# Patient Record
Sex: Male | Born: 1945 | Race: White | Hispanic: No | Smoking: Former smoker
Health system: Southern US, Community
[De-identification: ages and names within clinical notes are randomized; demographics above are authoritative.]

## PROBLEM LIST (undated history)

## (undated) DIAGNOSIS — R140 Abdominal distension (gaseous): Secondary | ICD-10-CM

## (undated) DIAGNOSIS — F329 Major depressive disorder, single episode, unspecified: Secondary | ICD-10-CM

## (undated) DIAGNOSIS — D649 Anemia, unspecified: Secondary | ICD-10-CM

## (undated) DIAGNOSIS — N319 Neuromuscular dysfunction of bladder, unspecified: Secondary | ICD-10-CM

## (undated) DIAGNOSIS — J90 Pleural effusion, not elsewhere classified: Secondary | ICD-10-CM

## (undated) DIAGNOSIS — J189 Pneumonia, unspecified organism: Secondary | ICD-10-CM

## (undated) DIAGNOSIS — F419 Anxiety disorder, unspecified: Secondary | ICD-10-CM

## (undated) DIAGNOSIS — E119 Type 2 diabetes mellitus without complications: Secondary | ICD-10-CM

## (undated) DIAGNOSIS — F4310 Post-traumatic stress disorder, unspecified: Secondary | ICD-10-CM

## (undated) DIAGNOSIS — F32A Depression, unspecified: Secondary | ICD-10-CM

## (undated) DIAGNOSIS — K3184 Gastroparesis: Secondary | ICD-10-CM

## (undated) DIAGNOSIS — G4700 Insomnia, unspecified: Secondary | ICD-10-CM

## (undated) DIAGNOSIS — G92 Toxic encephalopathy: Secondary | ICD-10-CM

## (undated) DIAGNOSIS — J069 Acute upper respiratory infection, unspecified: Secondary | ICD-10-CM

## (undated) DIAGNOSIS — K219 Gastro-esophageal reflux disease without esophagitis: Secondary | ICD-10-CM

## (undated) DIAGNOSIS — G8250 Quadriplegia, unspecified: Secondary | ICD-10-CM

## (undated) DIAGNOSIS — B999 Unspecified infectious disease: Secondary | ICD-10-CM

## (undated) DIAGNOSIS — L8994 Pressure ulcer of unspecified site, stage 4: Secondary | ICD-10-CM

## (undated) DIAGNOSIS — B20 Human immunodeficiency virus [HIV] disease: Secondary | ICD-10-CM

## (undated) DIAGNOSIS — R1312 Dysphagia, oropharyngeal phase: Secondary | ICD-10-CM

## (undated) DIAGNOSIS — E876 Hypokalemia: Secondary | ICD-10-CM

## (undated) DIAGNOSIS — G8929 Other chronic pain: Secondary | ICD-10-CM

## (undated) DIAGNOSIS — Z9911 Dependence on respirator [ventilator] status: Secondary | ICD-10-CM

## (undated) DIAGNOSIS — F4321 Adjustment disorder with depressed mood: Secondary | ICD-10-CM

## (undated) DIAGNOSIS — J9600 Acute respiratory failure, unspecified whether with hypoxia or hypercapnia: Secondary | ICD-10-CM

## (undated) DIAGNOSIS — I10 Essential (primary) hypertension: Secondary | ICD-10-CM

## (undated) DIAGNOSIS — IMO0001 Reserved for inherently not codable concepts without codable children: Secondary | ICD-10-CM

## (undated) DIAGNOSIS — G929 Unspecified toxic encephalopathy: Secondary | ICD-10-CM

## (undated) HISTORY — PX: TRACHEOSTOMY: SUR1362

## (undated) HISTORY — PX: CERVICAL SPINE SURGERY: SHX589

## (undated) HISTORY — PX: GASTROSTOMY: SHX151

---

## 2015-05-23 ENCOUNTER — Inpatient Hospital Stay (HOSPITAL_COMMUNITY): Payer: Medicare Other

## 2015-05-23 ENCOUNTER — Encounter (HOSPITAL_COMMUNITY): Payer: Self-pay | Admitting: Emergency Medicine

## 2015-05-23 ENCOUNTER — Emergency Department (HOSPITAL_COMMUNITY): Payer: Medicare Other

## 2015-05-23 ENCOUNTER — Inpatient Hospital Stay (HOSPITAL_COMMUNITY)
Admission: EM | Admit: 2015-05-23 | Discharge: 2015-06-28 | DRG: 974 | Disposition: A | Discharge status: Skilled Nursing Facility | Payer: Medicare Other | Attending: Internal Medicine | Admitting: Internal Medicine

## 2015-05-23 DIAGNOSIS — J151 Pneumonia due to Pseudomonas: Principal | ICD-10-CM | POA: Diagnosis present

## 2015-05-23 DIAGNOSIS — R0902 Hypoxemia: Secondary | ICD-10-CM

## 2015-05-23 DIAGNOSIS — B965 Pseudomonas (aeruginosa) (mallei) (pseudomallei) as the cause of diseases classified elsewhere: Secondary | ICD-10-CM | POA: Diagnosis not present

## 2015-05-23 DIAGNOSIS — D638 Anemia in other chronic diseases classified elsewhere: Secondary | ICD-10-CM | POA: Insufficient documentation

## 2015-05-23 DIAGNOSIS — J9811 Atelectasis: Secondary | ICD-10-CM | POA: Diagnosis not present

## 2015-05-23 DIAGNOSIS — Y848 Other medical procedures as the cause of abnormal reaction of the patient, or of later complication, without mention of misadventure at the time of the procedure: Secondary | ICD-10-CM | POA: Diagnosis not present

## 2015-05-23 DIAGNOSIS — Z794 Long term (current) use of insulin: Secondary | ICD-10-CM | POA: Diagnosis not present

## 2015-05-23 DIAGNOSIS — I10 Essential (primary) hypertension: Secondary | ICD-10-CM | POA: Diagnosis not present

## 2015-05-23 DIAGNOSIS — K9423 Gastrostomy malfunction: Secondary | ICD-10-CM | POA: Diagnosis not present

## 2015-05-23 DIAGNOSIS — M542 Cervicalgia: Secondary | ICD-10-CM | POA: Insufficient documentation

## 2015-05-23 DIAGNOSIS — J95851 Ventilator associated pneumonia: Secondary | ICD-10-CM | POA: Diagnosis not present

## 2015-05-23 DIAGNOSIS — Z9911 Dependence on respirator [ventilator] status: Secondary | ICD-10-CM | POA: Diagnosis not present

## 2015-05-23 DIAGNOSIS — R0681 Apnea, not elsewhere classified: Secondary | ICD-10-CM | POA: Diagnosis not present

## 2015-05-23 DIAGNOSIS — E46 Unspecified protein-calorie malnutrition: Secondary | ICD-10-CM | POA: Diagnosis not present

## 2015-05-23 DIAGNOSIS — F419 Anxiety disorder, unspecified: Secondary | ICD-10-CM | POA: Diagnosis not present

## 2015-05-23 DIAGNOSIS — R680 Hypothermia, not associated with low environmental temperature: Secondary | ICD-10-CM | POA: Diagnosis not present

## 2015-05-23 DIAGNOSIS — J9601 Acute respiratory failure with hypoxia: Secondary | ICD-10-CM | POA: Diagnosis not present

## 2015-05-23 DIAGNOSIS — M7989 Other specified soft tissue disorders: Secondary | ICD-10-CM | POA: Insufficient documentation

## 2015-05-23 DIAGNOSIS — Z515 Encounter for palliative care: Secondary | ICD-10-CM | POA: Insufficient documentation

## 2015-05-23 DIAGNOSIS — L8990 Pressure ulcer of unspecified site, unspecified stage: Secondary | ICD-10-CM | POA: Insufficient documentation

## 2015-05-23 DIAGNOSIS — R001 Bradycardia, unspecified: Secondary | ICD-10-CM | POA: Diagnosis not present

## 2015-05-23 DIAGNOSIS — R4182 Altered mental status, unspecified: Secondary | ICD-10-CM | POA: Diagnosis present

## 2015-05-23 DIAGNOSIS — L89612 Pressure ulcer of right heel, stage 2: Secondary | ICD-10-CM | POA: Diagnosis present

## 2015-05-23 DIAGNOSIS — I959 Hypotension, unspecified: Secondary | ICD-10-CM | POA: Diagnosis not present

## 2015-05-23 DIAGNOSIS — J158 Pneumonia due to other specified bacteria: Secondary | ICD-10-CM | POA: Diagnosis not present

## 2015-05-23 DIAGNOSIS — Z87891 Personal history of nicotine dependence: Secondary | ICD-10-CM

## 2015-05-23 DIAGNOSIS — E871 Hypo-osmolality and hyponatremia: Secondary | ICD-10-CM | POA: Diagnosis not present

## 2015-05-23 DIAGNOSIS — Z1624 Resistance to multiple antibiotics: Secondary | ICD-10-CM

## 2015-05-23 DIAGNOSIS — F329 Major depressive disorder, single episode, unspecified: Secondary | ICD-10-CM | POA: Diagnosis not present

## 2015-05-23 DIAGNOSIS — J9610 Chronic respiratory failure, unspecified whether with hypoxia or hypercapnia: Secondary | ICD-10-CM | POA: Diagnosis not present

## 2015-05-23 DIAGNOSIS — J9819 Other pulmonary collapse: Secondary | ICD-10-CM | POA: Insufficient documentation

## 2015-05-23 DIAGNOSIS — J9600 Acute respiratory failure, unspecified whether with hypoxia or hypercapnia: Secondary | ICD-10-CM | POA: Insufficient documentation

## 2015-05-23 DIAGNOSIS — Z21 Asymptomatic human immunodeficiency virus [HIV] infection status: Secondary | ICD-10-CM | POA: Diagnosis not present

## 2015-05-23 DIAGNOSIS — E11649 Type 2 diabetes mellitus with hypoglycemia without coma: Secondary | ICD-10-CM | POA: Diagnosis not present

## 2015-05-23 DIAGNOSIS — B20 Human immunodeficiency virus [HIV] disease: Secondary | ICD-10-CM | POA: Diagnosis present

## 2015-05-23 DIAGNOSIS — T17990A Other foreign object in respiratory tract, part unspecified in causing asphyxiation, initial encounter: Secondary | ICD-10-CM | POA: Diagnosis not present

## 2015-05-23 DIAGNOSIS — Z930 Tracheostomy status: Secondary | ICD-10-CM | POA: Diagnosis not present

## 2015-05-23 DIAGNOSIS — J9611 Chronic respiratory failure with hypoxia: Secondary | ICD-10-CM | POA: Diagnosis not present

## 2015-05-23 DIAGNOSIS — T40605A Adverse effect of unspecified narcotics, initial encounter: Secondary | ICD-10-CM | POA: Diagnosis not present

## 2015-05-23 DIAGNOSIS — J90 Pleural effusion, not elsewhere classified: Secondary | ICD-10-CM | POA: Diagnosis not present

## 2015-05-23 DIAGNOSIS — A498 Other bacterial infections of unspecified site: Secondary | ICD-10-CM | POA: Diagnosis not present

## 2015-05-23 DIAGNOSIS — J811 Chronic pulmonary edema: Secondary | ICD-10-CM

## 2015-05-23 DIAGNOSIS — E876 Hypokalemia: Secondary | ICD-10-CM | POA: Diagnosis not present

## 2015-05-23 DIAGNOSIS — J9621 Acute and chronic respiratory failure with hypoxia: Secondary | ICD-10-CM | POA: Diagnosis not present

## 2015-05-23 DIAGNOSIS — N390 Urinary tract infection, site not specified: Secondary | ICD-10-CM | POA: Diagnosis present

## 2015-05-23 DIAGNOSIS — J9690 Respiratory failure, unspecified, unspecified whether with hypoxia or hypercapnia: Secondary | ICD-10-CM | POA: Insufficient documentation

## 2015-05-23 DIAGNOSIS — Y95 Nosocomial condition: Secondary | ICD-10-CM | POA: Diagnosis present

## 2015-05-23 DIAGNOSIS — Z931 Gastrostomy status: Secondary | ICD-10-CM | POA: Diagnosis not present

## 2015-05-23 DIAGNOSIS — L89154 Pressure ulcer of sacral region, stage 4: Secondary | ICD-10-CM | POA: Diagnosis not present

## 2015-05-23 DIAGNOSIS — L89622 Pressure ulcer of left heel, stage 2: Secondary | ICD-10-CM | POA: Diagnosis present

## 2015-05-23 DIAGNOSIS — A419 Sepsis, unspecified organism: Secondary | ICD-10-CM | POA: Diagnosis not present

## 2015-05-23 DIAGNOSIS — G8250 Quadriplegia, unspecified: Secondary | ICD-10-CM | POA: Diagnosis present

## 2015-05-23 DIAGNOSIS — J9620 Acute and chronic respiratory failure, unspecified whether with hypoxia or hypercapnia: Secondary | ICD-10-CM | POA: Diagnosis present

## 2015-05-23 DIAGNOSIS — G8929 Other chronic pain: Secondary | ICD-10-CM | POA: Diagnosis not present

## 2015-05-23 DIAGNOSIS — Z8619 Personal history of other infectious and parasitic diseases: Secondary | ICD-10-CM | POA: Diagnosis not present

## 2015-05-23 DIAGNOSIS — J189 Pneumonia, unspecified organism: Secondary | ICD-10-CM | POA: Diagnosis not present

## 2015-05-23 DIAGNOSIS — M62838 Other muscle spasm: Secondary | ICD-10-CM | POA: Diagnosis not present

## 2015-05-23 DIAGNOSIS — I8291 Chronic embolism and thrombosis of unspecified vein: Secondary | ICD-10-CM | POA: Diagnosis not present

## 2015-05-23 DIAGNOSIS — G92 Toxic encephalopathy: Secondary | ICD-10-CM | POA: Diagnosis present

## 2015-05-23 DIAGNOSIS — G4700 Insomnia, unspecified: Secondary | ICD-10-CM | POA: Diagnosis present

## 2015-05-23 DIAGNOSIS — R0603 Acute respiratory distress: Secondary | ICD-10-CM | POA: Insufficient documentation

## 2015-05-23 DIAGNOSIS — B954 Other streptococcus as the cause of diseases classified elsewhere: Secondary | ICD-10-CM | POA: Diagnosis not present

## 2015-05-23 DIAGNOSIS — R0600 Dyspnea, unspecified: Secondary | ICD-10-CM | POA: Diagnosis not present

## 2015-05-23 HISTORY — DX: Pressure ulcer of unspecified site, stage 4: L89.94

## 2015-05-23 HISTORY — DX: Human immunodeficiency virus (HIV) disease: B20

## 2015-05-23 HISTORY — DX: Essential (primary) hypertension: I10

## 2015-05-23 HISTORY — DX: Type 2 diabetes mellitus without complications: E11.9

## 2015-05-23 HISTORY — DX: Depression, unspecified: F32.A

## 2015-05-23 HISTORY — DX: Major depressive disorder, single episode, unspecified: F32.9

## 2015-05-23 HISTORY — DX: Reserved for inherently not codable concepts without codable children: IMO0001

## 2015-05-23 HISTORY — DX: Gastro-esophageal reflux disease without esophagitis: K21.9

## 2015-05-23 HISTORY — DX: Quadriplegia, unspecified: G82.50

## 2015-05-23 LAB — URINALYSIS, ROUTINE W REFLEX MICROSCOPIC
Bilirubin Urine: NEGATIVE
Glucose, UA: NEGATIVE mg/dL
Ketones, ur: NEGATIVE mg/dL
NITRITE: POSITIVE — AB
PH: 8 (ref 5.0–8.0)
Protein, ur: 100 mg/dL — AB
SPECIFIC GRAVITY, URINE: 1.015 (ref 1.005–1.030)
Urobilinogen, UA: 1 mg/dL (ref 0.0–1.0)

## 2015-05-23 LAB — CBC WITH DIFFERENTIAL/PLATELET
Basophils Absolute: 0 10*3/uL (ref 0.0–0.1)
Basophils Relative: 0 %
EOS PCT: 2 %
Eosinophils Absolute: 0.2 10*3/uL (ref 0.0–0.7)
HEMATOCRIT: 32.7 % — AB (ref 39.0–52.0)
HEMOGLOBIN: 9.3 g/dL — AB (ref 13.0–17.0)
LYMPHS ABS: 0.9 10*3/uL (ref 0.7–4.0)
LYMPHS PCT: 8 %
MCH: 25.2 pg — ABNORMAL LOW (ref 26.0–34.0)
MCHC: 28.4 g/dL — ABNORMAL LOW (ref 30.0–36.0)
MCV: 88.6 fL (ref 78.0–100.0)
MONOS PCT: 6 %
Monocytes Absolute: 0.7 10*3/uL (ref 0.1–1.0)
NEUTROS PCT: 84 %
Neutro Abs: 9.6 10*3/uL — ABNORMAL HIGH (ref 1.7–7.7)
Platelets: 275 10*3/uL (ref 150–400)
RBC: 3.69 MIL/uL — AB (ref 4.22–5.81)
RDW: 15.1 % (ref 11.5–15.5)
WBC: 11.4 10*3/uL — AB (ref 4.0–10.5)

## 2015-05-23 LAB — COMPREHENSIVE METABOLIC PANEL
ALT: 15 U/L — ABNORMAL LOW (ref 17–63)
AST: 15 U/L (ref 15–41)
Albumin: 2.7 g/dL — ABNORMAL LOW (ref 3.5–5.0)
Alkaline Phosphatase: 57 U/L (ref 38–126)
Anion gap: 11 (ref 5–15)
BUN: 23 mg/dL — AB (ref 6–20)
CHLORIDE: 101 mmol/L (ref 101–111)
CO2: 31 mmol/L (ref 22–32)
Calcium: 9.8 mg/dL (ref 8.9–10.3)
Creatinine, Ser: 0.84 mg/dL (ref 0.61–1.24)
GFR calc Af Amer: >60 mL/min (ref >60)
Glucose, Bld: 121 mg/dL — ABNORMAL HIGH (ref 65–99)
POTASSIUM: 3.5 mmol/L (ref 3.5–5.1)
Sodium: 143 mmol/L (ref 135–145)
Total Bilirubin: 0.4 mg/dL (ref 0.3–1.2)
Total Protein: 6.5 g/dL (ref 6.5–8.1)

## 2015-05-23 LAB — GLUCOSE, CAPILLARY
GLUCOSE-CAPILLARY: 150 mg/dL — AB (ref 65–99)
GLUCOSE-CAPILLARY: 140 mg/dL — AB (ref 65–99)

## 2015-05-23 LAB — MRSA PCR SCREENING: MRSA by PCR: NEGATIVE

## 2015-05-23 LAB — URINE MICROSCOPIC-ADD ON

## 2015-05-23 LAB — PROCALCITONIN: Procalcitonin: 0.3 ng/mL

## 2015-05-23 LAB — TROPONIN I

## 2015-05-23 LAB — I-STAT CG4 LACTIC ACID, ED: LACTIC ACID, VENOUS: 1.52 mmol/L (ref 0.5–2.0)

## 2015-05-23 MED ORDER — VANCOMYCIN HCL IN DEXTROSE 1-5 GM/200ML-% IV SOLN
1000.0000 mg | Freq: Once | INTRAVENOUS | Status: AC
  Administered 2015-05-23: 1000 mg via INTRAVENOUS
  Filled 2015-05-23: qty 200

## 2015-05-23 MED ORDER — CHLORHEXIDINE GLUCONATE 0.12% ORAL RINSE (MEDLINE KIT)
15.0000 mL | Freq: Two times a day (BID) | OROMUCOSAL | Status: DC
  Administered 2015-05-24 – 2015-06-27 (×66): 15 mL via OROMUCOSAL
  Filled 2015-05-23: qty 15

## 2015-05-23 MED ORDER — PIPERACILLIN-TAZOBACTAM 3.375 G IVPB
3.3750 g | Freq: Three times a day (TID) | INTRAVENOUS | Status: DC
  Administered 2015-05-23 – 2015-05-25 (×6): 3.375 g via INTRAVENOUS
  Filled 2015-05-23 (×8): qty 50

## 2015-05-23 MED ORDER — PANTOPRAZOLE SODIUM 40 MG IV SOLR
40.0000 mg | INTRAVENOUS | Status: DC
  Administered 2015-05-23: 40 mg via INTRAVENOUS
  Filled 2015-05-23 (×2): qty 40

## 2015-05-23 MED ORDER — SODIUM CHLORIDE 0.9 % IV SOLN
1000.0000 mL | INTRAVENOUS | Status: DC
  Administered 2015-05-23: 1000 mL via INTRAVENOUS

## 2015-05-23 MED ORDER — HEPARIN SODIUM (PORCINE) 5000 UNIT/ML IJ SOLN
5000.0000 [IU] | Freq: Three times a day (TID) | INTRAMUSCULAR | Status: DC
  Administered 2015-05-23 – 2015-06-28 (×108): 5000 [IU] via SUBCUTANEOUS
  Filled 2015-05-23 (×108): qty 1

## 2015-05-23 MED ORDER — IOHEXOL 300 MG/ML  SOLN: 50.0000 mL | Freq: Once | INTRAMUSCULAR | Status: DC | PRN

## 2015-05-23 MED ORDER — ANTISEPTIC ORAL RINSE SOLUTION (CORINZ): 7.0000 mL | OROMUCOSAL | Status: DC

## 2015-05-23 MED ORDER — ALBUTEROL SULFATE (2.5 MG/3ML) 0.083% IN NEBU
2.5000 mg | INHALATION_SOLUTION | RESPIRATORY_TRACT | Status: DC | PRN
  Administered 2015-05-24 – 2015-06-21 (×16): 2.5 mg via RESPIRATORY_TRACT
  Filled 2015-05-23 (×17): qty 3

## 2015-05-23 MED ORDER — ACETAMINOPHEN 325 MG PO TABS
650.0000 mg | ORAL_TABLET | ORAL | Status: DC | PRN
  Administered 2015-05-23 – 2015-06-01 (×3): 650 mg
  Filled 2015-05-23 (×4): qty 2

## 2015-05-23 MED ORDER — ANTISEPTIC ORAL RINSE SOLUTION (CORINZ)
7.0000 mL | Freq: Four times a day (QID) | OROMUCOSAL | Status: DC
  Administered 2015-05-23 – 2015-06-28 (×128): 7 mL via OROMUCOSAL

## 2015-05-23 MED ORDER — FENTANYL CITRATE (PF) 100 MCG/2ML IJ SOLN
50.0000 ug | INTRAMUSCULAR | Status: DC | PRN
  Administered 2015-05-24 – 2015-05-30 (×35): 50 ug via INTRAVENOUS
  Filled 2015-05-23 (×35): qty 2

## 2015-05-23 MED ORDER — MIDAZOLAM HCL 2 MG/2ML IJ SOLN
1.0000 mg | INTRAMUSCULAR | Status: DC | PRN
  Administered 2015-05-24 – 2015-05-26 (×9): 1 mg via INTRAVENOUS
  Filled 2015-05-23 (×9): qty 2

## 2015-05-23 MED ORDER — PIPERACILLIN-TAZOBACTAM 3.375 G IVPB 30 MIN
3.3750 g | Freq: Once | INTRAVENOUS | Status: AC
  Administered 2015-05-23: 3.375 g via INTRAVENOUS
  Filled 2015-05-23: qty 50

## 2015-05-23 MED ORDER — CHLORHEXIDINE GLUCONATE 0.12% ORAL RINSE (MEDLINE KIT)
15.0000 mL | Freq: Two times a day (BID) | OROMUCOSAL | Status: DC
  Administered 2015-05-23: 15 mL via OROMUCOSAL

## 2015-05-23 MED ORDER — INSULIN ASPART 100 UNIT/ML ~~LOC~~ SOLN
0.0000 [IU] | SUBCUTANEOUS | Status: DC
  Administered 2015-05-23 – 2015-05-24 (×3): 2 [IU] via SUBCUTANEOUS
  Administered 2015-05-24 – 2015-05-26 (×7): 3 [IU] via SUBCUTANEOUS
  Administered 2015-05-26: 5 [IU] via SUBCUTANEOUS
  Administered 2015-05-26: 3 [IU] via SUBCUTANEOUS
  Administered 2015-05-26: 2 [IU] via SUBCUTANEOUS
  Administered 2015-05-26: 3 [IU] via SUBCUTANEOUS
  Administered 2015-05-26: 5 [IU] via SUBCUTANEOUS
  Administered 2015-05-27 (×4): 3 [IU] via SUBCUTANEOUS
  Administered 2015-05-27: 5 [IU] via SUBCUTANEOUS
  Administered 2015-05-28: 3 [IU] via SUBCUTANEOUS
  Administered 2015-05-28: 5 [IU] via SUBCUTANEOUS
  Administered 2015-05-28 (×2): 3 [IU] via SUBCUTANEOUS
  Administered 2015-05-28: 2 [IU] via SUBCUTANEOUS
  Administered 2015-05-29 (×3): 3 [IU] via SUBCUTANEOUS
  Administered 2015-05-29: 2 [IU] via SUBCUTANEOUS
  Administered 2015-05-29: 3 [IU] via SUBCUTANEOUS
  Administered 2015-05-29: 5 [IU] via SUBCUTANEOUS
  Administered 2015-05-30 (×5): 3 [IU] via SUBCUTANEOUS
  Administered 2015-05-30: 5 [IU] via SUBCUTANEOUS
  Administered 2015-05-31: 2 [IU] via SUBCUTANEOUS
  Administered 2015-05-31: 3 [IU] via SUBCUTANEOUS
  Administered 2015-05-31: 5 [IU] via SUBCUTANEOUS
  Administered 2015-05-31: 2 [IU] via SUBCUTANEOUS
  Administered 2015-05-31: 3 [IU] via SUBCUTANEOUS
  Administered 2015-06-01: 5 [IU] via SUBCUTANEOUS
  Administered 2015-06-01 (×3): 3 [IU] via SUBCUTANEOUS
  Administered 2015-06-01: 5 [IU] via SUBCUTANEOUS
  Administered 2015-06-01 (×2): 3 [IU] via SUBCUTANEOUS
  Administered 2015-06-02: 2 [IU] via SUBCUTANEOUS
  Administered 2015-06-02: 5 [IU] via SUBCUTANEOUS
  Administered 2015-06-02: 3 [IU] via SUBCUTANEOUS
  Administered 2015-06-02: 5 [IU] via SUBCUTANEOUS
  Administered 2015-06-02 – 2015-06-04 (×11): 3 [IU] via SUBCUTANEOUS
  Administered 2015-06-04 – 2015-06-05 (×2): 2 [IU] via SUBCUTANEOUS
  Administered 2015-06-05 (×4): 3 [IU] via SUBCUTANEOUS
  Administered 2015-06-06: 2 [IU] via SUBCUTANEOUS
  Administered 2015-06-06 (×2): 3 [IU] via SUBCUTANEOUS
  Administered 2015-06-06: 2 [IU] via SUBCUTANEOUS
  Administered 2015-06-06: 3 [IU] via SUBCUTANEOUS
  Administered 2015-06-07 (×6): 5 [IU] via SUBCUTANEOUS
  Administered 2015-06-08: 0 [IU] via SUBCUTANEOUS
  Administered 2015-06-08: 5 [IU] via SUBCUTANEOUS
  Administered 2015-06-08 (×2): 3 [IU] via SUBCUTANEOUS
  Administered 2015-06-08: 0 [IU] via SUBCUTANEOUS
  Administered 2015-06-08: 2 [IU] via SUBCUTANEOUS
  Administered 2015-06-08: 3 [IU] via SUBCUTANEOUS
  Administered 2015-06-09 (×3): 5 [IU] via SUBCUTANEOUS
  Administered 2015-06-09: 3 [IU] via SUBCUTANEOUS
  Administered 2015-06-09: 5 [IU] via SUBCUTANEOUS
  Administered 2015-06-10: 3 [IU] via SUBCUTANEOUS
  Administered 2015-06-10: 5 [IU] via SUBCUTANEOUS
  Administered 2015-06-10 (×2): 3 [IU] via SUBCUTANEOUS
  Administered 2015-06-10 (×2): 5 [IU] via SUBCUTANEOUS
  Administered 2015-06-11 (×3): 3 [IU] via SUBCUTANEOUS
  Administered 2015-06-11: 5 [IU] via SUBCUTANEOUS
  Administered 2015-06-11 (×2): 3 [IU] via SUBCUTANEOUS
  Administered 2015-06-12 (×2): 8 [IU] via SUBCUTANEOUS
  Administered 2015-06-12: 3 [IU] via SUBCUTANEOUS
  Administered 2015-06-12: 8 [IU] via SUBCUTANEOUS
  Administered 2015-06-12 – 2015-06-13 (×4): 5 [IU] via SUBCUTANEOUS
  Administered 2015-06-13: 3 [IU] via SUBCUTANEOUS
  Administered 2015-06-13 – 2015-06-14 (×8): 5 [IU] via SUBCUTANEOUS
  Administered 2015-06-15: 3 [IU] via SUBCUTANEOUS
  Administered 2015-06-15: 8 [IU] via SUBCUTANEOUS
  Administered 2015-06-15 (×2): 3 [IU] via SUBCUTANEOUS
  Administered 2015-06-15: 8 [IU] via SUBCUTANEOUS
  Administered 2015-06-15: 3 [IU] via SUBCUTANEOUS
  Administered 2015-06-15: 5 [IU] via SUBCUTANEOUS
  Administered 2015-06-16: 3 [IU] via SUBCUTANEOUS
  Administered 2015-06-16: 2 [IU] via SUBCUTANEOUS
  Administered 2015-06-16 (×2): 3 [IU] via SUBCUTANEOUS
  Administered 2015-06-16: 5 [IU] via SUBCUTANEOUS
  Administered 2015-06-17: 2 [IU] via SUBCUTANEOUS
  Administered 2015-06-17: 3 [IU] via SUBCUTANEOUS
  Administered 2015-06-17: 2 [IU] via SUBCUTANEOUS
  Administered 2015-06-17: 5 [IU] via SUBCUTANEOUS
  Administered 2015-06-17 (×2): 2 [IU] via SUBCUTANEOUS
  Administered 2015-06-18: 3 [IU] via SUBCUTANEOUS
  Administered 2015-06-18: 2 [IU] via SUBCUTANEOUS
  Administered 2015-06-18 (×2): 5 [IU] via SUBCUTANEOUS
  Administered 2015-06-19 (×2): 2 [IU] via SUBCUTANEOUS
  Administered 2015-06-19: 3 [IU] via SUBCUTANEOUS
  Administered 2015-06-19: 2 [IU] via SUBCUTANEOUS
  Administered 2015-06-19: 3 [IU] via SUBCUTANEOUS
  Administered 2015-06-19: 5 [IU] via SUBCUTANEOUS
  Administered 2015-06-20 (×6): 3 [IU] via SUBCUTANEOUS
  Administered 2015-06-21: 0 [IU] via SUBCUTANEOUS
  Administered 2015-06-21 (×2): 3 [IU] via SUBCUTANEOUS
  Administered 2015-06-21 – 2015-06-22 (×2): 0 [IU] via SUBCUTANEOUS
  Administered 2015-06-22: 2 [IU] via SUBCUTANEOUS
  Administered 2015-06-22: 3 [IU] via SUBCUTANEOUS
  Administered 2015-06-22: 2 [IU] via SUBCUTANEOUS
  Administered 2015-06-22 – 2015-06-23 (×3): 3 [IU] via SUBCUTANEOUS
  Administered 2015-06-23: 2 [IU] via SUBCUTANEOUS
  Administered 2015-06-23 (×2): 3 [IU] via SUBCUTANEOUS
  Administered 2015-06-23 – 2015-06-24 (×2): 2 [IU] via SUBCUTANEOUS
  Administered 2015-06-24 – 2015-06-25 (×4): 3 [IU] via SUBCUTANEOUS
  Administered 2015-06-25: 2 [IU] via SUBCUTANEOUS
  Administered 2015-06-26: 3 [IU] via SUBCUTANEOUS
  Administered 2015-06-26 – 2015-06-27 (×6): 2 [IU] via SUBCUTANEOUS
  Administered 2015-06-27: 3 [IU] via SUBCUTANEOUS
  Administered 2015-06-28: 2 [IU] via SUBCUTANEOUS
  Administered 2015-06-28: 3 [IU] via SUBCUTANEOUS

## 2015-05-23 MED ORDER — SODIUM CHLORIDE 0.9 % IV SOLN
INTRAVENOUS | Status: DC
  Administered 2015-05-23 – 2015-05-24 (×2): via INTRAVENOUS
  Administered 2015-06-04: 10 mL/h via INTRAVENOUS
  Administered 2015-06-06: 05:00:00 via INTRAVENOUS
  Administered 2015-06-08: 1000 mL via INTRAVENOUS
  Administered 2015-06-11: 10 mL/h via INTRAVENOUS
  Administered 2015-06-14: 22:00:00 via INTRAVENOUS
  Administered 2015-06-20: 250 mL via INTRAVENOUS
  Administered 2015-06-23 – 2015-06-27 (×4): via INTRAVENOUS

## 2015-05-23 MED ORDER — VANCOMYCIN HCL IN DEXTROSE 750-5 MG/150ML-% IV SOLN
750.0000 mg | Freq: Three times a day (TID) | INTRAVENOUS | Status: DC
  Administered 2015-05-23 – 2015-05-24 (×4): 750 mg via INTRAVENOUS
  Filled 2015-05-23 (×8): qty 150

## 2015-05-23 NOTE — ED Notes (Signed)
Pt here from [facility] , EMS called out for unresponsive pt received  narcan from ems and came around to his baseline , pt has a trach at baseline and a quadriplegic
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
## 2015-05-23 NOTE — ED Notes (Signed)
Attempted report 

## 2015-05-23 NOTE — Progress Notes (Signed)
Pt's temp 102.5, Dr. Bard Herbert called.

## 2015-05-24 ENCOUNTER — Inpatient Hospital Stay (HOSPITAL_COMMUNITY): Payer: Medicare Other

## 2015-05-24 DIAGNOSIS — L8990 Pressure ulcer of unspecified site, unspecified stage: Secondary | ICD-10-CM

## 2015-05-24 LAB — COMPREHENSIVE METABOLIC PANEL
ALBUMIN: 2.5 g/dL — AB (ref 3.5–5.0)
ALK PHOS: 65 U/L (ref 38–126)
ALT: 17 U/L (ref 17–63)
AST: 15 U/L (ref 15–41)
Anion gap: 12 (ref 5–15)
BILIRUBIN TOTAL: 0.6 mg/dL (ref 0.3–1.2)
BUN: 20 mg/dL (ref 6–20)
CALCIUM: 9.1 mg/dL (ref 8.9–10.3)
CO2: 26 mmol/L (ref 22–32)
CREATININE: 0.8 mg/dL (ref 0.61–1.24)
Chloride: 102 mmol/L (ref 101–111)
GFR calc Af Amer: >60 mL/min (ref >60)
GFR calc non Af Amer: >60 mL/min (ref >60)
GLUCOSE: 100 mg/dL — AB (ref 65–99)
Potassium: 2.5 mmol/L — CL (ref 3.5–5.1)
SODIUM: 140 mmol/L (ref 135–145)
TOTAL PROTEIN: 6.4 g/dL — AB (ref 6.5–8.1)

## 2015-05-24 LAB — CBC
HCT: 29.2 % — ABNORMAL LOW (ref 39.0–52.0)
Hemoglobin: 8.5 g/dL — ABNORMAL LOW (ref 13.0–17.0)
MCH: 24.9 pg — AB (ref 26.0–34.0)
MCHC: 29.1 g/dL — AB (ref 30.0–36.0)
MCV: 85.6 fL (ref 78.0–100.0)
Platelets: 278 10*3/uL (ref 150–400)
RBC: 3.41 MIL/uL — ABNORMAL LOW (ref 4.22–5.81)
RDW: 15 % (ref 11.5–15.5)
WBC: 11.9 10*3/uL — ABNORMAL HIGH (ref 4.0–10.5)

## 2015-05-24 LAB — GLUCOSE, CAPILLARY
GLUCOSE-CAPILLARY: 120 mg/dL — AB (ref 65–99)
GLUCOSE-CAPILLARY: 121 mg/dL — AB (ref 65–99)
Glucose-Capillary: 88 mg/dL (ref 65–99)
Glucose-Capillary: 107 mg/dL — ABNORMAL HIGH (ref 65–99)
Glucose-Capillary: 159 mg/dL — ABNORMAL HIGH (ref 65–99)

## 2015-05-24 LAB — BASIC METABOLIC PANEL
Anion gap: 9 (ref 5–15)
BUN: 22 mg/dL — AB (ref 6–20)
CHLORIDE: 104 mmol/L (ref 101–111)
CO2: 26 mmol/L (ref 22–32)
Calcium: 9 mg/dL (ref 8.9–10.3)
Creatinine, Ser: 0.86 mg/dL (ref 0.61–1.24)
GFR calc Af Amer: >60 mL/min (ref >60)
GFR calc non Af Amer: >60 mL/min (ref >60)
GLUCOSE: 123 mg/dL — AB (ref 65–99)
POTASSIUM: 3.3 mmol/L — AB (ref 3.5–5.1)
Sodium: 139 mmol/L (ref 135–145)

## 2015-05-24 LAB — PROCALCITONIN: Procalcitonin: 0.28 ng/mL

## 2015-05-24 MED ORDER — CLONAZEPAM 0.1 MG/ML ORAL SUSPENSION
0.5000 mg | Freq: Every day | ORAL | Status: DC
  Filled 2015-05-24: qty 5

## 2015-05-24 MED ORDER — CLONAZEPAM 0.5 MG PO TABS
0.5000 mg | ORAL_TABLET | Freq: Every day | ORAL | Status: DC
  Administered 2015-05-24 – 2015-05-28 (×5): 0.5 mg
  Filled 2015-05-24 (×5): qty 1

## 2015-05-24 MED ORDER — INFLUENZA VAC SPLIT QUAD 0.5 ML IM SUSY
0.5000 mL | PREFILLED_SYRINGE | INTRAMUSCULAR | Status: AC
  Administered 2015-05-25: 0.5 mL via INTRAMUSCULAR
  Filled 2015-05-24: qty 0.5

## 2015-05-24 MED ORDER — BISACODYL 10 MG RE SUPP: 10.0000 mg | Freq: Every day | RECTAL | Status: DC | PRN

## 2015-05-24 MED ORDER — VENLAFAXINE HCL ER 37.5 MG PO CP24
37.5000 mg | ORAL_CAPSULE | Freq: Every day | ORAL | Status: DC
  Administered 2015-05-25 – 2015-06-11 (×18): 37.5 mg via ORAL
  Filled 2015-05-24 (×21): qty 1

## 2015-05-24 MED ORDER — VITAMIN C 500 MG/5ML PO SYRP
500.0000 mg | ORAL_SOLUTION | Freq: Every day | ORAL | Status: DC
  Administered 2015-05-24 – 2015-06-28 (×36): 500 mg
  Filled 2015-05-24 (×36): qty 5

## 2015-05-24 MED ORDER — EMTRICITABINE-TENOFOVIR AF 200-25 MG PO TABS
1.0000 | ORAL_TABLET | Freq: Every day | ORAL | Status: DC
  Administered 2015-05-24 – 2015-06-28 (×36): 1 via ORAL
  Filled 2015-05-24 (×38): qty 1

## 2015-05-24 MED ORDER — PANTOPRAZOLE SODIUM 40 MG PO PACK
40.0000 mg | PACK | ORAL | Status: DC
  Administered 2015-05-24 – 2015-06-28 (×36): 40 mg
  Filled 2015-05-24 (×30): qty 20

## 2015-05-24 MED ORDER — PRO-STAT SUGAR FREE PO LIQD
30.0000 mL | Freq: Two times a day (BID) | ORAL | Status: AC
  Administered 2015-05-24: 30 mL
  Filled 2015-05-24 (×3): qty 30

## 2015-05-24 MED ORDER — DOLUTEGRAVIR SODIUM 50 MG PO TABS
50.0000 mg | ORAL_TABLET | Freq: Every day | ORAL | Status: DC
  Administered 2015-05-24 – 2015-06-28 (×36): 50 mg via ORAL
  Filled 2015-05-24 (×36): qty 1

## 2015-05-24 MED ORDER — QUETIAPINE FUMARATE 25 MG PO TABS
25.0000 mg | ORAL_TABLET | Freq: Every day | ORAL | Status: DC
  Administered 2015-05-24 – 2015-06-21 (×29): 25 mg via ORAL
  Filled 2015-05-24 (×30): qty 1

## 2015-05-24 MED ORDER — PNEUMOCOCCAL VAC POLYVALENT 25 MCG/0.5ML IJ INJ
0.5000 mL | INJECTION | INTRAMUSCULAR | Status: AC
  Administered 2015-05-25: 0.5 mL via INTRAMUSCULAR

## 2015-05-24 MED ORDER — PROPANTHELINE BROMIDE 15 MG PO TABS
15.0000 mg | ORAL_TABLET | Freq: Three times a day (TID) | ORAL | Status: DC
  Administered 2015-05-24 – 2015-06-28 (×138): 15 mg via ORAL
  Filled 2015-05-24 (×150): qty 1

## 2015-05-24 MED ORDER — VITAL HIGH PROTEIN PO LIQD
1000.0000 mL | ORAL | Status: DC
Start: 2015-05-24 — End: 2015-05-24
  Administered 2015-05-24: 1000 mL
  Filled 2015-05-24 (×2): qty 1000

## 2015-05-24 MED ORDER — VITAL AF 1.2 CAL PO LIQD
1000.0000 mL | ORAL | Status: DC
  Administered 2015-05-24 – 2015-06-28 (×48): 1000 mL
  Filled 2015-05-24 (×54): qty 1000
  Filled 2015-05-24: qty 237
  Filled 2015-05-24 (×17): qty 1000

## 2015-05-24 MED ORDER — ADULT MULTIVITAMIN LIQUID CH
5.0000 mL | Freq: Every day | ORAL | Status: DC
  Administered 2015-05-24 – 2015-06-27 (×34): 5 mL
  Filled 2015-05-24 (×39): qty 5

## 2015-05-24 MED ORDER — BACLOFEN 1 MG/ML ORAL SUSPENSION
10.0000 mg | Freq: Three times a day (TID) | ORAL | Status: DC
Start: 2015-05-24 — End: 2015-05-28
  Administered 2015-05-24 – 2015-05-28 (×13): 10 mg
  Filled 2015-05-24 (×17): qty 1

## 2015-05-24 MED ORDER — COLLAGENASE 250 UNIT/GM EX OINT
TOPICAL_OINTMENT | Freq: Every day | CUTANEOUS | Status: DC
  Administered 2015-05-25 – 2015-06-07 (×14): via TOPICAL
  Administered 2015-06-08: 1 via TOPICAL
  Administered 2015-06-09 – 2015-06-18 (×10): via TOPICAL
  Administered 2015-06-19: 1 via TOPICAL
  Administered 2015-06-20 – 2015-06-21 (×2): via TOPICAL
  Administered 2015-06-22: 1 via TOPICAL
  Administered 2015-06-24 – 2015-06-27 (×4): via TOPICAL
  Filled 2015-05-24 (×4): qty 30

## 2015-05-24 MED ORDER — FUROSEMIDE 10 MG/ML PO SOLN
20.0000 mg | Freq: Every day | ORAL | Status: DC
  Administered 2015-05-24 – 2015-05-29 (×6): 20 mg
  Filled 2015-05-24 (×7): qty 2

## 2015-05-24 MED ORDER — LISINOPRIL 10 MG PO TABS
10.0000 mg | ORAL_TABLET | Freq: Every day | ORAL | Status: DC
  Administered 2015-05-24 – 2015-05-28 (×5): 10 mg
  Filled 2015-05-24 (×5): qty 1

## 2015-05-24 MED ORDER — POTASSIUM CHLORIDE 20 MEQ/15ML (10%) PO SOLN
40.0000 meq | ORAL | Status: AC
  Administered 2015-05-24 (×3): 40 meq
  Filled 2015-05-24 (×3): qty 30

## 2015-05-24 MED ORDER — SENNOSIDES 8.8 MG/5ML PO SYRP
10.0000 mL | ORAL_SOLUTION | Freq: Two times a day (BID) | ORAL | Status: DC
  Administered 2015-05-24 – 2015-06-28 (×52): 10 mL
  Filled 2015-05-24 (×74): qty 10

## 2015-05-24 NOTE — Progress Notes (Signed)
   05/24/15 1300  Clinical Encounter Type  Visited With Family  Visit Type Initial  Referral From Nurse  Stress Factors  Family Stress Factors Other (Comment) (Patients health)  Chaplain spoke with brother of Pt; Pt brother needed emotional support; Chaplain provided; Chaplain upon request as needed
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
## 2015-05-24 NOTE — Progress Notes (Signed)
CRITICAL VALUE ALERT  Critical value received:  K+ 2.5  Date of notification:  05/24/2015  Time of notification:  0605  Critical value read back:Yes.    Nurse who received alert:  Lillia Corporal  MD notified (1st page):  Dr. Vassie Loll  Time of first page:  0615  MD notified (2nd page):  Time of second page:  Responding MD:  Dr. Vassie Loll  Time MD responded:  [phone]
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
## 2015-05-24 NOTE — Progress Notes (Signed)
eLink Physician-Brief Progress Note Patient Name: Anthony Avila DOB: November 21, 1945 MRN: [id number]   Date of Service  05/24/2015  HPI/Events of Note  Bedside RN calling eMD  - patient feels ydyspneic but on camera care - looks well. No distress. On monitorb/ hemodynamics normal. On vent wave form looks normal  - RN says chest sounds "junky"  eICU Interventions  cxr stat Alb neb stat     Intervention Category Major Interventions: Other:  Boyd [place] 05/24/2015, 8:54 PM

## 2015-05-24 NOTE — Progress Notes (Signed)
Utilization review completed. Lashaun Krapf, RN, BSN. 

## 2015-05-24 NOTE — Progress Notes (Signed)
[facility] ADULT ICU REPLACEMENT PROTOCOL FOR AM LAB REPLACEMENT ONLY  The patient does apply for the [facility] Adult ICU Electrolyte Replacment Protocol based on the criteria listed below:   1. Is GFR >/= 40 ml/min? Yes.    Patient's GFR today is >60 2. Is urine output >/= 0.5 ml/kg/hr for the last 6 hours? Yes.   Patient's UOP is 0.51 ml/kg/hr 3. Is BUN < 60 mg/dL? Yes.    Patient's BUN today is 20 4. Abnormal electrolyte(s): Potassium, 2.5 5. Ordered repletion with: [facility] adult ICU replacement protocol 6. If a panic level lab has been reported, has the CCM MD in charge been notified? Yes.  .   Physician:  Dr. Cyril Mourning  [facility], Alda Berthold E 05/24/2015 6:25 AM

## 2015-05-24 NOTE — Care Management Note (Signed)
Case Management Note  Patient Details  Name: Anthony Avila MRN: [id number] Date of Birth: [birth year]  Subjective/Objective:          altered mental status, unstageable sacral wound, and HCAP          Action/Plan: Pt transferred from [facility]. NCM spoke to pt and brother, Anthony Avila # [phone] in the room. Pt gave permission to speak to son, Anthony Avila # [phone]. Pt states he does not want to go back to [facility]. Brother states they are not satisfied with services provided by [facility] facility. NCM contacted son, Anthony Avila. Son prefers Physiological scientist in [place]. NCM contacted [facility] rep, Judie Grieve and he will complete assessment for [facility] and referral for [facility]. Explained son, Anthony Avila is pt's contact.   Expected Discharge Date:                  Expected Discharge Plan:  Long Term Acute Care (LTAC)  In-House Referral:     Discharge planning Services  CM Consult   Status of Service:  In process, will continue to follow  Medicare Important Message Given:    Date Medicare IM Given:    Medicare IM give by:    Date Additional Medicare IM Given:    Additional Medicare Important Message give by:     If discussed at Long Length of Stay Meetings, dates discussed:    Additional Comments:  Elliot Cousin, RN 05/24/2015, 3:39 PM

## 2015-05-24 NOTE — Consult Note (Signed)
WOC wound consult note Reason for Consult: Consult requested for sacrum and left heel. Wound type: Left heel with 2 pressure injuries, present on admission. .3X.3X.1cm, stage 2 wound red and moist with small amt pink drainage. .2X.2X.1cm dark reddish purple deep tissue injury Sacrum with stage 4 pressure injury; 90% loose yellow slough, 10% red. Mod amt thick tan drainage with some odor. Pressure Ulcer POA: Yes Measurement: Sacrum 10X8X2cm with 2 cm of undermining to wound edges.   Conservative sharp wound debridement (CSWD performed at the bedside): Removed loose outer layer of slough using scissors and forceps, revealing another layer of tightly adhered slough and bone is palpable.  Pt tolerated without pain or bleeding.   Dressing procedure/placement/frequency: Sport low airloss bed in place to reduce pressure.  Prevalon boot to reduce pressure to left heel and foam dressing to protect from further injury. Nutrition consult to optimize protein intake. Physical therapy to begin hydrotherapy to assist with removal of nonviable tissue.  Santyl ointment for chemical debridement. Discussed plan of care with patient who mouths words.  No family present to discuss plan of care.  Optimal plan of care would be to continue hydrotherapy at another facility after discharge.  One hour spent performing this consult. Please re-consult if further assistance is needed.  Thank-you,  Cammie Mcgee MSN, RN, CWOCN, [place], CNS [phone]

## 2015-05-25 DIAGNOSIS — Z9911 Dependence on respirator [ventilator] status: Secondary | ICD-10-CM

## 2015-05-25 DIAGNOSIS — Z930 Tracheostomy status: Secondary | ICD-10-CM

## 2015-05-25 DIAGNOSIS — J151 Pneumonia due to Pseudomonas: Secondary | ICD-10-CM | POA: Diagnosis not present

## 2015-05-25 DIAGNOSIS — B20 Human immunodeficiency virus [HIV] disease: Secondary | ICD-10-CM

## 2015-05-25 DIAGNOSIS — G8250 Quadriplegia, unspecified: Secondary | ICD-10-CM

## 2015-05-25 LAB — BASIC METABOLIC PANEL
Anion gap: 13 (ref 5–15)
BUN: 21 mg/dL — AB (ref 6–20)
CALCIUM: 9 mg/dL (ref 8.9–10.3)
CHLORIDE: 109 mmol/L (ref 101–111)
CO2: 21 mmol/L — ABNORMAL LOW (ref 22–32)
CREATININE: 0.85 mg/dL (ref 0.61–1.24)
GFR calc non Af Amer: >60 mL/min (ref >60)
Glucose, Bld: 118 mg/dL — ABNORMAL HIGH (ref 65–99)
Potassium: 3.2 mmol/L — ABNORMAL LOW (ref 3.5–5.1)
SODIUM: 143 mmol/L (ref 135–145)

## 2015-05-25 LAB — URINE CULTURE

## 2015-05-25 LAB — CBC
HCT: 28.4 % — ABNORMAL LOW (ref 39.0–52.0)
Hemoglobin: 8.6 g/dL — ABNORMAL LOW (ref 13.0–17.0)
MCH: 25.7 pg — ABNORMAL LOW (ref 26.0–34.0)
MCHC: 30.3 g/dL (ref 30.0–36.0)
MCV: 84.8 fL (ref 78.0–100.0)
PLATELETS: 317 10*3/uL (ref 150–400)
RBC: 3.35 MIL/uL — AB (ref 4.22–5.81)
RDW: 15.3 % (ref 11.5–15.5)
WBC: 10.6 10*3/uL — AB (ref 4.0–10.5)

## 2015-05-25 LAB — GLUCOSE, CAPILLARY
GLUCOSE-CAPILLARY: 112 mg/dL — AB (ref 65–99)
GLUCOSE-CAPILLARY: 165 mg/dL — AB (ref 65–99)
GLUCOSE-CAPILLARY: 165 mg/dL — AB (ref 65–99)
GLUCOSE-CAPILLARY: 184 mg/dL — AB (ref 65–99)
Glucose-Capillary: 123 mg/dL — ABNORMAL HIGH (ref 65–99)
Glucose-Capillary: 104 mg/dL — ABNORMAL HIGH (ref 65–99)
Glucose-Capillary: 171 mg/dL — ABNORMAL HIGH (ref 65–99)
Glucose-Capillary: 184 mg/dL — ABNORMAL HIGH (ref 65–99)

## 2015-05-25 LAB — PROCALCITONIN: PROCALCITONIN: 0.17 ng/mL

## 2015-05-25 LAB — VANCOMYCIN, TROUGH: VANCOMYCIN TR: 26 ug/mL — AB (ref 10.0–20.0)

## 2015-05-25 MED ORDER — AMLODIPINE 1 MG/ML ORAL SUSPENSION
5.0000 mg | Freq: Every day | ORAL | Status: DC
  Administered 2015-05-25 – 2015-05-28 (×4): 5 mg
  Filled 2015-05-25 (×6): qty 5

## 2015-05-25 MED ORDER — VANCOMYCIN HCL IN DEXTROSE 750-5 MG/150ML-% IV SOLN
750.0000 mg | Freq: Two times a day (BID) | INTRAVENOUS | Status: DC
  Administered 2015-05-25 – 2015-05-26 (×2): 750 mg via INTRAVENOUS
  Filled 2015-05-25 (×3): qty 150

## 2015-05-25 MED ORDER — POTASSIUM CHLORIDE 20 MEQ/15ML (10%) PO SOLN
40.0000 meq | Freq: Once | ORAL | Status: AC
  Administered 2015-05-25: 40 meq
  Filled 2015-05-25: qty 30

## 2015-05-25 MED ORDER — CIPROFLOXACIN IN D5W 400 MG/200ML IV SOLN: 400.0000 mg | Freq: Two times a day (BID) | INTRAVENOUS | Status: DC

## 2015-05-25 MED ORDER — CIPROFLOXACIN IN D5W 400 MG/200ML IV SOLN
400.0000 mg | Freq: Two times a day (BID) | INTRAVENOUS | Status: DC
  Administered 2015-05-25 – 2015-05-27 (×5): 400 mg via INTRAVENOUS
  Filled 2015-05-25 (×6): qty 200
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
## 2015-05-25 NOTE — Progress Notes (Signed)
ANTIBIOTIC CONSULT NOTE - FOLLOW UP  Pharmacy Consult for Vancomycin and Zosyn Indication: pneumonia  No Known Allergies  Patient Measurements: Height: 6\' 2"  (188 cm) Weight: 154 lb 5.2 oz (70 kg) IBW/kg (Calculated) : 82.2  Vital Signs: Temp: 98.9 F (37.2 C) (10/04 0732) Temp Source: Oral (10/04 0732) BP: 173/70 mmHg (10/04 0700) Pulse Rate: 94 (10/04 0933) Intake/Output from previous day: 10/03 0701 - 10/04 0700 In: 1699 [I.V.:769.6; NG/GT:479.4; IV Piggyback:450] Out: 1130 [Urine:1130] Intake/Output from this shift:    Labs:  Recent Labs  05/23/15 1429 05/24/15 0529 05/24/15 1953 05/25/15 0736  WBC 11.4* 11.9*  --  PENDING  HGB 9.3* 8.5*  --  8.6*  PLT 275 278  --  317  CREATININE 0.84 0.80 0.86 0.85   Estimated Creatinine Clearance: 82.4 mL/min (by C-G formula based on Cr of 0.85).  Recent Labs  05/25/15 0736  VANCOTROUGH 26*     Microbiology: Recent Results (from the past 720 hour(s))  Blood Culture (routine x 2)     Status: None (Preliminary result)   Collection Time: 05/23/15  1:50 PM  Result Value Ref Range Status   Specimen Description BLOOD RIGHT ANTECUBITAL  Final   Special Requests   Final    BOTTLES DRAWN AEROBIC AND ANAEROBIC 10CC BLUE, 5CC RED   Culture NO GROWTH 1 DAY  Final   Report Status PENDING  Incomplete  Blood Culture (routine x 2)     Status: None (Preliminary result)   Collection Time: 05/23/15  1:54 PM  Result Value Ref Range Status   Specimen Description BLOOD RIGHT HAND  Final   Special Requests BOTTLES DRAWN AEROBIC ONLY 10CC  Final   Culture  Setup Time   Final    GRAM POSITIVE COCCI IN CLUSTERS AEROBIC BOTTLE ONLY CRITICAL RESULT CALLED TO, READ BACK BY AND VERIFIED WITH: L SHORT RN 2138 05/24/15 A BROWNING    Culture TOO YOUNG TO READ  Final   Report Status PENDING  Incomplete  Urine culture     Status: None (Preliminary result)   Collection Time: 05/23/15  2:15 PM  Result Value Ref Range Status   Specimen  Description URINE, CATHETERIZED  Final   Special Requests NONE  Final   Culture >=100,000 COLONIES/mL PSEUDOMONAS AERUGINOSA  Final   Report Status PENDING  Incomplete  MRSA PCR Screening     Status: None   Collection Time: 05/23/15  7:19 PM  Result Value Ref Range Status   MRSA by PCR NEGATIVE NEGATIVE Final    Comment:        The GeneXpert MRSA Assay (FDA approved for NASAL specimens only), is one component of a comprehensive MRSA colonization surveillance program. It is not intended to diagnose MRSA infection nor to guide or monitor treatment for MRSA infections.    Assessment:  Day # 3 Vancomycin and Zosyn for HCAP coverage.  Vancomycin trough level is 26 mcg/ml this am, above goal, on Vancomycin 750 mg IV q8hrs.  1 of 2 blood cultures growing gram + cocci in clusters; Pseudomonas growing in urine culture.   Scr 0.86, UOP ~ 0.7 ml/kg/hr.  Quadriplegic, so clearance is expected to be slower than typically predicted with serum creatinine.  Goal of Therapy:  Vancomycin trough level 15-20 mcg/ml appropriate Zosyn dose for renal function and infection  Plan:   Change Vancomycin to 750 mg IV q12hrs. Next dose this afternoon.  Continue Zosyn 3.375 gm IV q8hrs (each over 4 hours).  Follow renal function, UOP, final culture  data and progress.  Will plan to recheck Vanc trough level at steady-state.  Dennie Fetters, RPh Pager: [phone] 05/25/2015,9:43 AM

## 2015-05-25 NOTE — Progress Notes (Signed)
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 white drainage along with blood with foul smelling odor, bone palpable (Active)  Dressing Type ABD;Barrier Film (skin prep);Gauze (Comment);Moist to dry 05/25/2015  8:51 AM  Dressing Changed;Clean;Dry;Intact 05/25/2015  8:51 AM  Dressing Change Frequency Daily 05/25/2015  8:51 AM  State of Healing Early/partial granulation 05/25/2015  8:51 AM  Site / Wound Assessment Yellow;Red;Pink 05/25/2015  8:51 AM  % Wound base Red or Granulating 60% 05/25/2015  8:51 AM  % Wound base Yellow 40% 05/25/2015  8:51 AM  % Wound base Black 0% 05/25/2015  8:51 AM  % Wound base Other (Comment) 0% 05/25/2015  8:51 AM  Peri-wound Assessment Intact 05/25/2015  8:51 AM  Wound Length (cm) 10 cm 05/25/2015  8:51 AM  Wound Width (cm) 8 cm 05/25/2015  8:51 AM  Wound Depth (cm) 3 cm 05/25/2015  8:51 AM  Undermining (cm) 3cm from 10 to 1 o'clock; 1 cm from 1 to 3 o'clock 05/25/2015  8:51 AM  Margins Unattached edges (unapproximated) 05/25/2015  8:51 AM  Drainage Amount Moderate 05/25/2015  8:51 AM  Drainage Description Purulent;Odor 05/25/2015  8:51 AM  Treatment Debridement (Selective);Hydrotherapy (Pulse lavage);Packing (Saline gauze) 05/25/2015  8:51 AM  Santyl applied to wound bed prior to applying dressing.  Hydrotherapy Pulsed lavage therapy - wound location: sacrum Pulsed Lavage with Suction (psi): 8 psi (4-12) Pulsed Lavage with Suction - Normal Saline Used: 1000 mL Pulsed Lavage Tip: Tip with splash shield Selective  Debridement Selective Debridement - Location: sacrum Selective Debridement - Tools Used: Forceps;Scissors Selective Debridement - Tissue Removed: yellow slough   Wound Assessment and Plan  Wound Therapy - Assess/Plan/Recommendations Wound Therapy - Clinical Statement: Pt presents to hydrotherapy with large sacral pressure sore. Can benefit from hydrotherapy to remove necrotic tissue and promote wound healing. Wound Therapy - Functional Problem List: Decreased sitting due to pressure sore Factors Delaying/Impairing Wound Healing: Altered sensation;Incontinence;Immobility;Multiple medical problems;Polypharmacy Hydrotherapy Plan: Debridement;Dressing change;Patient/family education;Pulsatile lavage with suction Wound Therapy - Frequency: 6X / week Wound Therapy - Follow Up Recommendations: Other (comment) (LTACH) Wound Plan: Hydrotherapy and selective debridement 6x/week.  Wound Therapy Goals- Improve the function of patient's integumentary system by progressing the wound(s) through the phases of wound healing (inflammation - proliferation - remodeling) by: Decrease Necrotic Tissue to: 20 Decrease Necrotic Tissue - Progress: Goal set today Increase Granulation Tissue to: 80 Increase Granulation Tissue - Progress: Goal set today  Goals will be updated until maximal potential achieved or discharge criteria met.  Discharge criteria: when goals achieved, discharge from hospital, MD decision/surgical intervention, no progress towards goals, refusal/missing three consecutive treatments without notification or medical reason.  GP     Anthony Avila 05/25/2015, 10:11 AM Suanne Marker PT [phone]

## 2015-05-25 NOTE — Progress Notes (Signed)
ANTIBIOTIC CONSULT NOTE - INITIAL  Pharmacy Consult for cipro Indication: PSA UTI  No Known Allergies  Patient Measurements: Height:  (188 cm) Weight: 154 lb 5.2 oz (70 kg) IBW/kg (Calculated) : 82.2 Adjusted Body Weight:    Vital Signs: Temp: 98.8 F (37.1 C) (10/04 1645) Temp Source: Oral (10/04 1645) BP: 164/77 mmHg (10/04 1527) Pulse Rate: 78 (10/04 1527) Intake/Output from previous day: 10/03 0701 - 10/04 0700 In: 1699 [I.V.:769.6; NG/GT:479.4; IV Piggyback:450] Out: 1130 [Urine:1130] Intake/Output from this shift: Total I/O In: 175 [NG/GT:175] Out: -   Labs:  Recent Labs  05/23/15 1429 05/24/15 0529 05/24/15 1953 05/25/15 0736  WBC 11.4* 11.9*  --  10.6*  HGB 9.3* 8.5*  --  8.6*  PLT 275 278  --  317  CREATININE 0.84 0.80 0.86 0.85   Estimated Creatinine Clearance: 82.4 mL/min (by C-G formula based on Cr of 0.85).  Recent Labs  05/25/15 0736  VANCOTROUGH 26*     Microbiology: Recent Results (from the past 720 hour(s))  Blood Culture (routine x 2)     Status: None (Preliminary result)   Collection Time: 05/23/15  1:50 PM  Result Value Ref Range Status   Specimen Description BLOOD RIGHT ANTECUBITAL  Final   Special Requests   Final    BOTTLES DRAWN AEROBIC AND ANAEROBIC 10CC BLUE, 5CC RED   Culture NO GROWTH 2 DAYS  Final   Report Status PENDING  Incomplete  Blood Culture (routine x 2)     Status: None (Preliminary result)   Collection Time: 05/23/15  1:54 PM  Result Value Ref Range Status   Specimen Description BLOOD RIGHT HAND  Final   Special Requests BOTTLES DRAWN AEROBIC ONLY 10CC  Final   Culture  Setup Time   Final    GRAM POSITIVE COCCI IN CLUSTERS AEROBIC BOTTLE ONLY CRITICAL RESULT CALLED TO, READ BACK BY AND VERIFIED WITH: L SHORT RN [id number] 05/24/15 A BROWNING    Culture TOO YOUNG TO READ  Final   Report Status PENDING  Incomplete  Urine culture     Status: None   Collection Time: 05/23/15  2:15 PM  Result Value Ref Range  Status   Specimen Description URINE, CATHETERIZED  Final   Special Requests NONE  Final   Culture >=100,000 COLONIES/mL PSEUDOMONAS AERUGINOSA  Final   Report Status 05/25/2015 FINAL  Final   Organism ID, Bacteria PSEUDOMONAS AERUGINOSA  Final      Susceptibility   Pseudomonas aeruginosa - MIC*    CEFTAZIDIME >=64 RESISTANT Resistant     CIPROFLOXACIN <=0.25 SENSITIVE Sensitive     GENTAMICIN <=1 SENSITIVE Sensitive     IMIPENEM 2 SENSITIVE Sensitive     CEFEPIME 32 RESISTANT Resistant     * >=100,000 COLONIES/mL PSEUDOMONAS AERUGINOSA  MRSA PCR Screening     Status: None   Collection Time: 05/23/15  7:19 PM  Result Value Ref Range Status   MRSA by PCR NEGATIVE NEGATIVE Final    Comment:        The GeneXpert MRSA Assay (FDA approved for NASAL specimens only), is one component of a comprehensive MRSA colonization surveillance program. It is not intended to diagnose MRSA infection nor to guide or monitor treatment for MRSA infections.     Medical History: Past Medical History  Diagnosis Date  . Diabetes mellitus without complication (HCC)   . HIV disease (HCC)   . Hypertension   . Reflux   . Depressed   . Quadriplegia (HCC)   .  Stage IV pressure ulcer (HCC)     Assessment:  ID: day # 3 Vanc/Zosyn empiric for PNA and now with Pseudomonas UTI (S Cipro/Gent/Primaxin, zosyn not tested?) Also has unstageable pressure ulcer. On daily Santyl  Tmax 100.3, WBC 10.6, PCT 0.17  HIV - [place] and Descovy   Vanc 10/2>>  --10/4: VT 26.0 on 750 mg IV q8hrs, drawn ~2 hrs late, but ~8.5 hrs after last dose;  Zosyn 10/2>>10/4  Cipro 10/4>>   Goal of Therapy:  Eradication of infection  Plan:  D/c Zosyn Cipro  IV q12h   Dartanion Teo S. Merilynn Finland, PharmD, BCPS Clinical Staff Pharmacist Pager [phone]  Misty Stanley Stillinger 05/25/2015,5:49 PM

## 2015-05-25 NOTE — Clinical Documentation Improvement (Signed)
Critical Care  Can the diagnosis of acute encephalopathy be further specified in progress notes and discharge summary   Acute Toxic encephalopathy  Other  Clinically Undetermined  Document any associated diagnoses/conditions.   Supporting Information: Found unresponsive.  H&P : Acute encephalopathy 2nd to narcotic medications He was given narcan and woke up  10/3 progress note Acute encephalopathy 2nd to narcotic medications >>improved  ED provider note Narcan was given, he had immediate improvement  Please exercise your independent, professional judgment when responding. A specific answer is not anticipated or expected.   Thank You,  Harless Litten Health Information Management [place] [phone]

## 2015-05-25 NOTE — Progress Notes (Signed)
eLink Physician-Brief Progress Note Patient Name: Anthony Avila DOB: 09-27-1945 MRN: [id number]   Date of Service  05/25/2015  HPI/Events of Note  Pseudo unrine sens noted  eICU Interventions  Change to cipro     Intervention Category Major Interventions: Infection - evaluation and management  Nelda Bucks. 05/25/2015, 5:45 PM

## 2015-05-26 ENCOUNTER — Encounter (HOSPITAL_COMMUNITY): Payer: Self-pay | Admitting: *Deleted

## 2015-05-26 DIAGNOSIS — N390 Urinary tract infection, site not specified: Secondary | ICD-10-CM

## 2015-05-26 LAB — BASIC METABOLIC PANEL
ANION GAP: 10 (ref 5–15)
BUN: 18 mg/dL (ref 6–20)
CHLORIDE: 105 mmol/L (ref 101–111)
CO2: 25 mmol/L (ref 22–32)
CREATININE: 0.69 mg/dL (ref 0.61–1.24)
Calcium: 8.6 mg/dL — ABNORMAL LOW (ref 8.9–10.3)
GFR calc non Af Amer: >60 mL/min (ref >60)
Glucose, Bld: 182 mg/dL — ABNORMAL HIGH (ref 65–99)
POTASSIUM: 2.9 mmol/L — AB (ref 3.5–5.1)
SODIUM: 140 mmol/L (ref 135–145)

## 2015-05-26 LAB — GLUCOSE, CAPILLARY
GLUCOSE-CAPILLARY: 181 mg/dL — AB (ref 65–99)
GLUCOSE-CAPILLARY: 171 mg/dL — AB (ref 65–99)
GLUCOSE-CAPILLARY: 214 mg/dL — AB (ref 65–99)
GLUCOSE-CAPILLARY: 154 mg/dL — AB (ref 65–99)
Glucose-Capillary: 178 mg/dL — ABNORMAL HIGH (ref 65–99)
Glucose-Capillary: 216 mg/dL — ABNORMAL HIGH (ref 65–99)

## 2015-05-26 LAB — CULTURE, BLOOD (ROUTINE X 2)

## 2015-05-26 LAB — CBC
HEMATOCRIT: 28.3 % — AB (ref 39.0–52.0)
Hemoglobin: 8.8 g/dL — ABNORMAL LOW (ref 13.0–17.0)
MCH: 26.1 pg (ref 26.0–34.0)
MCHC: 31.1 g/dL (ref 30.0–36.0)
MCV: 84 fL (ref 78.0–100.0)
Platelets: 285 10*3/uL (ref 150–400)
RBC: 3.37 MIL/uL — AB (ref 4.22–5.81)
RDW: 15.3 % (ref 11.5–15.5)
WBC: 7.9 10*3/uL (ref 4.0–10.5)

## 2015-05-26 LAB — MAGNESIUM: MAGNESIUM: 1.7 mg/dL (ref 1.7–2.4)

## 2015-05-26 MED ORDER — ZOLPIDEM TARTRATE 5 MG PO TABS
5.0000 mg | ORAL_TABLET | Freq: Every evening | ORAL | Status: DC | PRN
  Administered 2015-05-26 – 2015-05-30 (×2): 5 mg
  Filled 2015-05-26 (×2): qty 1

## 2015-05-26 MED ORDER — HYDRALAZINE HCL 20 MG/ML IJ SOLN
10.0000 mg | INTRAMUSCULAR | Status: DC | PRN
  Administered 2015-05-26 – 2015-06-26 (×6): 10 mg via INTRAVENOUS
  Filled 2015-05-26 (×5): qty 1

## 2015-05-26 MED ORDER — POTASSIUM CHLORIDE 20 MEQ/15ML (10%) PO SOLN
40.0000 meq | ORAL | Status: AC
  Administered 2015-05-26 (×2): 40 meq
  Filled 2015-05-26 (×2): qty 30

## 2015-05-26 MED ORDER — MIDAZOLAM HCL 2 MG/2ML IJ SOLN
2.0000 mg | INTRAMUSCULAR | Status: DC | PRN
  Administered 2015-05-26 – 2015-06-11 (×108): 2 mg via INTRAVENOUS
  Filled 2015-05-26 (×110): qty 2

## 2015-05-26 NOTE — Progress Notes (Signed)
eLink Physician-Brief Progress Note Patient Name: Anthony Avila DOB: [birth year] MRN: [id number]   Date of Service  05/26/2015  HPI/Events of Note  -cannot sleep inspite of seroquel/ clonazepam  eICU Interventions  ambien x low dose prn     Intervention Category Minor Interventions: Routine modifications to care plan (e.g. PRN medications for pain, fever)  Macari Zalesky V. 05/26/2015, 1:12 AM

## 2015-05-26 NOTE — Progress Notes (Addendum)
Checked w [facility] rep Anthony Avila several times on 10-4. He has not heard from [facility] in [place] bout determination of [facility] or not. Will cont to follow. Rep hopes to hear from [facility] today w determination. Anthony Avila rep came by and pt only has 12 reserve medicare days left then will go into medicaid. They ([facility]) cannot take pt. Have spoken w son Anthony Avila and he is aware [facility] not accepted [facility] but [facility]. Have alerted bryant sw that pt ready today. Son wanted to talk w someone he knows and will cal me back to let us know ok to dc to [facility] or not. Await call back from son Anthony.have updated sw and he  has spoken w son and bryant doing addit vent snf bed search but no other bed at present but [facility] sn[facility].

## 2015-05-26 NOTE — Clinical Social Work Note (Signed)
Clinical Social Work Assessment  Patient Details  Name: Anthony Avila MRN: [id number] Date of Birth: [birth year]  Date of referral:  05/26/15               Reason for consult:  Discharge Planning, Facility Placement                Permission sought to share information with:  Facility Medical sales representative, Family Supports Permission granted to share information::  Yes, Verbal Permission Granted  Name::     Personal assistant::  [facility]  Relationship::     Contact Information:     Housing/Transportation Living arrangements for the past 2 months:  Skilled Building surveyor of Information:  Adult Children Patient Interpreter Needed:  None Criminal Activity/Legal Involvement Pertinent to Current Situation/Hospitalization:  No - Comment as needed Significant Relationships:  Adult Children Lives with:  Facility Resident Do you feel safe going back to the place where you live?  Yes Need for family participation in patient care:  Yes (Comment)  Care giving concerns:  Patient and son were hopefull for a discharge [facility].   Social Worker assessment / plan:  CSW received referral from unit [facility] today stating that patient is from [facility] vent [facility]F. CSW complete assessment with Anthony Avila, the patient's son. Anthony Avila states that he was hoping the [facility] could go to [facility] [place]. Anthony Avila requests that he be given time to speak with the patients "HHS CMS, federal case manager" before the patient is discharged to see if the patient can be given more days to make LTACH possible. CSW explained to Anthony Avila that the patient's bed is still available at [facility] if the bed is needed at time of discharge. Per RNCM, the patient will likely be ready to DC today. CSW explained role and process of getting the patient back to [facility] to [place].   Employment status:  Disabled (Comment on whether or not currently receiving Disability) Insurance information:  Medicare, Managed Care PT  Recommendations:  Not assessed at this time Information / Referral to community resources:  Skilled Nursing Facility   Patient/Family's Response to care:  Patient's son appears happy with the care the patient is receiving here, but seems to prefer patient discharge to an LTACH verses the vent SNF.  Patient/Family's Understanding of and Emotional Response to Diagnosis, Current Treatment, and Prognosis:  Patient son has good insight into the patient's diagnosis and reason for admission. Anthony Avila understands what the patient's post DC needs will be.   Emotional Assessment Appearance:  Appears stated age Attitude/Demeanor/Rapport:  Unable to Assess (Patient not completely oriented and on ventilator. ) Affect (typically observed):  Unable to Assess (Patient not completely oriented and on ventilator) Orientation:  Oriented to Self, Oriented to Situation Alcohol / Substance use:    Psych involvement (Current and /or in the community):  No (Comment)  Discharge Needs  Concerns to be addressed:  Discharge Planning Concerns Readmission within the last 30 days:  No Current discharge risk:  Chronically ill, Physical Impairment, Cognitively Impaired Barriers to Discharge:  No Barriers Identified   Roddie Mc MSW, [place], [place], [phone]

## 2015-05-26 NOTE — Clinical Social Work Note (Signed)
CSW spoke with MD regarding FL2 (placed on chart). CSW spoke with patient's son again regarding discharge. Alla German says he will let CSW know his decision regarding [facility] when by 3:00PM, however the patient does not have any other facility options at this point as the patient cannot DC to [facility] (per [facility]) and no other vent SNF bed is currently available.  Roddie Mc MSW, [place], [place], [phone]
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
## 2015-05-26 NOTE — Progress Notes (Signed)
IV teamed called to assese Pt 's right arm IV site. +4 edema noted. IV flushes well but there is no blood return. Will continue to monitor.

## 2015-05-26 NOTE — Clinical Social Work Placement (Signed)
   CLINICAL SOCIAL WORK PLACEMENT  NOTE  Date:  05/26/2015  Patient Details  Name: Anthony Avila MRN: [id number] Date of Birth: 27-Aug-1945  Clinical Social Work is seeking post-discharge placement for this patient at the Skilled  Nursing Facility level of care (*CSW will initial, date and re-position this form in  chart as items are completed):  Yes   Patient/family provided with [place] Clinical Social Work Department's list of facilities offering this level of care within the geographic area requested by the patient (or if unable, by the patient's family).  Yes   Patient/family informed of their freedom to choose among providers that offer the needed level of care, that participate in Medicare, Medicaid or managed care program needed by the patient, have an available bed and are willing to accept the patient.  Yes   Patient/family informed of [place]'s ownership interest in [facility] and [facility], as well as of the fact that they are under no obligation to receive care at these facilities.  PASRR submitted to EDS on       PASRR number received on       Existing PASRR number confirmed on 05/26/15     FL2 transmitted to all facilities in geographic area requested by pt/family on 05/26/15     FL2 transmitted to all facilities within larger geographic area on       Patient informed that his/her managed care company has contracts with or will negotiate with certain facilities, including the following:            Patient/family informed of bed offers received.  Patient chooses bed at       Physician recommends and patient chooses bed at      Patient to be transferred to   on  .  Patient to be transferred to facility by       Patient family notified on   of transfer.  Name of family member notified:        PHYSICIAN       Additional Comment:    _______________________________________________ Roddie Mc MSW, LCSW, [place], [phone]

## 2015-05-26 NOTE — Care Management Important Message (Signed)
Important Message  Patient Details  Name: Anthony Avila MRN: [id number] Date of Birth: April 26, 1946   Medicare Important Message Given:  Yes-second notification given    Orson Aloe 05/26/2015, 12:59 PM

## 2015-05-26 NOTE — Progress Notes (Signed)
Physical Therapy Wound Treatment Patient Details  Name: Anthony Avila MRN: [id number] Date of Birth: August 22, 1945  Today's Date: 05/26/2015 Time: 1005-1044 Time Calculation (min): 39 min  Subjective  Subjective: Pt mouthing appropriately Patient and Family Stated Goals: Not stated due to trach/vent Date of Onset:  (prior to admission) Prior Treatments: Dressing change  Pain Score:   States "some pain" near sacrum, and "It felt better after hydro yesterday" - No numerical value given   Wound Assessment     Pressure Ulcer 05/23/15 Stage IV - Full thickness tissue loss with exposed bone, tendon or muscle. white drainage along with blood with foul smelling odor, bone palpable (Active)  Dressing Type ABD;Barrier Film (skin prep);Gauze (Comment);Moist to dry 05/26/2015 11:47 AM  Dressing Changed;Clean;Dry;Intact 05/26/2015 11:47 AM  Dressing Change Frequency Daily 05/26/2015 11:47 AM  State of Healing Early/partial granulation 05/26/2015 11:47 AM  Site / Wound Assessment Yellow;Red;Pink 05/26/2015 11:47 AM  % Wound base Red or Granulating 60% 05/26/2015 11:47 AM  % Wound base Yellow 40% 05/26/2015 11:47 AM  % Wound base Black 0% 05/26/2015 11:47 AM  % Wound base Other (Comment) 0% 05/26/2015 11:47 AM  Peri-wound Assessment Intact 05/26/2015 11:47 AM  Wound Length (cm) 10 cm 05/25/2015  8:51 AM  Wound Width (cm) 8 cm 05/25/2015  8:51 AM  Wound Depth (cm) 3 cm 05/25/2015  8:51 AM  Undermining (cm) 3cm from 10 to 1 o'clock; 1 cm from 1 to 3 o'clock 05/25/2015  8:51 AM  Margins Unattached edges (unapproximated) 05/26/2015 11:47 AM  Drainage Amount Moderate 05/26/2015 11:47 AM  Drainage Description Purulent;Odor;Green 05/26/2015 11:47 AM  Treatment Cleansed;Debridement (Selective);Hydrotherapy (Pulse lavage);Packing (Saline gauze);Tape changed;Other (Comment) 05/26/2015 11:47 AM  Santyl applied to wound bed prior to applying dressing.  Hydrotherapy Pulsed lavage therapy - wound location: sacrum Pulsed Lavage  with Suction (psi): 8 psi (4-12) Pulsed Lavage with Suction - Normal Saline Used: 1000 mL Pulsed Lavage Tip: Tip with splash shield Selective Debridement Selective Debridement - Location: sacrum Selective Debridement - Tools Used: Forceps;Scissors Selective Debridement - Tissue Removed: yellow slough   Wound Assessment and Plan  Wound Therapy - Assess/Plan/Recommendations Wound Therapy - Clinical Statement: Able to remove loose non-viable tissue and yellow slough. Will continue to benefit from hydrotherapy to promote further wound healing. Wound Therapy - Functional Problem List: Decreased sitting due to pressure sore Factors Delaying/Impairing Wound Healing: Altered sensation;Incontinence;Immobility;Multiple medical problems;Polypharmacy Hydrotherapy Plan: Debridement;Dressing change;Patient/family education;Pulsatile lavage with suction Wound Therapy - Frequency: 6X / week Wound Therapy - Follow Up Recommendations: Other (comment) (LTACH) Wound Plan: Hydrotherapy and selective debridement 6x/week.  Wound Therapy Goals- Improve the function of patient's integumentary system by progressing the wound(s) through the phases of wound healing (inflammation - proliferation - remodeling) by: Decrease Necrotic Tissue to: 20 Decrease Necrotic Tissue - Progress: Progressing toward goal Increase Granulation Tissue to: 80 Increase Granulation Tissue - Progress: Progressing toward goal  Goals will be updated until maximal potential achieved or discharge criteria met.  Discharge criteria: when goals achieved, discharge from hospital, MD decision/surgical intervention, no progress towards goals, refusal/missing three consecutive treatments without notification or medical reason.  GP     [name] 05/26/2015, 11:52 AM  [name], [place]

## 2015-05-26 NOTE — Progress Notes (Signed)
eLink Physician-Brief Progress Note Patient Name: Yanuel Tagg DOB: 30-Jan-1946 MRN: [id number]   Date of Service  05/26/2015  HPI/Events of Note    eICU Interventions  Hypokalemia -repleted      Intervention Category Intermediate Interventions: Electrolyte abnormality - evaluation and management  ALVA,RAKESH V. 05/26/2015, 4:58 AM

## 2015-05-26 NOTE — Clinical Social Work Note (Signed)
CSW received call from patient's son. Anthony Avila states that he would like to pursue other vent SNF options here in [place]. Referrals have been faxed to both [facility] and [facility] per son's request. CSW will followup with bed offers if either facility can accept. Both facilities state they have beds available.   Anthony Avila MSW, [place], [place], [phone]

## 2015-05-27 ENCOUNTER — Inpatient Hospital Stay (HOSPITAL_COMMUNITY): Payer: Medicare Other

## 2015-05-27 DIAGNOSIS — M7989 Other specified soft tissue disorders: Secondary | ICD-10-CM

## 2015-05-27 LAB — BASIC METABOLIC PANEL
ANION GAP: 9 (ref 5–15)
BUN: 15 mg/dL (ref 6–20)
CHLORIDE: 104 mmol/L (ref 101–111)
CO2: 24 mmol/L (ref 22–32)
Calcium: 8.6 mg/dL — ABNORMAL LOW (ref 8.9–10.3)
Creatinine, Ser: 0.67 mg/dL (ref 0.61–1.24)
GFR calc Af Amer: >60 mL/min (ref >60)
GFR calc non Af Amer: >60 mL/min (ref >60)
GLUCOSE: 165 mg/dL — AB (ref 65–99)
POTASSIUM: 3.4 mmol/L — AB (ref 3.5–5.1)
Sodium: 137 mmol/L (ref 135–145)

## 2015-05-27 LAB — GLUCOSE, CAPILLARY
GLUCOSE-CAPILLARY: 170 mg/dL — AB (ref 65–99)
GLUCOSE-CAPILLARY: 221 mg/dL — AB (ref 65–99)
Glucose-Capillary: 168 mg/dL — ABNORMAL HIGH (ref 65–99)
Glucose-Capillary: 172 mg/dL — ABNORMAL HIGH (ref 65–99)

## 2015-05-27 LAB — MAGNESIUM: Magnesium: 1.7 mg/dL (ref 1.7–2.4)

## 2015-05-27 MED ORDER — POTASSIUM CHLORIDE 20 MEQ/15ML (10%) PO SOLN
40.0000 meq | Freq: Once | ORAL | Status: AC
  Administered 2015-05-27: 40 meq
  Filled 2015-05-27: qty 30

## 2015-05-27 MED ORDER — WHITE PETROLATUM GEL
Status: AC
  Administered 2015-05-27: 21:00:00
  Filled 2015-05-27: qty 1

## 2015-05-27 NOTE — Trach Care Team (Addendum)
Trach Care Progression Note   Patient Details Name: Anthony Avila MRN: [id number] DOB: [birth year] Today's Date: 05/27/2015   Tracheostomy Assessment    Tracheostomy Shiley 7 mm Cuffed (Active)  Status Secured 05/27/2015 12:00 PM  Site Assessment Dry;Clean 05/27/2015 12:00 PM  Site Care Other (Comment) 05/26/2015  3:37 PM  Inner Cannula Care Other (Comment) 05/26/2015  3:37 PM  Ties Assessment Clean;Dry;Secure 05/27/2015 12:00 PM  Cuff pressure (cm) 28 cm 05/26/2015  9:04 AM  Emergency Equipment at bedside Yes 05/27/2015 12:00 PM        Respiratory Therapy O2 Device: Ventilator FiO2 (%): 40 % SpO2: 90 % Education: Not applicable Follow up recommendations:  (will follow for progression) Respiratory barriers to progression: Other (comment) (requiring full vent support at this time.)    Speech Language Pathology  SLP chart review complete: Patient does not need SLP services at this time        Nutritional Patient's Current Diet: Tube feeding Tube Feeding: Vital AF 1.2 Cal Tube Feeding Frequency: Continuous Tube Feeding Strength: Full strength    Case Management/Social Work   Possible LTACH in [place]; or vent SNF          [place], [place] CCC-SLP [phone]    Claudine Mouton 05/27/2015, 2:14 PM

## 2015-05-27 NOTE — Discharge Instructions (Signed)
Care of a Feeding Tube People who have trouble swallowing or cannot take food or medicine by mouth are sometimes given feeding tubes. A feeding tube can go into the nose and down to the stomach or through the skin in the abdomen and into the stomach or small bowel. Some of the names of these feeding tubes are gastrostomy tubes, PEG lines, nasogastric tubes, and gastrojejunostomy tubes.  SUPPLIES NEEDED TO CARE FOR THE TUBE SITE  Clean gloves.  Clean wash cloth, gauze pads, or soft paper towel.  Cotton swabs.  Skin barrier ointment or cream.  Soap and water.  Pre-cut foam pads or gauze (that go around the tube).  Tube tape. TUBE SITE CARE 1. Have all supplies ready and available. 2. Wash hands well. 3. Put on clean gloves. 4. Remove the soiled foam pad or gauze, if present, that is found under the tube stabilizer. Change the foam pad or gauze daily or when soiled or moist. 5. Check the skin around the tube site for redness, rash, swelling, drainage, or extra tissue growth. If you notice any of these, call your caregiver. 6. Moisten gauze and cotton swabs with water and soap. 7. Wipe the area closest to the tube (right near the stoma) with cotton swabs. Wipe the surrounding skin with moistened gauze. Rinse with water. 8. Dry the skin and stoma site with a dry gauze pad or soft paper towel. Do not use antibiotic ointments at the tube site. 9. If the skin is red, apply a skin barrier cream or ointment (such as petroleum jelly) in a circular motion, using a cotton swab. The cream or ointment will provide a moisture barrier for the skin and helps with wound healing. 10. Apply a new pre-cut foam pad or gauze around the tube. Secure it with tape around the edges. If no drainage is present, foam pads or gauze may be left off. 11. Use tape or an anchoring device to fasten the feeding tube to the skin for comfort or as directed. Rotate where you tape the tube to avoid skin damage from the  adhesive. 12. Position the person in a semi-upright position (30-45 degree angle). 13. Throw away used supplies. 14. Remove gloves. 15. Wash hands. SUPPLIES NEEDED TO FLUSH A FEEDING TUBE  Clean gloves.  60 mL syringe (that connects to the feeding tube).  Towel.  Water. FLUSHING A FEEDING TUBE  1. Have all supplies ready and available. 2. Wash hands well. 3. Put on clean gloves. 4. Draw up 30 mL of water in the syringe. 5. Kink the feeding tube while disconnecting it from the feeding-bag tubing or while removing the plug at the end of the tube. Kinking closes the tube and prevents secretions in the tube from spilling out. 6. Insert the tip of the syringe into the end of the feeding tube. Release the kink. Slowly inject the water. 7. If unable to inject the water, the person with the feeding tube should lay on his or her left side. The tip of the tube may be against the stomach wall, blocking fluid flow. Changing positions may move the tip away from the stomach wall. After repositioning, try injecting the water again. 8. After injecting the water, remove the syringe. 9. Always flush before giving the first medicine, between medicines, and after the final medicine before starting a feeding. This prevents medicines from clogging the tube. 10. Throw away used supplies. 11. Remove gloves. 12. Wash hands.   This information is not intended to replace   advice given to you by your health care provider. Make sure you discuss any questions you have with your health care provider.   Document Released: 08/07/2005 Document Revised: 07/24/2012 Document Reviewed: 03/21/2012 Elsevier Interactive Patient Education 2016 Elsevier Inc.   

## 2015-05-27 NOTE — Progress Notes (Signed)
Patient seen for trach team follow up.  No education at this time.  All needed equipment at bedside.  Patient on full vent support at this time.  Will continue to follow.

## 2015-05-27 NOTE — Progress Notes (Signed)
Pt has new IV site to LFA via IV team. Good blood return and flushes well. Pt in bed. Eyes closed with no sign of restlessness or distress at this time.Upper extremities and HOB elevated.  Will continue to monitor.

## 2015-05-27 NOTE — Progress Notes (Signed)
*  PRELIMINARY RESULTS* Vascular Ultrasound Right upper extremity venous duplex has been completed.  Preliminary findings: No evidence of DVT or superficial thrombosis.   Farrel Demark, RDMS, RVT  05/27/2015, 11:40 AM

## 2015-05-27 NOTE — Progress Notes (Signed)
Son left vm for sw [facility] to [facility] of [facility] being transferred there. Spoke w Angelica Chessman at [facility] which is [facility]. Angelica Chessman [phone] and she states they will look at pt for ltac faxed inform to [phone]. April luck is the coord at [phone] also. Have faxed inform and they will check medicare and bcbs benefits to see benefits avail and if they can take pt or not. sw also working on vent snf avail. Will cont to follow.

## 2015-05-27 NOTE — Progress Notes (Signed)
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 white drainage along with blood with foul smelling odor, bone palpable (Active)  Dressing Type ABD;Barrier Film (skin prep);Gauze (Comment);Moist to dry;Other (Comment) 05/27/2015 10:18 AM  Dressing Changed;Clean;Dry;Intact 05/27/2015 10:18 AM  Dressing Change Frequency Daily 05/27/2015 10:18 AM  State of Healing Early/partial granulation 05/27/2015 10:18 AM  Site / Wound Assessment Yellow;Red;Pink 05/27/2015 10:18 AM  % Wound base Red or Granulating 65% 05/27/2015 10:18 AM  % Wound base Yellow 35% 05/27/2015 10:18 AM  % Wound base Black 0% 05/27/2015 10:18 AM  % Wound base Other (Comment) 0% 05/27/2015 10:18 AM  Peri-wound Assessment Intact 05/27/2015 10:18 AM  Wound Length (cm) 10 cm 05/25/2015  8:51 AM  Wound Width (cm) 8 cm 05/25/2015  8:51 AM  Wound Depth (cm) 3 cm 05/25/2015  8:51 AM  Undermining (cm) 3cm from 10 to 1 o'clock; 1 cm from 1 to 3 o'clock 05/25/2015  8:51 AM  Margins Unattached edges (unapproximated) 05/27/2015 10:18 AM  Drainage Amount Moderate 05/27/2015 10:18 AM  Drainage Description Odor;Green;Serosanguineous;Purulent 05/27/2015 10:18 AM  Treatment Cleansed;Debridement (Selective);Hydrotherapy (Pulse lavage);Tape changed;Packing (Saline gauze);Other (Comment) 05/27/2015 10:18 AM  Santyl applied to wound bed prior to applying dressing.  Hydrotherapy Pulsed lavage therapy - wound location: sacrum Pulsed Lavage with Suction (psi): 8 psi  (4-12) Pulsed Lavage with Suction - Normal Saline Used: 1000 mL Pulsed Lavage Tip: Tip with splash shield Selective Debridement Selective Debridement - Location: sacrum Selective Debridement - Tools Used: Forceps;Scissors Selective Debridement - Tissue Removed: yellow slough   Wound Assessment and Plan  Wound Therapy - Assess/Plan/Recommendations Wound Therapy - Clinical Statement: More slough removed with selective debridement to promote healthy granulatory growth. Wound Therapy - Functional Problem List: Decreased sitting due to pressure sore Factors Delaying/Impairing Wound Healing: Altered sensation;Incontinence;Immobility;Multiple medical problems;Polypharmacy Hydrotherapy Plan: Debridement;Dressing change;Patient/family education;Pulsatile lavage with suction Wound Therapy - Frequency: 6X / week Wound Therapy - Follow Up Recommendations: Other (comment) (LTACH) Wound Plan: Hydrotherapy and selective debridement 6x/week.  Wound Therapy Goals- Improve the function of patient's integumentary system by progressing the wound(s) through the phases of wound healing (inflammation - proliferation - remodeling) by: Decrease Necrotic Tissue to: 20 Decrease Necrotic Tissue - Progress: Progressing toward goal Increase Granulation Tissue to: 80 Increase Granulation Tissue - Progress: Progressing toward goal  Goals will be updated until maximal potential achieved or discharge criteria met.  Discharge criteria: when goals achieved, discharge from hospital, MD decision/surgical intervention, no progress towards goals, refusal/missing three consecutive treatments without notification or medical reason.  GP     [name] 05/27/2015, 10:27 AM [name], [place]
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
## 2015-05-28 ENCOUNTER — Inpatient Hospital Stay (HOSPITAL_COMMUNITY): Payer: Medicare Other

## 2015-05-28 DIAGNOSIS — J9610 Chronic respiratory failure, unspecified whether with hypoxia or hypercapnia: Secondary | ICD-10-CM

## 2015-05-28 LAB — GLUCOSE, CAPILLARY
GLUCOSE-CAPILLARY: 116 mg/dL — AB (ref 65–99)
GLUCOSE-CAPILLARY: 130 mg/dL — AB (ref 65–99)
GLUCOSE-CAPILLARY: 159 mg/dL — AB (ref 65–99)
GLUCOSE-CAPILLARY: 185 mg/dL — AB (ref 65–99)
Glucose-Capillary: 184 mg/dL — ABNORMAL HIGH (ref 65–99)
Glucose-Capillary: 202 mg/dL — ABNORMAL HIGH (ref 65–99)
Glucose-Capillary: 188 mg/dL — ABNORMAL HIGH (ref 65–99)
Glucose-Capillary: 140 mg/dL — ABNORMAL HIGH (ref 65–99)

## 2015-05-28 LAB — BASIC METABOLIC PANEL WITH GFR
Anion gap: 8 (ref 5–15)
BUN: 14 mg/dL (ref 6–20)
CO2: 26 mmol/L (ref 22–32)
Calcium: 8.8 mg/dL — ABNORMAL LOW (ref 8.9–10.3)
Chloride: 105 mmol/L (ref 101–111)
Creatinine, Ser: 0.56 mg/dL — ABNORMAL LOW (ref 0.61–1.24)
GFR calc Af Amer: >60 mL/min
GFR calc non Af Amer: >60 mL/min
Glucose, Bld: 174 mg/dL — ABNORMAL HIGH (ref 65–99)
Potassium: 3.5 mmol/L (ref 3.5–5.1)
Sodium: 139 mmol/L (ref 135–145)

## 2015-05-28 LAB — MAGNESIUM: Magnesium: 1.7 mg/dL (ref 1.7–2.4)

## 2015-05-28 LAB — PROCALCITONIN: Procalcitonin: <0.1 ng/mL

## 2015-05-28 LAB — POCT I-STAT 3, ART BLOOD GAS (G3+)
ACID-BASE EXCESS: 4 mmol/L — AB (ref 0.0–2.0)
Bicarbonate: 26.8 mEq/L — ABNORMAL HIGH (ref 20.0–24.0)
O2 Saturation: 100 %
PH ART: 7.496 — AB (ref 7.350–7.450)
PO2 ART: 163 mmHg — AB (ref 80.0–100.0)
Patient temperature: 98.7
TCO2: 28 mmol/L (ref 0–100)
pCO2 arterial: 34.7 mmHg — ABNORMAL LOW (ref 35.0–45.0)

## 2015-05-28 LAB — CULTURE, BLOOD (ROUTINE X 2): CULTURE: NO GROWTH

## 2015-05-28 LAB — PHOSPHORUS: Phosphorus: 2.5 mg/dL (ref 2.5–4.6)

## 2015-05-28 LAB — TROPONIN I: Troponin I: <0.03 ng/mL

## 2015-05-28 MED ORDER — ACETAMINOPHEN-CODEINE #4 300-60 MG PO TABS: 1.0000 | ORAL_TABLET | Freq: Four times a day (QID) | ORAL | Status: DC | PRN

## 2015-05-28 MED ORDER — ACETAMINOPHEN-CODEINE #3 300-30 MG PO TABS
1.0000 | ORAL_TABLET | Freq: Four times a day (QID) | ORAL | Status: DC | PRN
  Administered 2015-05-28 – 2015-05-30 (×7): 1 via ORAL
  Filled 2015-05-28 (×7): qty 1

## 2015-05-28 MED ORDER — AMLODIPINE 1 MG/ML ORAL SUSPENSION
10.0000 mg | Freq: Every day | ORAL | Status: DC
  Administered 2015-05-29 – 2015-06-19 (×22): 10 mg
  Filled 2015-05-28 (×23): qty 10

## 2015-05-28 MED ORDER — PHENOL 1.4 % MT LIQD
1.0000 | OROMUCOSAL | Status: DC | PRN
  Administered 2015-06-04: 1 via OROMUCOSAL
  Filled 2015-05-28: qty 177

## 2015-05-28 MED ORDER — CODEINE SULFATE 15 MG PO TABS
30.0000 mg | ORAL_TABLET | Freq: Four times a day (QID) | ORAL | Status: DC | PRN
  Administered 2015-05-28 – 2015-05-30 (×7): 30 mg via ORAL
  Filled 2015-05-28 (×7): qty 2

## 2015-05-28 MED ORDER — LISINOPRIL 20 MG PO TABS
20.0000 mg | ORAL_TABLET | Freq: Every day | ORAL | Status: DC
  Administered 2015-05-29 – 2015-06-06 (×9): 20 mg
  Filled 2015-05-28 (×9): qty 1

## 2015-05-28 MED ORDER — POTASSIUM CHLORIDE 20 MEQ/15ML (10%) PO SOLN
20.0000 meq | Freq: Once | ORAL | Status: AC
  Administered 2015-05-28: 20 meq
  Filled 2015-05-28: qty 15

## 2015-05-28 MED ORDER — BACLOFEN 1 MG/ML ORAL SUSPENSION
10.0000 mg | Freq: Four times a day (QID) | ORAL | Status: DC
  Administered 2015-05-28 – 2015-06-28 (×123): 10 mg
  Filled 2015-05-28 (×131): qty 1

## 2015-05-28 NOTE — Clinical Social Work Note (Signed)
CSW and RNCM attempted to call the patient's son Alla German to inform him that we do not have any other vent SNF options for the patient (Both [facility] and [facility] are unable to offer a bed at this time). RNCM has heard back from [facility] which is able to offer the patient a bed. Unfortunately CSW and RNCM had to leave a message for Alla German. CSW has also left message with Kennyth Arnold at [facility] to see if the patient's vent SNF bed is still available.  Roddie Mc MSW, [place], [place], [phone]

## 2015-05-28 NOTE — Clinical Social Work Note (Signed)
CSW still did not hear back from patient's son regarding disposition. CSW has not heard from [place] at [facility] regarding whether the patient's vent SNF bed is still available. [facility] has denied the patient for admission. [facility] in [place] is still reviewing the patient's information and will get back to CSW. CSW will send information to other vent SNFs once son gives permission for CSW to do so. Report left for weekend CSW.   Roddie Mc MSW, [place], [place], [phone]

## 2015-05-28 NOTE — Progress Notes (Signed)
[facility] called and they have denied adm for pt. They will call son pierre to let him know and answer questions since son had first contacted them and req we send inform to them. Alerted sw and he will ck on any vent snf bed offers and see if [facility] snf  bed still available.

## 2015-05-28 NOTE — Progress Notes (Signed)
Pt requires 60-70 cmH2O trach cuff pressure to maintain seal at MLT, Dr Vassie Loll aware.

## 2015-05-28 NOTE — Progress Notes (Signed)
Brother kelli Howell came by. He states pierre son wuld like me to call him. sw and i had called earlier but got voice mail. Attempt tocall son again but got voice mail. Have left vm w phone number for son to call me back. Explained that 3ltacs have turned down but [facility] willing to take in their ltac but limited on beds. Await call back.  sw still looking for vent snf but no other offers yet. Cont to follow.

## 2015-05-28 NOTE — Progress Notes (Addendum)
Still have not heard back from son. He had told me he travels all over [place]. i have text him also. [facility] only had one ltac bed so will need to ck w them when son calls. Had verbal denial from [facility] in [place]  but have faxed inform for them to ck again for elid[facility] there. sw still working on vent snfs.
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
## 2015-05-28 NOTE — Progress Notes (Addendum)
eLink Physician-Brief Progress Note Patient Name: Anthony Avila DOB: 06-03-1946 MRN: [id number]   Date of Service  05/28/2015  HPI/Events of Note  Patient c/o SOB.  eICU Interventions  Will order: 1. Portable CXR now. 2. ABG now.  3. 12 Lead EKG now. 4. Troponin now and Q 6 hours X 2 (total 3 sets).     Intervention Category Intermediate Interventions: Respiratory distress - evaluation and management  Rolly Magri [name] 05/28/2015, 8:01 PM

## 2015-05-28 NOTE — Progress Notes (Signed)
Pt switcheed to CPAP/PS per MD, tolerated well.  Pain med given, pt switched back to [facility].

## 2015-05-28 NOTE — Progress Notes (Signed)
eLink Physician-Brief Progress Note Patient Name: Anthony Avila DOB: December 13, 1945 MRN: [id number]   Date of Service  05/28/2015  HPI/Events of Note  Patient c/o muscle spasm. Currently on Baclofen 10 mg Q 8 hours.   eICU Interventions  Will increase Baclofen to 10 mg PO Q 6 hours.      Intervention Category Intermediate Interventions: Pain - evaluation and management  Anthony Avila [name] 05/28/2015, 5:33 PM

## 2015-05-28 NOTE — Progress Notes (Signed)
Physical Therapy Wound Treatment Patient Details  Name: Yisrael Obryan MRN: [id number] Date of Birth: [birth year]  Today's Date: 05/28/2015 Time: 0825-0907 Time Calculation (min): 42 min  Subjective  Subjective: Mouthing words appropriately Patient and Family Stated Goals: Not stated due to trach/vent Date of Onset:  (prior to admission) Prior Treatments: Dressing change  Pain Score:   No numerical value given but requests pain medication. 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 white drainage along with blood with foul smelling odor, bone palpable (Active)  Dressing Type ABD;Barrier Film (skin prep);Gauze (Comment);Moist to dry;Other (Comment) 05/28/2015 10:08 AM  Dressing Changed;Clean;Dry;Intact 05/28/2015 10:08 AM  Dressing Change Frequency Daily 05/28/2015 10:08 AM  State of Healing Early/partial granulation 05/28/2015 10:08 AM  Site / Wound Assessment Yellow;Red;Pink 05/28/2015 10:08 AM  % Wound base Red or Granulating 60% 05/28/2015 10:08 AM  % Wound base Yellow 40% 05/28/2015 10:08 AM  % Wound base Black 0% 05/28/2015 10:08 AM  % Wound base Other (Comment) 0% 05/28/2015 10:08 AM  Peri-wound Assessment Intact 05/28/2015 10:08 AM  Wound Length (cm) 10 cm 05/25/2015  8:51 AM  Wound Width (cm) 8 cm 05/25/2015  8:51 AM  Wound Depth (cm) 3 cm 05/25/2015  8:51 AM  Undermining (cm) 3cm from 10 to 1 o'clock; 1 cm from 1 to 3 o'clock 05/25/2015  8:51 AM  Margins Unattached edges (unapproximated) 05/28/2015 10:08 AM  Drainage Amount Moderate 05/28/2015 10:08 AM  Drainage Description Odor;Green;Serosanguineous;Purulent 05/28/2015 10:08 AM  Treatment Cleansed;Debridement (Selective);Hydrotherapy (Pulse lavage);Packing (Saline gauze);Tape changed;Other (Comment) 05/28/2015 10:08 AM  Santyl applied to wound bed prior to applying dressing.  Hydrotherapy Pulsed lavage therapy -  wound location: sacrum Pulsed Lavage with Suction (psi): 8 psi (4-12) Pulsed Lavage with Suction - Normal Saline Used: 1000 mL Pulsed Lavage Tip: Tip with splash shield Selective Debridement Selective Debridement - Location: sacrum Selective Debridement - Tools Used: Forceps;Scissors Selective Debridement - Tissue Removed: yellow slough   Wound Assessment and Plan  Wound Therapy - Assess/Plan/Recommendations Wound Therapy - Clinical Statement: Perimeter granulating well. Focused on removal of more non-viable tissue within wound bed. Wound Therapy - Functional Problem List: Decreased sitting due to pressure sore Factors Delaying/Impairing Wound Healing: Altered sensation;Incontinence;Immobility;Multiple medical problems;Polypharmacy Hydrotherapy Plan: Debridement;Dressing change;Patient/family education;Pulsatile lavage with suction Wound Therapy - Frequency: 6X / week Wound Therapy - Follow Up Recommendations: Other (comment) (LTACH) Wound Plan: Hydrotherapy and selective debridement 6x/week.  Wound Therapy Goals- Improve the function of patient's integumentary system by progressing the wound(s) through the phases of wound healing (inflammation - proliferation - remodeling) by: Decrease Necrotic Tissue to: 20 Decrease Necrotic Tissue - Progress: Progressing toward goal Increase Granulation Tissue to: 80 Increase Granulation Tissue - Progress: Progressing toward goal  Goals will be updated until maximal potential achieved or discharge criteria met.  Discharge criteria: when goals achieved, discharge from hospital, MD decision/surgical intervention, no progress towards goals, refusal/missing three consecutive treatments without notification or medical reason.  GP     [name] 05/28/2015, 10:11 AM  [name], [place]

## 2015-05-29 DIAGNOSIS — M7989 Other specified soft tissue disorders: Secondary | ICD-10-CM | POA: Insufficient documentation

## 2015-05-29 DIAGNOSIS — J810 Acute pulmonary edema: Secondary | ICD-10-CM

## 2015-05-29 DIAGNOSIS — J811 Chronic pulmonary edema: Secondary | ICD-10-CM | POA: Insufficient documentation

## 2015-05-29 DIAGNOSIS — R0902 Hypoxemia: Secondary | ICD-10-CM

## 2015-05-29 DIAGNOSIS — K9423 Gastrostomy malfunction: Secondary | ICD-10-CM | POA: Insufficient documentation

## 2015-05-29 LAB — GLUCOSE, CAPILLARY
GLUCOSE-CAPILLARY: 153 mg/dL — AB (ref 65–99)
GLUCOSE-CAPILLARY: 191 mg/dL — AB (ref 65–99)
GLUCOSE-CAPILLARY: 175 mg/dL — AB (ref 65–99)
Glucose-Capillary: 139 mg/dL — ABNORMAL HIGH (ref 65–99)
Glucose-Capillary: 201 mg/dL — ABNORMAL HIGH (ref 65–99)

## 2015-05-29 LAB — BASIC METABOLIC PANEL
ANION GAP: 7 (ref 5–15)
BUN: 16 mg/dL (ref 6–20)
CALCIUM: 8.7 mg/dL — AB (ref 8.9–10.3)
CHLORIDE: 102 mmol/L (ref 101–111)
CO2: 26 mmol/L (ref 22–32)
Creatinine, Ser: 0.55 mg/dL — ABNORMAL LOW (ref 0.61–1.24)
GFR calc non Af Amer: >60 mL/min (ref >60)
Glucose, Bld: 210 mg/dL — ABNORMAL HIGH (ref 65–99)
Potassium: 3.8 mmol/L (ref 3.5–5.1)
SODIUM: 135 mmol/L (ref 135–145)

## 2015-05-29 LAB — PROCALCITONIN: Procalcitonin: <0.1 ng/mL

## 2015-05-29 LAB — TROPONIN I: Troponin I: <0.03 ng/mL (ref <0.031)

## 2015-05-29 MED ORDER — ZOLPIDEM TARTRATE 5 MG PO TABS: 5.0000 mg | ORAL_TABLET | Freq: Every evening | ORAL | Status: DC | PRN

## 2015-05-29 MED ORDER — FUROSEMIDE 10 MG/ML PO SOLN
20.0000 mg | Freq: Two times a day (BID) | ORAL | Status: DC
  Administered 2015-05-29 – 2015-06-02 (×9): 20 mg
  Filled 2015-05-29 (×12): qty 2

## 2015-05-29 MED ORDER — LORAZEPAM 1 MG PO TABS
1.0000 mg | ORAL_TABLET | Freq: Two times a day (BID) | ORAL | Status: DC
  Administered 2015-05-29 – 2015-06-11 (×27): 1 mg
  Filled 2015-05-29 (×27): qty 1
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
## 2015-05-29 NOTE — Progress Notes (Signed)
Physical Therapy Wound Treatment Patient Details  Name: Anthony Avila MRN: [id number] Date of Birth: 1946/06/24  Today's Date: 05/29/2015 Time: 7793-9030 Time Calculation (min): 43 min  Subjective  Subjective: "Feels like there is a screwdriver in the top of my head" Patient and Family Stated Goals: Not stated due to trach/vent Date of Onset:  (prior to admission) Prior Treatments: Dressing change  Pain Score: "Worse than a headache" RN notified.  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 white drainage along with blood with foul smelling odor, bone palpable (Active)  Dressing Type ABD;Barrier Film (skin prep);Gauze (Comment);Moist to dry;Other (Comment) 05/29/2015 11:09 AM  Dressing Changed;Clean;Dry;Intact 05/29/2015 11:09 AM  Dressing Change Frequency Daily 05/29/2015 11:09 AM  State of Healing Early/partial granulation 05/29/2015 11:09 AM  Site / Wound Assessment Yellow;Red;Pink 05/29/2015 11:09 AM  % Wound base Red or Granulating 65% 05/29/2015 11:09 AM  % Wound base Yellow 35% 05/29/2015 11:09 AM  % Wound base Black 0% 05/29/2015 11:09 AM  % Wound base Other (Comment) 0% 05/29/2015 11:09 AM  Peri-wound Assessment Intact 05/29/2015 11:09 AM  Wound Length (cm) 10 cm 05/25/2015  8:51 AM  Wound Width (cm) 8 cm 05/25/2015  8:51 AM  Wound Depth (cm) 3 cm 05/25/2015  8:51 AM  Undermining (cm) 3cm from 10 to 1 o'clock; 1 cm from 1 to 3 o'clock 05/25/2015  8:51 AM  Margins Unattached edges (unapproximated) 05/29/2015 11:09 AM  Drainage Amount Moderate 05/29/2015 11:09 AM  Drainage Description Odor;Green;Serosanguineous;Purulent 05/29/2015 11:09 AM  Treatment Cleansed;Debridement (Selective);Hydrotherapy (Pulse lavage);Packing (Saline gauze);Tape changed;Other (Comment) 05/29/2015 11:09 AM  Santyl applied to wound bed prior to applying dressing.  Hydrotherapy Pulsed lavage therapy - wound location: sacrum Pulsed Lavage with  Suction (psi): 8 psi (4-12) Pulsed Lavage with Suction - Normal Saline Used: 1000 mL Pulsed Lavage Tip: Tip with splash shield Selective Debridement Selective Debridement - Location: sacrum Selective Debridement - Tools Used: Forceps;Scissors Selective Debridement - Tissue Removed: yellow slough   Wound Assessment and Plan  Wound Therapy - Assess/Plan/Recommendations Wound Therapy - Clinical Statement: More purulence noted. Continue with current plan of care to remove non-viable tissue. Wound Therapy - Functional Problem List: Decreased sitting due to pressure sore Factors Delaying/Impairing Wound Healing: Altered sensation;Incontinence;Immobility;Multiple medical problems;Polypharmacy Hydrotherapy Plan: Debridement;Dressing change;Patient/family education;Pulsatile lavage with suction Wound Therapy - Frequency: 6X / week Wound Therapy - Follow Up Recommendations: Other (comment) (LTACH) Wound Plan: Hydrotherapy and selective debridement 6x/week.  Wound Therapy Goals- Improve the function of patient's integumentary system by progressing the wound(s) through the phases of wound healing (inflammation - proliferation - remodeling) by: Decrease Necrotic Tissue to: 20 Decrease Necrotic Tissue - Progress: Progressing toward goal Increase Granulation Tissue to: 80 Increase Granulation Tissue - Progress: Progressing toward goal  Goals will be updated until maximal potential achieved or discharge criteria met.  Discharge criteria: when goals achieved, discharge from hospital, MD decision/surgical intervention, no progress towards goals, refusal/missing three consecutive treatments without notification or medical reason.  GP     [name] 05/29/2015, 11:14 AM [name], [place]

## 2015-05-29 NOTE — Progress Notes (Signed)
During bedside rounding with previous day shift nurse, pt. C/o of SOB. His o2 sat on the monitor was 100% with the ventilator at .40 Fio2. He stated that he felt something was wrong. He did not appear to be in respiratory distress but did seem very anxious. Dr. Dellie Catholic was notified by day shift nurse and new orders received. Patient was given something for anxiety as well. This seem to relax him. Family is at the bedside.

## 2015-05-30 ENCOUNTER — Inpatient Hospital Stay (HOSPITAL_COMMUNITY): Payer: Medicare Other

## 2015-05-30 DIAGNOSIS — R0681 Apnea, not elsewhere classified: Secondary | ICD-10-CM

## 2015-05-30 LAB — GLUCOSE, CAPILLARY
GLUCOSE-CAPILLARY: 157 mg/dL — AB (ref 65–99)
GLUCOSE-CAPILLARY: 158 mg/dL — AB (ref 65–99)
GLUCOSE-CAPILLARY: 180 mg/dL — AB (ref 65–99)
GLUCOSE-CAPILLARY: 184 mg/dL — AB (ref 65–99)
Glucose-Capillary: 121 mg/dL — ABNORMAL HIGH (ref 65–99)
Glucose-Capillary: 226 mg/dL — ABNORMAL HIGH (ref 65–99)

## 2015-05-30 LAB — BASIC METABOLIC PANEL
ANION GAP: 11 (ref 5–15)
BUN: 15 mg/dL (ref 6–20)
CHLORIDE: 98 mmol/L — AB (ref 101–111)
CO2: 27 mmol/L (ref 22–32)
CREATININE: 0.64 mg/dL (ref 0.61–1.24)
Calcium: 9 mg/dL (ref 8.9–10.3)
GFR calc non Af Amer: >60 mL/min (ref >60)
Glucose, Bld: 155 mg/dL — ABNORMAL HIGH (ref 65–99)
POTASSIUM: 3.7 mmol/L (ref 3.5–5.1)
SODIUM: 136 mmol/L (ref 135–145)

## 2015-05-30 LAB — PHOSPHORUS: PHOSPHORUS: 3.3 mg/dL (ref 2.5–4.6)

## 2015-05-30 LAB — MAGNESIUM: MAGNESIUM: 1.7 mg/dL (ref 1.7–2.4)

## 2015-05-30 MED ORDER — FENTANYL CITRATE (PF) 100 MCG/2ML IJ SOLN
100.0000 ug | INTRAMUSCULAR | Status: DC | PRN
  Administered 2015-05-30 – 2015-06-11 (×91): 100 ug via INTRAVENOUS
  Filled 2015-05-30 (×97): qty 2

## 2015-05-30 MED ORDER — FENTANYL 25 MCG/HR TD PT72
25.0000 ug | MEDICATED_PATCH | TRANSDERMAL | Status: DC
  Administered 2015-05-30 – 2015-06-05 (×3): 25 ug via TRANSDERMAL
  Filled 2015-05-30: qty 1
  Filled 2015-05-30 (×2): qty 2

## 2015-05-30 MED ORDER — ZOLPIDEM TARTRATE 5 MG PO TABS
10.0000 mg | ORAL_TABLET | Freq: Every evening | ORAL | Status: DC | PRN
  Administered 2015-05-30 – 2015-06-21 (×11): 10 mg
  Filled 2015-05-30 (×12): qty 2

## 2015-05-30 MED ORDER — MAGNESIUM SULFATE 2 GM/50ML IV SOLN
2.0000 g | Freq: Once | INTRAVENOUS | Status: AC
  Administered 2015-05-30: 2 g via INTRAVENOUS
  Filled 2015-05-30: qty 50

## 2015-05-30 MED ORDER — POTASSIUM CHLORIDE 20 MEQ/15ML (10%) PO SOLN
40.0000 meq | Freq: Once | ORAL | Status: AC
  Administered 2015-05-30: 40 meq
  Filled 2015-05-30: qty 30

## 2015-05-30 MED ORDER — POLYETHYLENE GLYCOL 3350 17 G PO PACK
17.0000 g | PACK | Freq: Every day | ORAL | Status: DC
  Administered 2015-05-30 – 2015-06-28 (×20): 17 g via ORAL
  Filled 2015-05-30 (×21): qty 1

## 2015-05-30 MED ORDER — MAGNESIUM SULFATE 50 % IJ SOLN
2.0000 g | Freq: Once | INTRAVENOUS | Status: DC
  Filled 2015-05-30: qty 4

## 2015-05-30 NOTE — Progress Notes (Addendum)
Pt anxious and requesting pain med.  Will continue to monitor Karena Addison T
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
## 2015-05-31 LAB — GLUCOSE, CAPILLARY
GLUCOSE-CAPILLARY: 177 mg/dL — AB (ref 65–99)
GLUCOSE-CAPILLARY: 193 mg/dL — AB (ref 65–99)
GLUCOSE-CAPILLARY: 212 mg/dL — AB (ref 65–99)
GLUCOSE-CAPILLARY: 128 mg/dL — AB (ref 65–99)
GLUCOSE-CAPILLARY: 141 mg/dL — AB (ref 65–99)
Glucose-Capillary: 163 mg/dL — ABNORMAL HIGH (ref 65–99)

## 2015-05-31 LAB — BASIC METABOLIC PANEL
ANION GAP: 9 (ref 5–15)
BUN: 12 mg/dL (ref 6–20)
CO2: 26 mmol/L (ref 22–32)
Calcium: 8.8 mg/dL — ABNORMAL LOW (ref 8.9–10.3)
Chloride: 101 mmol/L (ref 101–111)
Creatinine, Ser: 0.6 mg/dL — ABNORMAL LOW (ref 0.61–1.24)
GFR calc Af Amer: >60 mL/min (ref >60)
GLUCOSE: 182 mg/dL — AB (ref 65–99)
POTASSIUM: 4 mmol/L (ref 3.5–5.1)
Sodium: 136 mmol/L (ref 135–145)

## 2015-05-31 LAB — CBC
HCT: 25.7 % — ABNORMAL LOW (ref 39.0–52.0)
HEMOGLOBIN: 7.9 g/dL — AB (ref 13.0–17.0)
MCH: 25.3 pg — AB (ref 26.0–34.0)
MCHC: 30.7 g/dL (ref 30.0–36.0)
MCV: 82.4 fL (ref 78.0–100.0)
Platelets: 302 10*3/uL (ref 150–400)
RBC: 3.12 MIL/uL — ABNORMAL LOW (ref 4.22–5.81)
RDW: 15.7 % — AB (ref 11.5–15.5)
WBC: 8.6 10*3/uL (ref 4.0–10.5)

## 2015-05-31 LAB — PHOSPHORUS: Phosphorus: 3.9 mg/dL (ref 2.5–4.6)

## 2015-05-31 LAB — MAGNESIUM: MAGNESIUM: 2.2 mg/dL (ref 1.7–2.4)

## 2015-05-31 MED ORDER — AMLODIPINE 1 MG/ML ORAL SUSPENSION: 10.0000 mg | Freq: Every day | ORAL | Status: DC

## 2015-05-31 MED ORDER — COLLAGENASE 250 UNIT/GM EX OINT: TOPICAL_OINTMENT | Freq: Every day | CUTANEOUS | Status: DC

## 2015-05-31 MED ORDER — VITAL AF 1.2 CAL PO LIQD: ORAL | Status: AC

## 2015-05-31 MED ORDER — FENTANYL 25 MCG/HR TD PT72: 25.0000 ug | MEDICATED_PATCH | TRANSDERMAL | Status: DC

## 2015-05-31 MED ORDER — BISACODYL 10 MG RE SUPP: 10.0000 mg | Freq: Every day | RECTAL | Status: DC

## 2015-05-31 MED ORDER — BACLOFEN 10 MG PO TABS: 10.0000 mg | ORAL_TABLET | Freq: Four times a day (QID) | ORAL | Status: DC

## 2015-05-31 NOTE — Progress Notes (Addendum)
Pt wants to discuss situation with his brother kelly before making decision. Tresa Endo brother states he will be over this afternoon to speak w pt. Will cont to follow.pt has been told that he was denied adm at 3other [facility] and only [facility] that made offer was [facility]. If he does not want [facility] then vent snf search has been in progress. Per md pt is vent snf approp.

## 2015-05-31 NOTE — Progress Notes (Addendum)
Brother kelly spoke w pt and son pierre. Son plans to appeal disch. Will cont to follow.pt refuses [facility] bed and pt had offers has ltac and snf level and refused both.

## 2015-05-31 NOTE — Care Management Important Message (Signed)
Important Message  Patient Details  Name: Anthony Avila MRN: [id number] Date of Birth: 02-11-1946   Medicare Important Message Given:  Yes-third notification given    Orson Aloe 05/31/2015, 1:38 PM

## 2015-05-31 NOTE — Clinical Social Work Note (Signed)
CSW received call from [place] at [facility] stating that the facility is able to accept the patient back at discharge. At this time CSW has not received an offer from any other vent facilities. RNCM and CSW met with patient at bedside to discuss the patient's options ([facility] vs [facility] vent SNF). The patient states he needs to think about this before making a decision. CSW was contacted by the patient's son Anthony Avila today, Anthony Avila has asked CSW to send a vent SNF referral to [place] and facilities in the [place], [place] area.   Liz Beach MSW, Rena Lara, [place], [phone]

## 2015-05-31 NOTE — Progress Notes (Signed)
Dc order written. Have spoken w son pierre,brother kelly and pt. Pt and brother kelly discussing dc options. Pt's brother to call son pierre after they discuss dc options and let us know determination. Cont to follow.

## 2015-05-31 NOTE — Progress Notes (Signed)
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 white drainage along with blood with foul smelling odor, bone palpable (Active)  Dressing Type ABD;Barrier Film (skin prep);Gauze (Comment);Moist to dry;Other (Comment) 05/31/2015  2:40 PM  Dressing Changed;Clean;Dry;Intact 05/31/2015  2:40 PM  Dressing Change Frequency Daily 05/31/2015  2:40 PM  State of Healing Early/partial granulation 05/31/2015  2:40 PM  Site / Wound Assessment Yellow;Red;Pink 05/31/2015  2:40 PM  % Wound base Red or Granulating 65% 05/31/2015  2:40 PM  % Wound base Yellow 35% 05/31/2015  2:40 PM  % Wound base Black 0% 05/31/2015  2:40 PM  % Wound base Other (Comment) 0% 05/31/2015  2:40 PM  Peri-wound Assessment Intact 05/31/2015  2:40 PM  Wound Length (cm) 10 cm 05/25/2015  8:51 AM  Wound Width (cm) 8 cm 05/25/2015  8:51 AM  Wound Depth (cm) 3 cm 05/25/2015  8:51 AM  Undermining (cm) 3cm from 10 to 1 o'clock; 1 cm from 1 to 3 o'clock 05/25/2015  8:51 AM  Margins Unattached edges (unapproximated) 05/31/2015  2:40 PM  Drainage Amount Moderate 05/31/2015  2:40 PM  Drainage Description Odor;Green;Serosanguineous;Purulent 05/31/2015  2:40 PM  Treatment Hydrotherapy (Pulse lavage);Packing (Saline gauze) 05/31/2015  2:40 PM  Santyl applied to wound bed prior to applying dressing.  Hydrotherapy Pulsed lavage therapy - wound location: sacrum Pulsed Lavage with Suction (psi): 8 psi (4-8) Pulsed Lavage with Suction - Normal Saline Used: 1000  mL Pulsed Lavage Tip: Tip with splash shield   Wound Assessment and Plan  Wound Therapy - Assess/Plan/Recommendations Wound Therapy - Clinical Statement: No significant change. Wound Therapy - Functional Problem List: Decreased sitting due to pressure sore Factors Delaying/Impairing Wound Healing: Altered sensation;Incontinence;Immobility;Multiple medical problems;Polypharmacy Hydrotherapy Plan: Debridement;Dressing change;Patient/family education;Pulsatile lavage with suction Wound Therapy - Frequency: 6X / week Wound Therapy - Follow Up Recommendations: Other (comment) (LTACH) Wound Plan: Hydrotherapy and selective debridement 6x/week.  Wound Therapy Goals- Improve the function of patient's integumentary system by progressing the wound(s) through the phases of wound healing (inflammation - proliferation - remodeling) by: Decrease Necrotic Tissue to: 20 Decrease Necrotic Tissue - Progress: Progressing toward goal Increase Granulation Tissue to: 80 Increase Granulation Tissue - Progress: Progressing toward goal  Goals will be updated until maximal potential achieved or discharge criteria met.  Discharge criteria: when goals achieved, discharge from hospital, MD decision/surgical intervention, no progress towards goals, refusal/missing three consecutive treatments without notification or medical reason.  GP     [name] 05/31/2015, 6:34 PM [place]
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
## 2015-05-31 NOTE — Clinical Social Work Note (Signed)
Per RNCM, patient and patient's brother have both refused the SNF and LTACH beds at [facility]. CSW spoke with Pierre (patient's son) late this afternoon. CSW reiterated that only vent SNF bed available at this time is with [facility]. Below are the facilities that hAlla German been contacted:  [facility] - Per Pierre's request referral has been sent, no response yet. [facility] - Facility can accept patient back at this time, but bed will not be held for the patient. [facility] - Facility states they will not be offering the patient a bed. [facility] - Facility has been "reviewing" patient's information since 10/7, but have not given a decision. No beds on 10/7.  Alla German has requested that CSW make referrals to [facility]) skilled nursing facility and the [facility] in [place], both of which do not accept patients requiring ventilator care. CSW was informed by [facility] that the following three facilities in the [place] area are able to provide ventilator support:  [facility] - [phone] [facility] - [phone] [facility] - [phone]  Alla German states that he wants to review these facilities before referrals are made. Alla German requests that this facility information be emailed to him at [email or web address]. CSW has sent facility information to Pierre's email. CSW will wait to hear back from [facility] regarding this. Lastly, Alla German would not allow CSW to send referral to [facility]. CSW did share with Alla German that transport to any of these facilities will likely require private pay for transport upfront.    Roddie Mc MSW, [place], [place], [phone]

## 2015-05-31 NOTE — Progress Notes (Signed)
[facility] in [place] denied pt. [facility] has ltac bed. Have spoken w dr Tyson Alias and  He feels pt can be disch. Spoke w son Alla German to let him know 3 ltac have turned his dad dow. [facility] vent snf do not have bed. Explained to son and to pt w sw that no vent snf beds avail in [place] and no ltac beds other than [facility]. Pt wanted to know what pierre had said and we explained he had want bed search for vent snf near [place]. Pt has brother in [place] and other fam in [place]. Pt thinking about whether he will go to [facility] lt[facility] or not.

## 2015-06-01 ENCOUNTER — Inpatient Hospital Stay (HOSPITAL_COMMUNITY): Payer: Medicare Other

## 2015-06-01 DIAGNOSIS — J9811 Atelectasis: Secondary | ICD-10-CM

## 2015-06-01 LAB — BLOOD GAS, ARTERIAL
Acid-Base Excess: 3.3 mmol/L — ABNORMAL HIGH (ref 0.0–2.0)
Bicarbonate: 26.3 mEq/L — ABNORMAL HIGH (ref 20.0–24.0)
DRAWN BY: 441371
FIO2: 0.5
MECHVT: 620 mL
O2 Saturation: 95.9 %
PEEP: 5 cmH2O
PO2 ART: 81 mmHg (ref 80.0–100.0)
Patient temperature: 98.6
RATE: 20 resp/min
TCO2: 27.4 mmol/L (ref 0–100)
pCO2 arterial: 33.3 mmHg — ABNORMAL LOW (ref 35.0–45.0)
pH, Arterial: 7.51 — ABNORMAL HIGH (ref 7.350–7.450)

## 2015-06-01 LAB — GLUCOSE, CAPILLARY
GLUCOSE-CAPILLARY: 188 mg/dL — AB (ref 65–99)
GLUCOSE-CAPILLARY: 189 mg/dL — AB (ref 65–99)
GLUCOSE-CAPILLARY: 193 mg/dL — AB (ref 65–99)
GLUCOSE-CAPILLARY: 193 mg/dL — AB (ref 65–99)
Glucose-Capillary: 203 mg/dL — ABNORMAL HIGH (ref 65–99)
Glucose-Capillary: 206 mg/dL — ABNORMAL HIGH (ref 65–99)

## 2015-06-01 MED ORDER — SODIUM CHLORIDE 0.9 % IV SOLN
500.0000 mg | Freq: Four times a day (QID) | INTRAVENOUS | Status: AC
  Administered 2015-06-01 – 2015-06-15 (×54): 500 mg via INTRAVENOUS
  Filled 2015-06-01 (×60): qty 500

## 2015-06-01 MED ORDER — DEXTROSE 5 % IV SOLN
2.0000 g | Freq: Three times a day (TID) | INTRAVENOUS | Status: DC
  Filled 2015-06-01 (×3): qty 2

## 2015-06-01 MED ORDER — VANCOMYCIN HCL IN DEXTROSE 750-5 MG/150ML-% IV SOLN
750.0000 mg | Freq: Two times a day (BID) | INTRAVENOUS | Status: DC
Start: 2015-06-02 — End: 2015-06-03
  Administered 2015-06-02 – 2015-06-03 (×3): 750 mg via INTRAVENOUS
  Filled 2015-06-01 (×4): qty 150

## 2015-06-01 MED ORDER — VANCOMYCIN HCL IN DEXTROSE 1-5 GM/200ML-% IV SOLN
1000.0000 mg | Freq: Once | INTRAVENOUS | Status: AC
  Administered 2015-06-01: 1000 mg via INTRAVENOUS
  Filled 2015-06-01: qty 200
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
## 2015-06-01 NOTE — Clinical Social Work Note (Addendum)
CSW received call from Jan (Scientist, research (physical sciences)) at [facility]. Facility states that they are unable to offer the patient a bed at their facility. CSW has left message for Alla German stating denial from [facility]. Patient's only vent SNF option continues to be [facility].    UPDATE 3:24PM: CSW attempted to reach patient's son Alla German again, CSW unable to leave voicemail as his "voicemail box is full."   Roddie Mc MSW, [place], [place], [phone]

## 2015-06-01 NOTE — Care Management Note (Signed)
Case Management Note  Patient Details  Name: Anthony Avila MRN: [id number] Date of Birth: 10-Sep-1945  Subjective/Objective:   Read and explained HINN document to pt, provided pt with document and placed copy in shadow chart.  Per CSW, pt has a bed @ [facility], has no other bed offers.                           Expected Discharge Plan:  Skilled Nursing Facility  In-House Referral:  Clinical Social Work  Discharge planning Services  CM Consult  Status of Service:  In process, will continue to follow  Medicare Important Message Given:  Yes-third notification given  Magdalene River, RN 06/01/2015, 3:47 PM

## 2015-06-01 NOTE — Progress Notes (Signed)
We have been notified of an appeal of discharge this evening by Elnita Maxwell at [facility]. We will proceed with providing documents in the morning and continue to follow.

## 2015-06-01 NOTE — Care Management Note (Signed)
Case Management Note  Patient Details  Name: Anthony Avila MRN: [id number] Date of Birth: 24-Nov-1945  Subjective/Objective:    Received call from Clydie Braun @ [facility] who requested CM call son to explain appeal process and provide contact number for [facility].  CM called telephone number for son that was provided by pt's brother, no answer x 2.  Left voice mail message providing contact and appeal process information, also provided CM telephone number if son has any questions about process.                             Expected Discharge Plan:  Skilled Nursing Facility  In-House Referral:  Clinical Social Work  Discharge planning Services  CM Consult  Status of Service:  In process, will continue to follow  Medicare Important Message Given:  Yes-third notification given  Magdalene River, RN 06/01/2015, 10:06 AM

## 2015-06-01 NOTE — Clinical Social Work Note (Signed)
CSW received call from Berneta Levins (Director of nursing) at [facility]. Onalee Hua states that he has been trying to contact the patient's son Alla German to address Pierre's concerns about the wound the patient has. Per Onalee Hua, the patient came to [facility] with the wound. Onalee Hua asks that CSW relay this information to Alla German once CSW is able to reach [facility]. At this time, the patient's only SNF option continues to be [facility].     Roddie Mc MSW, [place], [place], [phone]

## 2015-06-01 NOTE — Progress Notes (Signed)
Physical Therapy Wound Treatment Patient Details  Name: Deontre Allsup MRN: [id number] Date of Birth: 09-16-1945  Today's Date: 06/01/2015 Time: 1005-1038 Time Calculation (min): 33 min  Subjective  Subjective: Pt nodding ok when asked if we could do his hydrotherapy. Patient and Family Stated Goals: Not stated due to trach/vent Date of Onset:  (prior to admission) Prior Treatments: Dressing change  Pain Score: Pain Score: Premedicated for anxiety. Pt appears comfortable throughout.  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 white drainage along with blood with foul smelling odor, bone palpable (Active)  Dressing Type ABD;Barrier Film (skin prep);Gauze (Comment);Moist to dry;Other (Comment) 06/01/2015 10:08 AM  Dressing Changed;Clean;Dry;Intact 06/01/2015 10:08 AM  Dressing Change Frequency Daily 06/01/2015 10:08 AM  State of Healing Early/partial granulation 06/01/2015 10:08 AM  Site / Wound Assessment Yellow;Red;Pink 06/01/2015 10:08 AM  % Wound base Red or Granulating 70% 06/01/2015 10:08 AM  % Wound base Yellow 30% 06/01/2015 10:08 AM  % Wound base Black 0% 06/01/2015 10:08 AM  % Wound base Other (Comment) 0% 06/01/2015 10:08 AM  Peri-wound Assessment Intact 06/01/2015 10:08 AM  Wound Length (cm) 10 cm 06/01/2015 10:08 AM  Wound Width (cm) 9 cm 06/01/2015 10:08 AM  Wound Depth (cm) 3 cm 06/01/2015 10:08 AM  Undermining (cm) 3.5 from 10-2 oclock 06/01/2015 10:08 AM  Margins Unattached edges (unapproximated) 06/01/2015 10:08 AM  Drainage Amount Moderate 06/01/2015 10:08 AM  Drainage Description Odor;Serosanguineous;Purulent 06/01/2015 10:08 AM  Treatment Debridement (Selective);Hydrotherapy (Pulse lavage);Packing (Saline gauze) 06/01/2015 10:08 AM   Hydrotherapy Pulsed lavage therapy - wound location: sacrum Pulsed Lavage with Suction (psi): 8 psi (4-8) Pulsed Lavage with Suction - Normal Saline Used: 1000  mL Pulsed Lavage Tip: Tip with splash shield Selective Debridement Selective Debridement - Location: sacrum Selective Debridement - Tools Used: Forceps;Scissors Selective Debridement - Tissue Removed: yellow slough   Wound Assessment and Plan  Wound Therapy - Assess/Plan/Recommendations Wound Therapy - Clinical Statement: Slow progress with removal of necrotic tissue. Wound Therapy - Functional Problem List: Decreased sitting due to pressure sore Factors Delaying/Impairing Wound Healing: Altered sensation;Incontinence;Immobility;Multiple medical problems;Polypharmacy Hydrotherapy Plan: Debridement;Dressing change;Patient/family education;Pulsatile lavage with suction Wound Therapy - Frequency: 6X / week Wound Therapy - Follow Up Recommendations: Other (comment) (LTACH) Wound Plan: Hydrotherapy and selective debridement 6x/week.  Wound Therapy Goals- Improve the function of patient's integumentary system by progressing the wound(s) through the phases of wound healing (inflammation - proliferation - remodeling) by: Decrease Necrotic Tissue to: 25 Decrease Necrotic Tissue - Progress: Revised due to lack of progress Increase Granulation Tissue to: 75 Increase Granulation Tissue - Progress: Revised due to lack of progress  Goals will be updated until maximal potential achieved or discharge criteria met.  Discharge criteria: when goals achieved, discharge from hospital, MD decision/surgical intervention, no progress towards goals, refusal/missing three consecutive treatments without notification or medical reason.  GP     Leeah Politano 06/01/2015, 10:45 AM Suanne Marker PT [phone]
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
## 2015-06-02 ENCOUNTER — Inpatient Hospital Stay (HOSPITAL_COMMUNITY): Payer: Medicare Other

## 2015-06-02 DIAGNOSIS — J9690 Respiratory failure, unspecified, unspecified whether with hypoxia or hypercapnia: Secondary | ICD-10-CM | POA: Insufficient documentation

## 2015-06-02 DIAGNOSIS — J9811 Atelectasis: Secondary | ICD-10-CM | POA: Insufficient documentation

## 2015-06-02 DIAGNOSIS — J9621 Acute and chronic respiratory failure with hypoxia: Secondary | ICD-10-CM

## 2015-06-02 LAB — GLUCOSE, CAPILLARY
GLUCOSE-CAPILLARY: 150 mg/dL — AB (ref 65–99)
GLUCOSE-CAPILLARY: 196 mg/dL — AB (ref 65–99)
GLUCOSE-CAPILLARY: 201 mg/dL — AB (ref 65–99)
GLUCOSE-CAPILLARY: 203 mg/dL — AB (ref 65–99)
Glucose-Capillary: 184 mg/dL — ABNORMAL HIGH (ref 65–99)
Glucose-Capillary: 190 mg/dL — ABNORMAL HIGH (ref 65–99)

## 2015-06-02 LAB — CBC WITH DIFFERENTIAL/PLATELET
BASOS ABS: 0 10*3/uL (ref 0.0–0.1)
BASOS PCT: 0 %
EOS ABS: 0.2 10*3/uL (ref 0.0–0.7)
Eosinophils Relative: 2 %
HCT: 26 % — ABNORMAL LOW (ref 39.0–52.0)
HEMOGLOBIN: 7.7 g/dL — AB (ref 13.0–17.0)
Lymphocytes Relative: 13 %
Lymphs Abs: 1.5 10*3/uL (ref 0.7–4.0)
MCH: 24.8 pg — ABNORMAL LOW (ref 26.0–34.0)
MCHC: 29.6 g/dL — AB (ref 30.0–36.0)
MCV: 83.6 fL (ref 78.0–100.0)
MONOS PCT: 6 %
Monocytes Absolute: 0.7 10*3/uL (ref 0.1–1.0)
NEUTROS PCT: 79 %
Neutro Abs: 8.8 10*3/uL — ABNORMAL HIGH (ref 1.7–7.7)
Platelets: 369 10*3/uL (ref 150–400)
RBC: 3.11 MIL/uL — AB (ref 4.22–5.81)
RDW: 16.1 % — ABNORMAL HIGH (ref 11.5–15.5)
WBC: 11.2 10*3/uL — AB (ref 4.0–10.5)

## 2015-06-02 LAB — COMPREHENSIVE METABOLIC PANEL
ALBUMIN: 2.2 g/dL — AB (ref 3.5–5.0)
ALT: 43 U/L (ref 17–63)
ANION GAP: 12 (ref 5–15)
AST: 44 U/L — AB (ref 15–41)
Alkaline Phosphatase: 93 U/L (ref 38–126)
BUN: 22 mg/dL — AB (ref 6–20)
CHLORIDE: 99 mmol/L — AB (ref 101–111)
CO2: 25 mmol/L (ref 22–32)
Calcium: 8.8 mg/dL — ABNORMAL LOW (ref 8.9–10.3)
Creatinine, Ser: 0.75 mg/dL (ref 0.61–1.24)
GFR calc Af Amer: >60 mL/min (ref >60)
GFR calc non Af Amer: >60 mL/min (ref >60)
GLUCOSE: 167 mg/dL — AB (ref 65–99)
POTASSIUM: 3.7 mmol/L (ref 3.5–5.1)
Sodium: 136 mmol/L (ref 135–145)
Total Bilirubin: 0.4 mg/dL (ref 0.3–1.2)
Total Protein: 6.1 g/dL — ABNORMAL LOW (ref 6.5–8.1)

## 2015-06-02 LAB — PROCALCITONIN: PROCALCITONIN: 0.12 ng/mL

## 2015-06-02 MED ORDER — INSULIN GLARGINE 100 UNIT/ML ~~LOC~~ SOLN
10.0000 [IU] | Freq: Every day | SUBCUTANEOUS | Status: DC
  Administered 2015-06-02 – 2015-06-09 (×8): 10 [IU] via SUBCUTANEOUS
  Filled 2015-06-02 (×9): qty 0.1

## 2015-06-02 NOTE — Progress Notes (Signed)
Pt did not receive CPT this round. Pt had just been turned to left side and was sleeping. First round CPT was done around 2230. CPT will be complete @4am  by RT.

## 2015-06-02 NOTE — Clinical Social Work Note (Signed)
[facility] The facility still has the patient's bed and they state that they will be able to accept him at discharge. CSW has still not received a response from the patient's son [name]. At this point CSW will communicate with the patient's brother and patient. CSW notes that the discharge has been appealed.   Roddie McBryant Skylier Kretschmer MSW, [place] Creek[place]CS[place], [place], [phone]

## 2015-06-02 NOTE — Care Management Note (Signed)
Case Management Note  Patient Details  Name: Anthony Avila MRN: [id number] Date of Birth: 04/22/1946  Subjective/Objective:    CM spoke with [facility] liaison to verify that [facility] vent [facility]F does pulse lavage at their facility.                            Expected Discharge Plan:  Skilled Nursing Facility  In-House Referral:  Clinical Social Work  Discharge planning Services  CM Consult  Status of Service:  In process, will continue to follow  Medicare Important Message Given:  Yes-third notification given  Magdalene RiverMayo, Robby Pirani T, RN 06/02/2015, 11:59 AM

## 2015-06-02 NOTE — Progress Notes (Signed)
Physical Therapy Wound Treatment Patient Details  Name: Cuinn Westerhold MRN: [id number] Date of Birth: May 22, 1946  Today's Date: 06/02/2015 Time: 0933-1000 Time Calculation (min): 27 min  Subjective  Subjective: Requests more pain and anti-axiety medication. RN notified. Patient and Family Stated Goals: Not stated due to trach/vent Date of Onset:  (prior to admission) Prior Treatments: Dressing change  Pain Score:   Requests more pain medication. 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 white drainage along with blood with foul smelling odor, bone palpable (Active)  Dressing Type ABD;Barrier Film (skin prep);Gauze (Comment);Moist to dry;Other (Comment) 06/02/2015 11:14 AM  Dressing Changed;Clean;Dry;Intact 06/02/2015 11:14 AM  Dressing Change Frequency Daily 06/02/2015 11:14 AM  State of Healing Early/partial granulation 06/02/2015 11:14 AM  Site / Wound Assessment Yellow;Red;Pink 06/02/2015 11:14 AM  % Wound base Red or Granulating 70% 06/02/2015 11:14 AM  % Wound base Yellow 30% 06/02/2015 11:14 AM  % Wound base Black 0% 06/02/2015 11:14 AM  % Wound base Other (Comment) 0% 06/02/2015 11:14 AM  Peri-wound Assessment Intact 06/02/2015 11:14 AM  Wound Length (cm) 10 cm 06/01/2015 10:08 AM  Wound Width (cm) 9 cm 06/01/2015 10:08 AM  Wound Depth (cm) 3 cm 06/01/2015 10:08 AM  Undermining (cm) 3.5 from 10-2 oclock 06/01/2015 10:08 AM  Margins Unattached edges (unapproximated) 06/02/2015 11:14 AM  Drainage Amount Moderate 06/02/2015 11:14 AM  Drainage Description Odor;Serosanguineous;Purulent 06/02/2015 11:14 AM  Treatment Cleansed;Debridement (Selective);Hydrotherapy (Pulse lavage);Packing (Saline gauze);Other (Comment);Tape changed 06/02/2015 11:14 AM  Santyl applied to wound bed prior to applying dressing.  Hydrotherapy Pulsed lavage therapy - wound location: sacrum Pulsed Lavage with Suction  (psi): 8 psi (4-8) Pulsed Lavage with Suction - Normal Saline Used: 1000 mL Pulsed Lavage Tip: Tip with splash shield   Wound Assessment and Plan  Wound Therapy - Assess/Plan/Recommendations Wound Therapy - Clinical Statement: Still with early granulation mixed with approx 30% yellow slough and eschar. Continue with currenty hyrdotherapy plan Wound Therapy - Functional Problem List: Decreased sitting due to pressure sore Factors Delaying/Impairing Wound Healing: Altered sensation;Incontinence;Immobility;Multiple medical problems;Polypharmacy Hydrotherapy Plan: Debridement;Dressing change;Patient/family education;Pulsatile lavage with suction Wound Therapy - Frequency: 6X / week Wound Therapy - Follow Up Recommendations: Other (comment) (LTACH) Wound Plan: Hydrotherapy and selective debridement 6x/week.  Wound Therapy Goals- Improve the function of patient's integumentary system by progressing the wound(s) through the phases of wound healing (inflammation - proliferation - remodeling) by: Decrease Necrotic Tissue to: 25 Decrease Necrotic Tissue - Progress: Progressing toward goal Increase Granulation Tissue to: 75 Increase Granulation Tissue - Progress: Progressing toward goal  Goals will be updated until maximal potential achieved or discharge criteria met.  Discharge criteria: when goals achieved, discharge from hospital, MD decision/surgical intervention, no progress towards goals, refusal/missing three consecutive treatments without notification or medical reason.  GP     [name] 06/02/2015, 11:18 AM [name], [place]
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
## 2015-06-02 NOTE — Progress Notes (Signed)
Inpatient Diabetes Program Recommendations  AACE/ADA: New Consensus Statement on Inpatient Glycemic Control (2015)  Target Ranges:  Prepandial:   less than 140 mg/dL      Peak postprandial:   less than 180 mg/dL (1-2 hours)      Critically ill patients:  140 - 180 mg/dL   Review of Glycemic Control  Inpatient Diabetes Program Recommendations:  Insulin - Basal: add Lantus 10 units   Thank you  Jun Rightmyer BSN, RN,CDE Inpatient Diabetes Coordinator [phone] (team pager)   

## 2015-06-03 ENCOUNTER — Inpatient Hospital Stay (HOSPITAL_COMMUNITY): Payer: Medicare Other

## 2015-06-03 LAB — GLUCOSE, CAPILLARY
GLUCOSE-CAPILLARY: 150 mg/dL — AB (ref 65–99)
GLUCOSE-CAPILLARY: 159 mg/dL — AB (ref 65–99)
GLUCOSE-CAPILLARY: 199 mg/dL — AB (ref 65–99)
GLUCOSE-CAPILLARY: 154 mg/dL — AB (ref 65–99)
Glucose-Capillary: 158 mg/dL — ABNORMAL HIGH (ref 65–99)
Glucose-Capillary: 152 mg/dL — ABNORMAL HIGH (ref 65–99)

## 2015-06-03 LAB — BASIC METABOLIC PANEL
Anion gap: 8 (ref 5–15)
BUN: 16 mg/dL (ref 6–20)
CHLORIDE: 100 mmol/L — AB (ref 101–111)
CO2: 26 mmol/L (ref 22–32)
CREATININE: 0.62 mg/dL (ref 0.61–1.24)
Calcium: 8.6 mg/dL — ABNORMAL LOW (ref 8.9–10.3)
GFR calc non Af Amer: >60 mL/min (ref >60)
GLUCOSE: 147 mg/dL — AB (ref 65–99)
Potassium: 3.5 mmol/L (ref 3.5–5.1)
Sodium: 134 mmol/L — ABNORMAL LOW (ref 135–145)

## 2015-06-03 LAB — CBC
HEMATOCRIT: 25.7 % — AB (ref 39.0–52.0)
HEMOGLOBIN: 7.8 g/dL — AB (ref 13.0–17.0)
MCH: 25.1 pg — AB (ref 26.0–34.0)
MCHC: 30.4 g/dL (ref 30.0–36.0)
MCV: 82.6 fL (ref 78.0–100.0)
Platelets: 362 10*3/uL (ref 150–400)
RBC: 3.11 MIL/uL — ABNORMAL LOW (ref 4.22–5.81)
RDW: 16 % — AB (ref 11.5–15.5)
WBC: 7.7 10*3/uL (ref 4.0–10.5)

## 2015-06-03 LAB — MAGNESIUM: MAGNESIUM: 1.8 mg/dL (ref 1.7–2.4)

## 2015-06-03 LAB — PROCALCITONIN: Procalcitonin: <0.1 ng/mL

## 2015-06-03 LAB — PHOSPHORUS: Phosphorus: 2.9 mg/dL (ref 2.5–4.6)
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
## 2015-06-03 NOTE — Progress Notes (Signed)
Physical Therapy Wound Treatment Patient Details  Name: Anthony Avila MRN: [id number] Date of Birth: 10/21/1945  Today's Date: 06/03/2015 Time: 0902-0933 Time Calculation (min): 31 min  Subjective  Subjective: Requests medication. RN notified and administered. Patient and Family Stated Goals: Not stated due to trach/vent Date of Onset:  (prior to admission) Prior Treatments: Dressing change  Pain Score: Does not give numerical value; CPOT 2/8. Requests pain and anti-anxiety medication. RN notified and administered.  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 white drainage along with blood with foul smelling odor, bone palpable (Active)  Dressing Type ABD;Barrier Film (skin prep);Gauze (Comment);Moist to dry;Other (Comment);Tape dressing 06/03/2015  9:54 AM  Dressing Changed;Clean;Dry;Intact 06/03/2015  9:54 AM  Dressing Change Frequency Daily 06/03/2015  9:54 AM  State of Healing Early/partial granulation 06/03/2015  9:54 AM  Site / Wound Assessment Yellow;Red;Pink 06/03/2015  9:54 AM  % Wound base Red or Granulating 70% 06/03/2015  9:54 AM  % Wound base Yellow 30% 06/03/2015  9:54 AM  % Wound base Black 0% 06/03/2015  9:54 AM  % Wound base Other (Comment) 0% 06/03/2015  9:54 AM  Peri-wound Assessment Intact 06/03/2015  9:54 AM  Wound Length (cm) 10 cm 06/01/2015 10:08 AM  Wound Width (cm) 9 cm 06/01/2015 10:08 AM  Wound Depth (cm) 3 cm 06/01/2015 10:08 AM  Undermining (cm) 3.5 from 10-2 oclock 06/01/2015 10:08 AM  Margins Unattached edges (unapproximated) 06/03/2015  9:54 AM  Drainage Amount Moderate 06/03/2015  9:54 AM  Drainage Description Odor;Serosanguineous;Purulent 06/03/2015  9:54 AM  Treatment Cleansed;Debridement (Selective);Hydrotherapy (Pulse lavage);Packing (Saline gauze);Tape changed;Other (Comment) 06/03/2015  9:54 AM  Santyl applied to wound bed prior to applying  dressing.  Hydrotherapy Pulsed lavage therapy - wound location: sacrum Pulsed Lavage with Suction (psi): 8 psi (4-8) Pulsed Lavage with Suction - Normal Saline Used: 1000 mL Pulsed Lavage Tip: Tip with splash shield Selective Debridement Selective Debridement - Location: sacrum Selective Debridement - Tools Used: Forceps;Scissors Selective Debridement - Tissue Removed: yellow slough   Wound Assessment and Plan  Wound Therapy - Assess/Plan/Recommendations Wound Therapy - Clinical Statement: Slough loosening in many areas. Santyl to assist with debridement. Wound bed pink with some red granulation around perimeter. Wound Therapy - Functional Problem List: Decreased sitting due to pressure sore Factors Delaying/Impairing Wound Healing: Altered sensation;Incontinence;Immobility;Multiple medical problems;Polypharmacy Hydrotherapy Plan: Debridement;Dressing change;Patient/family education;Pulsatile lavage with suction Wound Therapy - Frequency: 6X / week Wound Therapy - Follow Up Recommendations: Other (comment) (LTACH) Wound Plan: Hydrotherapy and selective debridement 6x/week.  Wound Therapy Goals- Improve the function of patient's integumentary system by progressing the wound(s) through the phases of wound healing (inflammation - proliferation - remodeling) by: Decrease Necrotic Tissue to: 25 Decrease Necrotic Tissue - Progress: Progressing toward goal Increase Granulation Tissue to: 75 Increase Granulation Tissue - Progress: Progressing toward goal  Goals will be updated until maximal potential achieved or discharge criteria met.  Discharge criteria: when goals achieved, discharge from hospital, MD decision/surgical intervention, no progress towards goals, refusal/missing three consecutive treatments without notification or medical reason.  GP     ,  S 06/03/2015, 9:59 AM   [place] , PT [phone]   

## 2015-06-03 NOTE — Progress Notes (Signed)
10/13/2016[name] Trach team respiratory care consult.  Pt continues on full support.  Ventilator rate weaned to a rate of 12 this am.  Trach care supplies at bedside.  Continuing to follow pt progress.  Possible d/c to snf in the future.  No respiratory issues noted at time of eval this am.

## 2015-06-04 ENCOUNTER — Encounter (HOSPITAL_COMMUNITY): Payer: Self-pay | Admitting: *Deleted

## 2015-06-04 LAB — CULTURE, RESPIRATORY W GRAM STAIN

## 2015-06-04 LAB — GLUCOSE, CAPILLARY
GLUCOSE-CAPILLARY: 130 mg/dL — AB (ref 65–99)
GLUCOSE-CAPILLARY: 151 mg/dL — AB (ref 65–99)
GLUCOSE-CAPILLARY: 159 mg/dL — AB (ref 65–99)
GLUCOSE-CAPILLARY: 160 mg/dL — AB (ref 65–99)
GLUCOSE-CAPILLARY: 175 mg/dL — AB (ref 65–99)
Glucose-Capillary: 142 mg/dL — ABNORMAL HIGH (ref 65–99)
Glucose-Capillary: 191 mg/dL — ABNORMAL HIGH (ref 65–99)

## 2015-06-04 LAB — BASIC METABOLIC PANEL
ANION GAP: 9 (ref 5–15)
BUN: 17 mg/dL (ref 6–20)
CHLORIDE: 102 mmol/L (ref 101–111)
CO2: 27 mmol/L (ref 22–32)
CREATININE: 0.63 mg/dL (ref 0.61–1.24)
Calcium: 9.1 mg/dL (ref 8.9–10.3)
GFR calc non Af Amer: >60 mL/min (ref >60)
Glucose, Bld: 165 mg/dL — ABNORMAL HIGH (ref 65–99)
POTASSIUM: 3.9 mmol/L (ref 3.5–5.1)
SODIUM: 138 mmol/L (ref 135–145)

## 2015-06-04 LAB — CULTURE, RESPIRATORY

## 2015-06-04 LAB — PROCALCITONIN: Procalcitonin: <0.1 ng/mL

## 2015-06-04 NOTE — Progress Notes (Signed)
Pt. Refused 12:00 CPT due to being in pain.

## 2015-06-04 NOTE — Clinical Social Work Note (Signed)
Stacy with [facility] confirms that [facility] still has a bed available for the patient and that the facility will have a bed for the patient on Monday.    Roddie McBryant Lizbet Cirrincione MSW, [place]CS[place], [place], [phone]

## 2015-06-04 NOTE — Progress Notes (Signed)
Physical Therapy Wound Treatment Patient Details  Name: Anthony Avila MRN: [id number] Date of Birth: [birth year]  Today'Avila Date: 06/04/2015 Time: 0350-0938 Time Calculation (min): 40 min  Subjective  Subjective: States that he slept the best he has, in months. Patient and Family Stated Goals: Not stated due to trach/vent Date of Onset:  (prior to admission) Prior Treatments: Dressing change  Pain Score: "I'm okay, I've already had my medication." No value given for pain. 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 white drainage along with blood with foul smelling odor, bone palpable (Active)  Dressing Type ABD;Barrier Film (skin prep);Gauze (Comment);Moist to dry;Other (Comment);Tape dressing 06/04/2015 10:00 AM  Dressing Changed;Clean;Dry;Intact 06/04/2015 10:00 AM  Dressing Change Frequency Daily 06/04/2015 10:00 AM  State of Healing Early/partial granulation 06/04/2015 10:00 AM  Site / Wound Assessment Yellow;Red;Pink 06/04/2015 10:00 AM  % Wound base Red or Granulating 75% 06/04/2015 10:00 AM  % Wound base Yellow 25% 06/04/2015 10:00 AM  % Wound base Black 0% 06/04/2015 10:00 AM  % Wound base Other (Comment) 0% 06/04/2015 10:00 AM  Peri-wound Assessment Intact 06/04/2015 10:00 AM  Wound Length (cm) 10 cm 06/01/2015 10:08 AM  Wound Width (cm) 9 cm 06/01/2015 10:08 AM  Wound Depth (cm) 3 cm 06/01/2015 10:08 AM  Undermining (cm) 3.5 from 10-2 oclock 06/01/2015 10:08 AM  Margins Unattached edges (unapproximated) 06/04/2015 10:00 AM  Drainage Amount Moderate 06/04/2015 10:00 AM  Drainage Description Odor;Serosanguineous;Purulent 06/04/2015 10:00 AM  Treatment Cleansed;Debridement (Selective);Hydrotherapy (Pulse lavage);Packing (Saline gauze);Tape changed;Other (Comment) 06/04/2015 10:00 AM  Santyl applied to wound bed prior to applying dressing.\ Hydrotherapy Pulsed lavage therapy - wound location:  sacrum Pulsed Lavage with Suction (psi): 8 psi (4-8) Pulsed Lavage with Suction - Normal Saline Used: 1000 mL Pulsed Lavage Tip: Tip with splash shield Selective Debridement Selective Debridement - Location: sacrum Selective Debridement - Tools Used: Forceps;Scissors Selective Debridement - Tissue Removed: yellow slough   Wound Assessment and Plan  Wound Therapy - Assess/Plan/Recommendations Wound Therapy - Clinical Statement: Making progress. Less slough today. Continue with current plan of care. Wound Therapy - Functional Problem List: Decreased sitting due to pressure sore Factors Delaying/Impairing Wound Healing: Altered sensation;Incontinence;Immobility;Multiple medical problems;Polypharmacy Hydrotherapy Plan: Debridement;Dressing change;Patient/family education;Pulsatile lavage with suction Wound Therapy - Frequency: 6X / week Wound Therapy - Follow Up Recommendations: Other (comment) (LTACH) Wound Plan: Hydrotherapy and selective debridement 6x/week.  Wound Therapy Goals- Improve the function of patient'Avila integumentary system by progressing the wound(Avila) through the phases of wound healing (inflammation - proliferation - remodeling) by: Decrease Necrotic Tissue to: 15 Decrease Necrotic Tissue - Progress: Updated due to goal met Increase Granulation Tissue to: 85 Increase Granulation Tissue - Progress: Updated due to goal met  Goals will be updated until maximal potential achieved or discharge criteria met.  Discharge criteria: when goals achieved, discharge from hospital, MD decision/surgical intervention, no progress towards goals, refusal/missing three consecutive treatments without notification or medical reason.  GP     Anthony Avila 06/04/2015, 10:42 AM  Anthony Avila, [place]

## 2015-06-04 NOTE — Care Management Important Message (Signed)
Important Message  Patient Details  Name: Grafton FolkWayne Mudrick MRN: [id number] Date of Birth: 08/22/1945   Medicare Important Message Given:  Yes-fourth notification given    Orson AloeMegan P Yarrow Linhart 06/04/2015, 10:55 AM

## 2015-06-04 NOTE — Progress Notes (Signed)
Called to pts room by RN. Pt stated be had a wheeze coming from his trach. I placed 3cc air in trach and the wheeze went away
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
## 2015-06-05 DIAGNOSIS — J9611 Chronic respiratory failure with hypoxia: Secondary | ICD-10-CM

## 2015-06-05 LAB — GLUCOSE, CAPILLARY
GLUCOSE-CAPILLARY: 147 mg/dL — AB (ref 65–99)
GLUCOSE-CAPILLARY: 149 mg/dL — AB (ref 65–99)
GLUCOSE-CAPILLARY: 173 mg/dL — AB (ref 65–99)
GLUCOSE-CAPILLARY: 178 mg/dL — AB (ref 65–99)
Glucose-Capillary: 181 mg/dL — ABNORMAL HIGH (ref 65–99)
Glucose-Capillary: 176 mg/dL — ABNORMAL HIGH (ref 65–99)

## 2015-06-05 MED ORDER — WHITE PETROLATUM GEL
Status: AC
  Administered 2015-06-05: 21:00:00
  Filled 2015-06-05: qty 1
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
## 2015-06-05 NOTE — Progress Notes (Signed)
Physical Therapy Wound Treatment Patient Details  Name: Anthony Avila MRN: [id number] Date of Birth: 1946/07/10  Today's Date: 06/05/2015 Time:10:50-11:30 AM   Subjective  Subjective: No new complaints. Premedicated IV by RN before wound care. 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 white drainage along with blood with foul smelling odor, bone palpable (Active)  Dressing Type ABD;Barrier Film (skin prep);Gauze (Comment);Tape dressing;Moist to dry 06/05/2015 11:35 AM  Dressing Changed;Clean 06/05/2015 11:35 AM  Dressing Change Frequency Daily 06/05/2015 11:35 AM  State of Healing Early/partial granulation 06/05/2015 11:35 AM  Site / Wound Assessment Yellow;Red;Pink 06/05/2015 11:35 AM  % Wound base Red or Granulating 80% 06/05/2015 11:35 AM  % Wound base Yellow 20% 06/05/2015 11:35 AM  % Wound base Black 0% 06/05/2015 11:35 AM  % Wound base Other (Comment) 0% 06/05/2015 11:35 AM  Peri-wound Assessment Intact 06/05/2015 11:35 AM  Wound Length (cm) 10 cm 06/01/2015 10:08 AM  Wound Width (cm) 9 cm 06/01/2015 10:08 AM  Wound Depth (cm) 3 cm 06/01/2015 10:08 AM  Undermining (cm) 3.5 from 10-2 oclock 06/01/2015 10:08 AM  Margins Unattached edges (unapproximated) 06/05/2015 11:35 AM  Drainage Amount Moderate 06/05/2015 11:35 AM  Drainage Description Odor;Serosanguineous;Purulent 06/05/2015 11:35 AM  Treatment Cleansed;Debridement (Selective);Hydrotherapy (Pulse lavage) 06/05/2015 11:35 AM   Hydrotherapy Pulsed lavage therapy - wound location: sacrum Pulsed Lavage with Suction (psi): 8 psi (4-8 psi) Pulsed Lavage with Suction - Normal Saline Used: 1000 mL Pulsed Lavage Tip: Tip with splash shield Selective Debridement Selective Debridement - Location:  sacrum Selective Debridement - Tools Used: Forceps;Scissors Selective Debridement - Tissue Removed: yellow slough   Wound Assessment and Plan  Wound Therapy - Assess/Plan/Recommendations Wound Therapy - Clinical Statement: Making progress. Less slough today. Continue with current plan of care. Wound Therapy - Functional Problem List: Decreased sitting due to pressure sore Factors Delaying/Impairing Wound Healing: Altered sensation;Incontinence;Immobility;Multiple medical problems;Polypharmacy Hydrotherapy Plan: Debridement;Dressing change;Patient/family education;Pulsatile lavage with suction Wound Therapy - Frequency: 6X / week Wound Therapy - Follow Up Recommendations: Other (comment) (LTACH) Wound Plan: Hydrotherapy and selective debridement 6x/week.  Wound Therapy Goals- Improve the function of patient's integumentary system by progressing the wound(s) through the phases of wound healing (inflammation - proliferation - remodeling) by: Decrease Necrotic Tissue to: 15 Decrease Necrotic Tissue - Progress: Progressing toward goal Increase Granulation Tissue to: 85 Increase Granulation Tissue - Progress: Progressing toward goal  Goals will be updated until maximal potential achieved or discharge criteria met.  Discharge criteria: when goals achieved, discharge from hospital, MD decision/surgical intervention, no progress towards goals, refusal/missing three consecutive treatments without notification or medical reason.    [name] 06/05/2015, 11:49 AM  [name], PTA, CLT Acute Rehab Services [phone] 06/05/2015, 11:50 AM

## 2015-06-06 DIAGNOSIS — J9601 Acute respiratory failure with hypoxia: Secondary | ICD-10-CM

## 2015-06-06 LAB — CULTURE, BLOOD (ROUTINE X 2)
CULTURE: NO GROWTH
Culture: NO GROWTH

## 2015-06-06 LAB — GLUCOSE, CAPILLARY
GLUCOSE-CAPILLARY: 122 mg/dL — AB (ref 65–99)
GLUCOSE-CAPILLARY: 176 mg/dL — AB (ref 65–99)
GLUCOSE-CAPILLARY: 166 mg/dL — AB (ref 65–99)
GLUCOSE-CAPILLARY: 164 mg/dL — AB (ref 65–99)
Glucose-Capillary: 145 mg/dL — ABNORMAL HIGH (ref 65–99)

## 2015-06-06 MED ORDER — CLONIDINE HCL 0.1 MG PO TABS
0.1000 mg | ORAL_TABLET | Freq: Three times a day (TID) | ORAL | Status: DC
  Administered 2015-06-06 – 2015-06-16 (×31): 0.1 mg
  Filled 2015-06-06 (×32): qty 1

## 2015-06-06 MED ORDER — FENTANYL 25 MCG/HR TD PT72
50.0000 ug | MEDICATED_PATCH | TRANSDERMAL | Status: DC
  Administered 2015-06-08: 50 ug via TRANSDERMAL
  Filled 2015-06-06: qty 2

## 2015-06-06 NOTE — Procedures (Addendum)
CPT held per pt and family request to sleep.
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
## 2015-06-06 NOTE — Procedures (Signed)
CPT held per pt's and family request.

## 2015-06-07 LAB — GLUCOSE, CAPILLARY
GLUCOSE-CAPILLARY: 222 mg/dL — AB (ref 65–99)
GLUCOSE-CAPILLARY: 219 mg/dL — AB (ref 65–99)
GLUCOSE-CAPILLARY: 232 mg/dL — AB (ref 65–99)
GLUCOSE-CAPILLARY: 203 mg/dL — AB (ref 65–99)
GLUCOSE-CAPILLARY: 243 mg/dL — AB (ref 65–99)
Glucose-Capillary: 202 mg/dL — ABNORMAL HIGH (ref 65–99)

## 2015-06-07 NOTE — Progress Notes (Signed)
Inpatient Diabetes Program Recommendations  AACE/ADA: New Consensus Statement on Inpatient Glycemic Control (2015)  Target Ranges:  Prepandial:   less than 140 mg/dL      Peak postprandial:   less than 180 mg/dL (1-2 hours)      Critically ill patients:  140 - 180 mg/dL   Review of Glycemic Control  Diabetes history: DM 2 Outpatient Diabetes medications: Lantus 13 units BID, Novolog 0-20 units TID Current orders for Inpatient glycemic control: Lantus 10 units QHS, Novolog Moderate TID  Inpatient Diabetes Program Recommendations: Insulin - Basal: Consider increasing basal insulin to BID Insulin - Meal Coverage: Patient receiving Vital AF 1.2  75 ml/hr. Glucose increased into the 200's. Please consider adding Tube Feed Coverage Novolog 3-5 units Q4hrs.  Thanks,  Christena DeemShannon Willis RN, MSN, [facility] Center[facility] Inpatient Diabetes Coordinator Team Pager [phone] (8a-5p)

## 2015-06-07 NOTE — Progress Notes (Signed)
[facility] for [facility]  CSW will continue to follow  Merlyn LotJenna Holoman, [facility]enneCSWA Clinical Social Worker [phone]

## 2015-06-07 NOTE — Progress Notes (Signed)
Minimal occluding

## 2015-06-07 NOTE — Progress Notes (Signed)
Physical Therapy Wound Treatment Patient Details  Name: Anthony Avila MRN: [id number] Date of Birth: July 02, 1946  Today's Date: 06/07/2015 Time: 1552-0802 Time Calculation (min): 38 min  Subjective  Subjective: Pt mouthing "My breathing is terrible." Pt had been suctioned by RN and vital signs stable. Date of Onset:  (prior to admission) Prior Treatments: Dressing change  Pain Score: Pain Score: Pt premedicated for anxiety/pain. C/O pain in neck with positioning.  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 white drainage along with blood with foul smelling odor, bone palpable (Active)  Dressing Type ABD;Barrier Film (skin prep);Gauze (Comment);Tape dressing;Moist to dry 06/07/2015  3:28 PM  Dressing Changed;Clean;Dry;Intact 06/07/2015  3:28 PM  Dressing Change Frequency Daily 06/07/2015  3:28 PM  State of Healing Early/partial granulation 06/07/2015  3:28 PM  Site / Wound Assessment Yellow;Red;Pink 06/07/2015  3:28 PM  % Wound base Red or Granulating 80% 06/07/2015  3:28 PM  % Wound base Yellow 20% 06/07/2015  3:28 PM  % Wound base Black 0% 06/07/2015  3:28 PM  % Wound base Other (Comment) 0% 06/07/2015  3:28 PM  Peri-wound Assessment Intact 06/07/2015  3:28 PM  Wound Length (cm) 10 cm 06/01/2015 10:08 AM  Wound Width (cm) 9 cm 06/01/2015 10:08 AM  Wound Depth (cm) 3 cm 06/01/2015 10:08 AM  Undermining (cm) 3.5 from 10-2 oclock 06/01/2015 10:08 AM  Margins Unattached edges (unapproximated) 06/07/2015  3:28 PM  Drainage Amount Moderate 06/07/2015  3:28 PM  Drainage Description Odor;Serosanguineous;Purulent 06/07/2015  3:28 PM  Treatment Hydrotherapy (Pulse lavage);Packing (Saline gauze) 06/07/2015  3:28 PM  Santyl applied to wound bed prior to applying dressing.  Hydrotherapy Pulsed lavage therapy - wound location: sacrum Pulsed Lavage with Suction (psi): 8 psi (4-8 psi) Pulsed Lavage with Suction - Normal  Saline Used: 1000 mL Pulsed Lavage Tip: Tip with splash shield   Wound Assessment and Plan  Wound Therapy - Assess/Plan/Recommendations Wound Therapy - Clinical Statement: Pt with incr anxiety today with positioning on side making treatment difficult. Pt had been premedicated for anxiety/pain. Wound Therapy - Functional Problem List: Decreased sitting due to pressure sore Factors Delaying/Impairing Wound Healing: Altered sensation;Incontinence;Immobility;Multiple medical problems;Polypharmacy Hydrotherapy Plan: Debridement;Dressing change;Patient/family education;Pulsatile lavage with suction Wound Therapy - Frequency: 6X / week Wound Therapy - Follow Up Recommendations: Other (comment) (LTACH) Wound Plan: See above  Wound Therapy Goals- Improve the function of patient's integumentary system by progressing the wound(s) through the phases of wound healing (inflammation - proliferation - remodeling) by: Decrease Necrotic Tissue to: 15 Decrease Necrotic Tissue - Progress: Progressing toward goal Increase Granulation Tissue to: 85 Increase Granulation Tissue - Progress: Progressing toward goal  Goals will be updated until maximal potential achieved or discharge criteria met.  Discharge criteria: when goals achieved, discharge from hospital, MD decision/surgical intervention, no progress towards goals, refusal/missing three consecutive treatments without notification or medical reason.  GP     [name] 06/07/2015, 3:38 PM [facility] PT [phone]
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
## 2015-06-07 NOTE — Progress Notes (Signed)
Spoke w dr Molli Knockyacoub. He is going to start weaning vent. He is not quit ready to send out of hospital. He feels may need a couple more days.

## 2015-06-07 NOTE — Progress Notes (Signed)
Pt feeling tired and requested to go back on full vent support.
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
## 2015-06-07 NOTE — Progress Notes (Addendum)
RT decreased FIO2 to 50%. Sat 99% Per MD verbal order: Started wean on vent CPAP 5 PS 10. Goal of trach collar and work on using PMV. Changed CPT to Q shift and only when not weaning.

## 2015-06-07 NOTE — Progress Notes (Signed)
RT did not suction per pt request, due to recent suctioning completed by RN.  RT asked RN to check on possible speech  Therapy consult for passy muir valve trial. Pt has passy muir valves in room.

## 2015-06-08 ENCOUNTER — Inpatient Hospital Stay (HOSPITAL_COMMUNITY): Payer: Medicare Other

## 2015-06-08 LAB — BASIC METABOLIC PANEL
ANION GAP: 8 (ref 5–15)
BUN: 19 mg/dL (ref 6–20)
CALCIUM: 9 mg/dL (ref 8.9–10.3)
CHLORIDE: 102 mmol/L (ref 101–111)
CO2: 28 mmol/L (ref 22–32)
Creatinine, Ser: 0.5 mg/dL — ABNORMAL LOW (ref 0.61–1.24)
GFR calc non Af Amer: >60 mL/min (ref >60)
Glucose, Bld: 173 mg/dL — ABNORMAL HIGH (ref 65–99)
Potassium: 4.1 mmol/L (ref 3.5–5.1)
Sodium: 138 mmol/L (ref 135–145)

## 2015-06-08 LAB — CBC
HCT: 27.8 % — ABNORMAL LOW (ref 39.0–52.0)
Hemoglobin: 8.4 g/dL — ABNORMAL LOW (ref 13.0–17.0)
MCH: 25.6 pg — ABNORMAL LOW (ref 26.0–34.0)
MCHC: 30.2 g/dL (ref 30.0–36.0)
MCV: 84.8 fL (ref 78.0–100.0)
Platelets: 367 K/uL (ref 150–400)
RBC: 3.28 MIL/uL — ABNORMAL LOW (ref 4.22–5.81)
RDW: 15.7 % — ABNORMAL HIGH (ref 11.5–15.5)
WBC: 12.2 K/uL — ABNORMAL HIGH (ref 4.0–10.5)

## 2015-06-08 LAB — PHOSPHORUS: PHOSPHORUS: 3.6 mg/dL (ref 2.5–4.6)

## 2015-06-08 LAB — GLUCOSE, CAPILLARY
GLUCOSE-CAPILLARY: 117 mg/dL — AB (ref 65–99)
GLUCOSE-CAPILLARY: 150 mg/dL — AB (ref 65–99)
GLUCOSE-CAPILLARY: 183 mg/dL — AB (ref 65–99)
GLUCOSE-CAPILLARY: 190 mg/dL — AB (ref 65–99)
GLUCOSE-CAPILLARY: 228 mg/dL — AB (ref 65–99)
Glucose-Capillary: 148 mg/dL — ABNORMAL HIGH (ref 65–99)
Glucose-Capillary: 187 mg/dL — ABNORMAL HIGH (ref 65–99)

## 2015-06-08 LAB — MAGNESIUM: Magnesium: 2 mg/dL (ref 1.7–2.4)

## 2015-06-08 MED ORDER — FUROSEMIDE 10 MG/ML IJ SOLN
40.0000 mg | Freq: Two times a day (BID) | INTRAMUSCULAR | Status: AC
  Administered 2015-06-08 – 2015-06-09 (×2): 40 mg via INTRAVENOUS
  Filled 2015-06-08 (×2): qty 4

## 2015-06-08 MED ORDER — ACETAMINOPHEN 325 MG PO TABS: 650.0000 mg | ORAL_TABLET | ORAL | Status: DC | PRN

## 2015-06-08 NOTE — Progress Notes (Signed)
RT Note: RT attempted to wean patient again. He was placed on CPAP/Ps wiith pressure support of 10 and patient was tolerating it very well. He was getting good volumes and was tolerating it well. But patient began to complain that he could not breathe although his numbers on the ventilator were within normal limits. Patient was placed back on his rest mode and is tolerating that well. RT discussed this with the nurse Cherly HensenBernadette who is aware and commented that patient has a lot of anxiety. Rt will continue to monitor.

## 2015-06-08 NOTE — Progress Notes (Signed)
Inpatient Diabetes Program Recommendations  AACE/ADA: New Consensus Statement on Inpatient Glycemic Control (2015)  Target Ranges:  Prepandial:   less than 140 mg/dL      Peak postprandial:   less than 180 mg/dL (1-2 hours)      Critically ill patients:  140 - 180 mg/dL   Review of Glycemic Control   Diabetes history: DM 2 Outpatient Diabetes medications: Lantus 13 units BID, Novolog 0-20 units TID Current orders for Inpatient glycemic control: Lantus 10 units QHS, Novolog Moderate TID  Inpatient Diabetes Program Recommendations:   Insulin - Meal Coverage: Patient receiving Vital AF 1.2  75 ml/hr. Glucose increased into the 200's. Please consider adding Tube Feed Coverage Novolog 5 units Q4hrs.  Thanks,  Christena DeemShannon Rockney Grenz RN, MSN, [facility] Inpatient Diabetes Coordinator Team Pager [phone] (8a-5p)
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
## 2015-06-08 NOTE — Care Management Important Message (Signed)
Important Message  Patient Details  Name: Anthony Avila MRN: [id number] Date of Birth: 09/18/1945   Medicare Important Message Given:  Yes-second notification given    [name], [name] 06/08/2015, 12:51 PM

## 2015-06-08 NOTE — Progress Notes (Signed)
Physical Therapy Wound Treatment Patient Details  Name: Anthony Avila MRN: [id number] Date of Birth: Apr 27, 1946  Today's Date: 06/08/2015 Time: 6834-1962 Time Calculation (min): 39 min  Subjective  Subjective: Pt mouthing, "I'm choking," when returned to back after treatment. VSS. Nurse in to suction. 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 white drainage along with blood with foul smelling odor, bone palpable (Active)  Dressing Type ABD;Barrier Film (skin prep);Gauze (Comment);Tape dressing;Moist to dry 06/08/2015  8:53 AM  Dressing Changed;Clean;Dry;Intact 06/08/2015  8:53 AM  Dressing Change Frequency Daily 06/08/2015  8:53 AM  State of Healing Early/partial granulation 06/08/2015  8:53 AM  Site / Wound Assessment Yellow;Red;Pink 06/08/2015  8:53 AM  % Wound base Red or Granulating 60% 06/08/2015  8:53 AM  % Wound base Yellow 10% 06/08/2015  8:53 AM  % Wound base Black 0% 06/08/2015  8:53 AM  % Wound base Other (Comment) 30% 06/08/2015  8:53 AM  Peri-wound Assessment Intact 06/08/2015  8:53 AM  Wound Length (cm) 10 cm 06/08/2015  8:53 AM  Wound Width (cm) 9 cm 06/08/2015  8:53 AM  Wound Depth (cm) 3 cm 06/08/2015  8:53 AM  Undermining (cm) 3.5-4.0 from 10-2 o'clock 06/08/2015  8:53 AM  Margins Unattached edges (unapproximated) 06/08/2015  8:53 AM  Drainage Amount Moderate 06/08/2015  8:53 AM  Drainage Description Odor;Serosanguineous;Purulent 06/08/2015  8:53 AM  Treatment Debridement (Selective);Hydrotherapy (Pulse lavage);Packing (Saline gauze) 06/08/2015  8:53 AM   Hydrotherapy Pulsed lavage therapy - wound location: sacrum Pulsed Lavage with Suction (psi): 8 psi (4-8 psi) Pulsed Lavage with Suction - Normal Saline Used: 1000 mL Pulsed Lavage Tip: Tip with  splash shield Selective Debridement Selective Debridement - Location: sacrum Selective Debridement - Tools Used: Forceps;Scissors Selective Debridement - Tissue Removed: yellow slough   Wound Assessment and Plan  Wound Therapy - Assess/Plan/Recommendations Wound Therapy - Clinical Statement: Part of base of wound now a dark red instead of beefy red. Wound Therapy - Functional Problem List: Decreased sitting due to pressure sore Factors Delaying/Impairing Wound Healing: Altered sensation;Incontinence;Immobility;Multiple medical problems;Polypharmacy Hydrotherapy Plan: Debridement;Dressing change;Patient/family education;Pulsatile lavage with suction Wound Therapy - Frequency: 6X / week Wound Therapy - Follow Up Recommendations: Other (comment) (LTACH) Wound Plan: See above  Wound Therapy Goals- Improve the function of patient's integumentary system by progressing the wound(s) through the phases of wound healing (inflammation - proliferation - remodeling) by: Decrease Necrotic Tissue to: 10 Decrease Necrotic Tissue - Progress: Goal set today Increase Granulation Tissue to: 90 Increase Granulation Tissue - Progress: Goal set today  Goals will be updated until maximal potential achieved or discharge criteria met.  Discharge criteria: when goals achieved, discharge from hospital, MD decision/surgical intervention, no progress towards goals, refusal/missing three consecutive treatments without notification or medical reason.  GP     [name] 06/08/2015, 12:16 PM [place]

## 2015-06-09 DIAGNOSIS — Z930 Tracheostomy status: Secondary | ICD-10-CM

## 2015-06-09 LAB — BASIC METABOLIC PANEL
Anion gap: 10 (ref 5–15)
BUN: 20 mg/dL (ref 6–20)
CALCIUM: 9.3 mg/dL (ref 8.9–10.3)
CHLORIDE: 98 mmol/L — AB (ref 101–111)
CO2: 31 mmol/L (ref 22–32)
CREATININE: 0.68 mg/dL (ref 0.61–1.24)
GFR calc Af Amer: >60 mL/min (ref >60)
GFR calc non Af Amer: >60 mL/min (ref >60)
Glucose, Bld: 205 mg/dL — ABNORMAL HIGH (ref 65–99)
Potassium: 3.7 mmol/L (ref 3.5–5.1)
Sodium: 139 mmol/L (ref 135–145)

## 2015-06-09 LAB — TROPONIN I: TROPONIN I: 0.03 ng/mL (ref <0.031)

## 2015-06-09 LAB — GLUCOSE, CAPILLARY
GLUCOSE-CAPILLARY: 201 mg/dL — AB (ref 65–99)
GLUCOSE-CAPILLARY: 200 mg/dL — AB (ref 65–99)
GLUCOSE-CAPILLARY: 175 mg/dL — AB (ref 65–99)
Glucose-Capillary: 206 mg/dL — ABNORMAL HIGH (ref 65–99)
Glucose-Capillary: 211 mg/dL — ABNORMAL HIGH (ref 65–99)

## 2015-06-09 LAB — CBC
HEMATOCRIT: 28 % — AB (ref 39.0–52.0)
HEMOGLOBIN: 8.3 g/dL — AB (ref 13.0–17.0)
MCH: 25.2 pg — ABNORMAL LOW (ref 26.0–34.0)
MCHC: 29.6 g/dL — ABNORMAL LOW (ref 30.0–36.0)
MCV: 85.1 fL (ref 78.0–100.0)
Platelets: 388 10*3/uL (ref 150–400)
RBC: 3.29 MIL/uL — ABNORMAL LOW (ref 4.22–5.81)
RDW: 16 % — ABNORMAL HIGH (ref 11.5–15.5)
WBC: 7.2 10*3/uL (ref 4.0–10.5)

## 2015-06-09 NOTE — Progress Notes (Signed)
SBT terminated due to sudden onset of bradycardia.  Full support restored.  RN at bedside.  No further complications noted.  PT appears comfortable and stable.

## 2015-06-09 NOTE — Progress Notes (Signed)
CSW spoke with pt son, [name], concerning plan at time of DC.  CSW informed that MD plan to wean pt to trach collar was successful then pt would have different options for SNF.  Pt son reports that [place] [facility] [place] [facility] [place]  CSW will continue to follow for placement needs   Merlyn LotJenna Holoman, [facility] Clinical Social Worker [phone]

## 2015-06-09 NOTE — Progress Notes (Signed)
Physical Therapy Wound Treatment Patient Details  Name: Anthony Avila MRN: [id number] Date of Birth: [birth year]  Today's Date: 06/09/2015 Time: 4098-1191 Time Calculation (min): 38 min  Subjective  Subjective: Asks how the wound is healing. 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 white drainage along with blood with foul smelling odor, bone palpable (Active)  Dressing Type ABD;Barrier Film (skin prep);Gauze (Comment);Tape dressing;Moist to dry;Other (Comment) 06/09/2015 11:23 AM  Dressing Changed;Clean;Dry;Intact 06/09/2015 11:23 AM  Dressing Change Frequency Daily 06/09/2015 11:23 AM  State of Healing Early/partial granulation 06/09/2015 11:23 AM  Site / Wound Assessment Yellow;Red;Pink 06/09/2015 11:23 AM  % Wound base Red or Granulating 60% 06/09/2015 11:23 AM  % Wound base Yellow 10% 06/09/2015 11:23 AM  % Wound base Black 0% 06/09/2015 11:23 AM  % Wound base Other (Comment) 30% 06/09/2015 11:23 AM  Peri-wound Assessment Intact 06/09/2015 11:23 AM  Wound Length (cm) 10 cm 06/08/2015  8:53 AM  Wound Width (cm) 9 cm 06/08/2015  8:53 AM  Wound Depth (cm) 3 cm 06/08/2015  8:53 AM  Undermining (cm) 3.5-4.0 from 10-2 o'clock 06/08/2015  8:53 AM  Margins Unattached edges (unapproximated) 06/09/2015 11:23 AM  Drainage Amount Moderate 06/09/2015 11:23 AM  Drainage Description Odor;Serosanguineous;Purulent 06/09/2015 11:23 AM  Treatment Debridement (Selective);Hydrotherapy (Pulse lavage);Packing (Saline gauze);Tape changed;Other (Comment) 06/09/2015 11:23 AM  Santyl applied to wound bed prior to applying dressing.  Hydrotherapy Pulsed lavage therapy - wound location: sacrum Pulsed Lavage with Suction (psi): 8 psi (4-8 psi) Pulsed Lavage with Suction - Normal Saline Used: 1000  mL Pulsed Lavage Tip: Tip with splash shield Selective Debridement Selective Debridement - Location: sacrum Selective Debridement - Tools Used: Forceps;Scissors Selective Debridement - Tissue Removed: yellow slough   Wound Assessment and Plan  Wound Therapy - Assess/Plan/Recommendations Wound Therapy - Clinical Statement: Still with area of dark red, questionable granulation. Removed additonal slough as able. Wound Therapy - Functional Problem List: Decreased sitting due to pressure sore Factors Delaying/Impairing Wound Healing: Altered sensation;Incontinence;Immobility;Multiple medical problems;Polypharmacy Hydrotherapy Plan: Debridement;Dressing change;Patient/family education;Pulsatile lavage with suction Wound Therapy - Frequency: 6X / week Wound Therapy - Follow Up Recommendations: Other (comment) (LTACH) Wound Plan: See above  Wound Therapy Goals- Improve the function of patient's integumentary system by progressing the wound(s) through the phases of wound healing (inflammation - proliferation - remodeling) by: Decrease Necrotic Tissue to: 10 Decrease Necrotic Tissue - Progress: Progressing toward goal Increase Granulation Tissue to: 90 Increase Granulation Tissue - Progress: Progressing toward goal  Goals will be updated until maximal potential achieved or discharge criteria met.  Discharge criteria: when goals achieved, discharge from hospital, MD decision/surgical intervention, no progress towards goals, refusal/missing three consecutive treatments without notification or medical reason.  GP     [name] 06/09/2015, 11:28 AM [name], [place]
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
## 2015-06-09 NOTE — Progress Notes (Signed)
eLink Physician-Brief Progress Note Patient Name: Anthony Avila DOB: 11/15/1945 MRN: [id number]   Date of Service  06/09/2015  HPI/Events of Note  Transient bradycardia down into the 30's. This is most likely related to his quadriplegia and unopposed vagal tone. HR now 51. His HR has been in the 50's most of the day.   eICU Interventions  Will order: 1. Cycle Troponins.     Intervention Category Major Interventions: Arrhythmia - evaluation and management  Carnie Bruemmer [name] 06/09/2015, 3:30 PM

## 2015-06-10 LAB — TROPONIN I

## 2015-06-10 LAB — GLUCOSE, CAPILLARY
GLUCOSE-CAPILLARY: 179 mg/dL — AB (ref 65–99)
GLUCOSE-CAPILLARY: 207 mg/dL — AB (ref 65–99)
GLUCOSE-CAPILLARY: 168 mg/dL — AB (ref 65–99)
Glucose-Capillary: 152 mg/dL — ABNORMAL HIGH (ref 65–99)
Glucose-Capillary: 203 mg/dL — ABNORMAL HIGH (ref 65–99)
Glucose-Capillary: 207 mg/dL — ABNORMAL HIGH (ref 65–99)

## 2015-06-10 MED ORDER — ONDANSETRON HCL 4 MG/2ML IJ SOLN
4.0000 mg | Freq: Four times a day (QID) | INTRAMUSCULAR | Status: DC | PRN
  Administered 2015-06-10: 4 mg via INTRAVENOUS
  Filled 2015-06-10: qty 2

## 2015-06-10 MED ORDER — INSULIN GLARGINE 100 UNIT/ML ~~LOC~~ SOLN
15.0000 [IU] | Freq: Every day | SUBCUTANEOUS | Status: DC
  Administered 2015-06-10 – 2015-06-21 (×12): 15 [IU] via SUBCUTANEOUS
  Filled 2015-06-10 (×13): qty 0.15

## 2015-06-10 MED ORDER — FENTANYL 25 MCG/HR TD PT72
75.0000 ug | MEDICATED_PATCH | TRANSDERMAL | Status: DC
  Administered 2015-06-11: 75 ug via TRANSDERMAL
  Filled 2015-06-10: qty 3
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
## 2015-06-10 NOTE — Progress Notes (Signed)
Pt refuses CPT at this time.  Pt states his stomach feels sour- sick.  RN notified.

## 2015-06-10 NOTE — Progress Notes (Signed)
Physical Therapy Wound Treatment Patient Details  Name: Anthony Avila MRN: [id number] Date of Birth: November 27, 1945  Today's Date: 06/10/2015 Time: 0488-8916 Time Calculation (min): 31 min  Subjective  Subjective: Lethargic, mouthing words appropriately. 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 white drainage along with blood with foul smelling odor, bone palpable (Active)  Dressing Type ABD;Barrier Film (skin prep);Gauze (Comment);Tape dressing;Moist to dry;Other (Comment) 06/10/2015  9:00 AM  Dressing Changed;Clean;Dry;Intact 06/10/2015  9:00 AM  Dressing Change Frequency Daily 06/10/2015  9:00 AM  State of Healing Early/partial granulation 06/10/2015  9:00 AM  Site / Wound Assessment Yellow;Red;Pink 06/10/2015  9:00 AM  % Wound base Red or Granulating 60% 06/10/2015  9:00 AM  % Wound base Yellow 10% 06/10/2015  9:00 AM  % Wound base Black 0% 06/10/2015  9:00 AM  % Wound base Other (Comment) 30% 06/10/2015  9:00 AM  Peri-wound Assessment Intact 06/10/2015  9:00 AM  Wound Length (cm) 10 cm 06/08/2015  8:53 AM  Wound Width (cm) 9 cm 06/08/2015  8:53 AM  Wound Depth (cm) 3 cm 06/08/2015  8:53 AM  Undermining (cm) 3.5-4.0 from 10-2 o'clock 06/08/2015  8:53 AM  Margins Unattached edges (unapproximated) 06/10/2015  9:00 AM  Drainage Amount Moderate 06/10/2015  9:00 AM  Drainage Description Odor;Serosanguineous;Purulent 06/10/2015  9:00 AM  Treatment Debridement (Selective);Hydrotherapy (Pulse lavage);Packing (Saline gauze);Tape changed;Other (Comment) 06/10/2015  9:00 AM  Santyl applied to wound bed prior to applying dressing.  Hydrotherapy Pulsed lavage therapy - wound location: sacrum Pulsed Lavage with Suction (psi): 8 psi (4-12 psi) Pulsed Lavage with Suction - Normal Saline Used: 1000 mL Pulsed Lavage  Tip: Tip with splash shield Selective Debridement Selective Debridement - Location: sacrum Selective Debridement - Tools Used: Forceps;Scissors Selective Debridement - Tissue Removed: yellow slough   Wound Assessment and Plan  Wound Therapy - Assess/Plan/Recommendations Wound Therapy - Clinical Statement: Still with small amount of yellow eschar/slough and fair amount of darkened red tissue on left 1/3 of wound from prone position. Continue with current treatment to promote healthy granulation by removal of necrotic tissue. Wound Therapy - Functional Problem List: Decreased sitting due to pressure sore Factors Delaying/Impairing Wound Healing: Altered sensation;Incontinence;Immobility;Multiple medical problems;Polypharmacy Hydrotherapy Plan: Debridement;Dressing change;Patient/family education;Pulsatile lavage with suction Wound Therapy - Frequency: 6X / week Wound Therapy - Follow Up Recommendations: Other (comment) (LTACH) Wound Plan: See above  Wound Therapy Goals- Improve the function of patient's integumentary system by progressing the wound(s) through the phases of wound healing (inflammation - proliferation - remodeling) by: Decrease Necrotic Tissue to: 10 Decrease Necrotic Tissue - Progress: Progressing toward goal Increase Granulation Tissue to: 90 Increase Granulation Tissue - Progress: Progressing toward goal  Goals will be updated until maximal potential achieved or discharge criteria met.  Discharge criteria: when goals achieved, discharge from hospital, MD decision/surgical intervention, no progress towards goals, refusal/missing three consecutive treatments without notification or medical reason.  GP     [name] 06/10/2015, 9:24 AM [name], [name]

## 2015-06-10 NOTE — Progress Notes (Signed)
Inpatient Diabetes Program Recommendations  AACE/ADA: New Consensus Statement on Inpatient Glycemic Control (2015)  Target Ranges:  Prepandial:   less than 140 mg/dL      Peak postprandial:   less than 180 mg/dL (1-2 hours)      Critically ill patients:  140 - 180 mg/dL   Review of Glycemic Control  Diabetes history: DM 2 Outpatient Diabetes medications: Lantus 13 units BID, Novolog 0-20 units TID Current orders for Inpatient glycemic control: Lantus 10 units QHS, Novolog Moderate TID  Inpatient Diabetes Program Recommendations:   Insulin - Meal Coverage: Patient receiving Vital AF 1.2 75 ml/hr. Glucose increased into the 200's. Please consider adding Tube Feed Coverage Novolog 3-5 units Q4hrs.   Thanks,  Random Dobrowski RN, MSN, PCCN Inpatient Diabetes Coordinator Team Pager [phone] (8a-5p)  

## 2015-06-10 NOTE — Progress Notes (Signed)
Pt seen for trach team follow up. No education at this time. All emergency equipment at bedside. Trying to progress pt to ATC full time, but pt having difficulty with. Pt currently weaning on the vent with cpap/ps of 5/7. Will continue to follow up on pt.  Jacqulynn CadetHopper, Zymiere Trostle David RRT

## 2015-06-11 ENCOUNTER — Inpatient Hospital Stay (HOSPITAL_COMMUNITY): Payer: Medicare Other

## 2015-06-11 DIAGNOSIS — B20 Human immunodeficiency virus [HIV] disease: Secondary | ICD-10-CM | POA: Insufficient documentation

## 2015-06-11 DIAGNOSIS — Z515 Encounter for palliative care: Secondary | ICD-10-CM | POA: Insufficient documentation

## 2015-06-11 DIAGNOSIS — D638 Anemia in other chronic diseases classified elsewhere: Secondary | ICD-10-CM | POA: Insufficient documentation

## 2015-06-11 DIAGNOSIS — N390 Urinary tract infection, site not specified: Secondary | ICD-10-CM | POA: Insufficient documentation

## 2015-06-11 DIAGNOSIS — R0600 Dyspnea, unspecified: Secondary | ICD-10-CM

## 2015-06-11 DIAGNOSIS — Z21 Asymptomatic human immunodeficiency virus [HIV] infection status: Secondary | ICD-10-CM | POA: Insufficient documentation

## 2015-06-11 DIAGNOSIS — M542 Cervicalgia: Secondary | ICD-10-CM | POA: Insufficient documentation

## 2015-06-11 DIAGNOSIS — R0603 Acute respiratory distress: Secondary | ICD-10-CM | POA: Insufficient documentation

## 2015-06-11 LAB — GLUCOSE, CAPILLARY
GLUCOSE-CAPILLARY: 159 mg/dL — AB (ref 65–99)
GLUCOSE-CAPILLARY: 189 mg/dL — AB (ref 65–99)
GLUCOSE-CAPILLARY: 209 mg/dL — AB (ref 65–99)
GLUCOSE-CAPILLARY: 179 mg/dL — AB (ref 65–99)
Glucose-Capillary: 166 mg/dL — ABNORMAL HIGH (ref 65–99)
Glucose-Capillary: 194 mg/dL — ABNORMAL HIGH (ref 65–99)

## 2015-06-11 LAB — BLOOD GAS, ARTERIAL
Acid-Base Excess: 6.6 mmol/L — ABNORMAL HIGH (ref 0.0–2.0)
Bicarbonate: 32 mEq/L — ABNORMAL HIGH (ref 20.0–24.0)
Drawn by: 413081
FIO2: 0.4
MODE: POSITIVE
O2 Saturation: 97.9 %
PEEP/CPAP: 5 cmH2O
PRESSURE SUPPORT: 15 cmH2O
Patient temperature: 98.6
TCO2: 33.8 mmol/L (ref 0–100)
pCO2 arterial: 58.7 mmHg (ref 35.0–45.0)
pH, Arterial: 7.355 (ref 7.350–7.450)
pO2, Arterial: 108 mmHg — ABNORMAL HIGH (ref 80.0–100.0)

## 2015-06-11 LAB — CBC
HEMATOCRIT: 28 % — AB (ref 39.0–52.0)
Hemoglobin: 8.4 g/dL — ABNORMAL LOW (ref 13.0–17.0)
MCH: 25.7 pg — AB (ref 26.0–34.0)
MCHC: 30 g/dL (ref 30.0–36.0)
MCV: 85.6 fL (ref 78.0–100.0)
Platelets: 376 10*3/uL (ref 150–400)
RBC: 3.27 MIL/uL — ABNORMAL LOW (ref 4.22–5.81)
RDW: 15.9 % — AB (ref 11.5–15.5)
WBC: 7.1 10*3/uL (ref 4.0–10.5)

## 2015-06-11 LAB — BASIC METABOLIC PANEL
Anion gap: 9 (ref 5–15)
BUN: 26 mg/dL — AB (ref 6–20)
CHLORIDE: 96 mmol/L — AB (ref 101–111)
CO2: 32 mmol/L (ref 22–32)
Calcium: 9 mg/dL (ref 8.9–10.3)
Creatinine, Ser: 0.63 mg/dL (ref 0.61–1.24)
GFR calc Af Amer: >60 mL/min (ref >60)
GFR calc non Af Amer: >60 mL/min (ref >60)
Glucose, Bld: 158 mg/dL — ABNORMAL HIGH (ref 65–99)
POTASSIUM: 4.1 mmol/L (ref 3.5–5.1)
SODIUM: 137 mmol/L (ref 135–145)

## 2015-06-11 LAB — PHOSPHORUS: Phosphorus: 3.1 mg/dL (ref 2.5–4.6)

## 2015-06-11 LAB — MAGNESIUM: Magnesium: 2.1 mg/dL (ref 1.7–2.4)

## 2015-06-11 MED ORDER — SODIUM CHLORIDE 0.9 % IV BOLUS (SEPSIS)
1000.0000 mL | Freq: Once | INTRAVENOUS | Status: AC
  Administered 2015-06-11: 1000 mL via INTRAVENOUS

## 2015-06-11 MED ORDER — QUETIAPINE FUMARATE 50 MG PO TABS
50.0000 mg | ORAL_TABLET | Freq: Once | ORAL | Status: AC
  Administered 2015-06-11: 50 mg via ORAL
  Filled 2015-06-11: qty 1

## 2015-06-11 MED ORDER — METHADONE HCL 5 MG PO TABS
5.0000 mg | ORAL_TABLET | Freq: Two times a day (BID) | ORAL | Status: DC
Start: 2015-06-11 — End: 2015-06-11

## 2015-06-11 MED ORDER — HYDROMORPHONE BOLUS VIA INFUSION
0.5000 mg | INTRAVENOUS | Status: DC | PRN
  Filled 2015-06-11: qty 1

## 2015-06-11 MED ORDER — DULOXETINE HCL 30 MG PO CPEP
30.0000 mg | ORAL_CAPSULE | Freq: Every day | ORAL | Status: DC
  Administered 2015-06-11 – 2015-06-28 (×18): 30 mg via ORAL
  Filled 2015-06-11 (×20): qty 1

## 2015-06-11 MED ORDER — FENTANYL CITRATE (PF) 100 MCG/2ML IJ SOLN
50.0000 ug | Freq: Once | INTRAMUSCULAR | Status: DC
Start: 2015-06-11 — End: 2015-06-11

## 2015-06-11 MED ORDER — ALPRAZOLAM 0.5 MG PO TABS
0.5000 mg | ORAL_TABLET | Freq: Three times a day (TID) | ORAL | Status: DC
  Administered 2015-06-11 (×2): 0.5 mg via ORAL
  Filled 2015-06-11 (×2): qty 1

## 2015-06-11 MED ORDER — SODIUM CHLORIDE 0.9 % IV SOLN
0.5000 mg/h | INTRAVENOUS | Status: DC
  Administered 2015-06-11 – 2015-06-12 (×2): 0.5 mg/h via INTRAVENOUS
  Filled 2015-06-11: qty 5

## 2015-06-11 MED ORDER — MIDAZOLAM HCL 2 MG/2ML IJ SOLN: 2.0000 mg | INTRAMUSCULAR | Status: DC | PRN

## 2015-06-11 MED ORDER — HYDROMORPHONE HCL 1 MG/ML IJ SOLN
1.0000 mg | Freq: Once | INTRAMUSCULAR | Status: AC
  Administered 2015-06-11: 1 mg via INTRAVENOUS
  Filled 2015-06-11: qty 1

## 2015-06-11 NOTE — Progress Notes (Signed)
ABG obtained and relayed to Dr. Arsenio LoaderSommer. Pt bagged with peep valve per verbal order, no change in HR/BP sats remain 100% pt in no distress at this time. Pt remains in CPAP/PS through vent per Dr. Arsenio LoaderSommer. No new orders at this time. Will continue to monitor.

## 2015-06-11 NOTE — Progress Notes (Signed)
Pharmacy Antibiotic Time-Out Note  Anthony Avila is a 69 y.o. year-old male admitted on 05/23/2015.  The patient is currently on Primaxin for Pseudomonas PNA.  Assessment/Plan: After discussion with Dr. Catha GosselinMikhail, the length of therapy for Primaxin for Psuedomonas PNA is 14 days. The stop date is entered and will be 06/15/15.   Recent Labs Lab 06/08/15 0423 06/09/15 0255 06/11/15 0341  WBC 12.2* 7.2 7.1    Recent Labs Lab 06/08/15 0423 06/09/15 0255 06/11/15 0341  CREATININE 0.50* 0.68 0.63   Estimated Creatinine Clearance: 98.1 mL/min (by C-G formula based on Cr of 0.63).   Tmax/24h: Afebrile  Antimicrobial allergies: NKA  Antimicrobials this admission: Vancomycin 10/2 >> 10/5; restart 10/11 >> 10/13 Zosyn 10/2 >> 10/4 Cipro 10/4 >> 10/6 Primaxin 10/11 >>  (10/25)  Levels/dose changes this admission: 10/4 Vancomycin trough 26 mcg/m on 750 mg/8h l >> dose adjusted to 750 mg/12h  Microbiology Results: 10/2 MRSA PCR: negative 10/2 UCx >> PSA (R-cefepime/ceftaz, S-cipro/gent/imi) 10/2 BCx >> 1/2 CoNS 10/11 RCx (TA) >> PSA (R-ceftaz, I-Cefepime, S-cipro/gent/imi/tobra) 10/11 BCx >> NG  Thank you for allowing pharmacy to be a part of this patient's care.  Georgina PillionElizabeth Zacharee Gaddie, PharmD, BCPS Clinical Pharmacist Pager: [phone] 06/11/2015 1:20 PM
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
## 2015-06-11 NOTE — Progress Notes (Signed)
CPT not needed at this time. Pt. Complaining of pain.

## 2015-06-11 NOTE — Progress Notes (Signed)
Pt family member said he is finally resting.  RT notified family member when patient awakes we will try SBT trial again
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
## 2015-06-11 NOTE — Progress Notes (Addendum)
RT unable to do CPT at this time pt unavailable -Pt with Physical Therapy.  Pt states he does not want to do CPT at this time

## 2015-06-11 NOTE — Progress Notes (Signed)
RT called to pts bedside to do PMV on vent.  Pt was on full support PRVC/Vt 620/RR12/Peep 5/40%.  RT and speech at pts bedside. Oral sxn and Trach sxn done. Deflated cuff.  PMV placed on trach inline with vent on full support.  Peep decreased to 0.  RT unable to obtain a Vt to meet the PIP 39 observed prior to initiating PMV due to copious of oral clear thick frothy sputum and pt continuously coughing.  Speech continuously oral sxn pt - thick clear sputum.  Pt anxiety increased and pt demanded pain meds.  He stated the oral clear sputum made him feel he was choking during the PMV trial.  Pt had continuous clear oral secretions.  Pt stated he could not continue -RT and speech removed PMV. Placed pt on cpap/ps 12/5, 40 per MD. Cuff inflated.  Pt states he is ok on SBT trial. Pt in no distress. RN aware pt in pain.

## 2015-06-11 NOTE — Progress Notes (Signed)
Physical Therapy Wound Treatment Patient Details  Name: Anthony Avila MRN: [id number] Date of Birth: 18-Jul-1946  Today's Date: 06/11/2015 Time: 2863-8177 Time Calculation (min): 33 min  Subjective  Subjective: Lethargic, stating that he is having trouble breathing at end of therapy session. RN notified. 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 white drainage along with blood with foul smelling odor, bone palpable (Active)  Dressing Type ABD;Barrier Film (skin prep);Gauze (Comment);Tape dressing;Moist to dry;Other (Comment) 06/11/2015  9:24 AM  Dressing Changed;Clean;Dry;Intact 06/11/2015  9:24 AM  Dressing Change Frequency Daily 06/11/2015  9:24 AM  State of Healing Early/partial granulation 06/11/2015  9:24 AM  Site / Wound Assessment Yellow;Red;Pink 06/11/2015  9:24 AM  % Wound base Red or Granulating 60% 06/11/2015  9:24 AM  % Wound base Yellow 10% 06/11/2015  9:24 AM  % Wound base Black 0% 06/11/2015  9:24 AM  % Wound base Other (Comment) 30% 06/11/2015  9:24 AM  Peri-wound Assessment Intact 06/11/2015  9:24 AM  Wound Length (cm) 10 cm 06/08/2015  8:53 AM  Wound Width (cm) 9 cm 06/08/2015  8:53 AM  Wound Depth (cm) 3 cm 06/08/2015  8:53 AM  Undermining (cm) 3.5-4.0 from 10-2 o'clock 06/08/2015  8:53 AM  Margins Unattached edges (unapproximated) 06/11/2015  9:24 AM  Drainage Amount Moderate 06/11/2015  9:24 AM  Drainage Description Odor;Serosanguineous;Purulent;Other (Comment);Green 06/11/2015  9:24 AM  Treatment Debridement (Selective);Hydrotherapy (Pulse lavage);Cleansed;Packing (Saline gauze);Tape changed;Other (Comment) 06/11/2015  9:24 AM  Santyl applied to wound bed prior to applying dressing.  Hydrotherapy Pulsed lavage therapy - wound location: sacrum Pulsed Lavage with Suction (psi): 8 psi (4-8  psi) Pulsed Lavage with Suction - Normal Saline Used: 1000 mL Pulsed Lavage Tip: Tip with splash shield   Wound Assessment and Plan  Wound Therapy - Assess/Plan/Recommendations Wound Therapy - Clinical Statement: Slow rate of healing at this point. Yellow eschar adherent to wound bed, unable to selectively debride. Santyl applied for enzymatic breakdown. Dark red beginning to look more like a contusion than healthy granulation. Continue to monitor. Wound Therapy - Functional Problem List: Decreased sitting due to pressure sore Factors Delaying/Impairing Wound Healing: Altered sensation;Incontinence;Immobility;Multiple medical problems;Polypharmacy Hydrotherapy Plan: Debridement;Dressing change;Patient/family education;Pulsatile lavage with suction Wound Therapy - Frequency: 6X / week Wound Therapy - Follow Up Recommendations: Other (comment) (LTACH) Wound Plan: See above  Wound Therapy Goals- Improve the function of patient's integumentary system by progressing the wound(s) through the phases of wound healing (inflammation - proliferation - remodeling) by: Decrease Necrotic Tissue to: 10 Decrease Necrotic Tissue - Progress: Progressing toward goal Increase Granulation Tissue to: 90 Increase Granulation Tissue - Progress: Progressing toward goal  Goals will be updated until maximal potential achieved or discharge criteria met.  Discharge criteria: when goals achieved, discharge from hospital, MD decision/surgical intervention, no progress towards goals, refusal/missing three consecutive treatments without notification or medical reason.  GP     [name] 06/11/2015, 9:29 AM [name], [place]

## 2015-06-11 NOTE — Progress Notes (Signed)
D/c Fentanyl patches prior to giving IV Dilaudid witnessed by Sabino GasserNicole RN .

## 2015-06-11 NOTE — Progress Notes (Addendum)
Palliative care here speaking with pt and family regarding pain management - new orders received

## 2015-06-11 NOTE — Progress Notes (Signed)
Pt desated to 70s-placed pt on full support-md aware

## 2015-06-11 NOTE — Progress Notes (Addendum)
eLink Physician-Brief Progress Note Patient Name: Anthony Avila DOB: 12/17/1945 MRN: [id number]   Date of Service  06/11/2015  HPI/Events of Note  Hypotension - BP = 95/52. Bradycardia - HR = 46. ABG on PSV = 7.35/58/108. CXR with L base atelectasis vs pneumonia.   eICU Interventions  Will order: 1. 0.9 NaCl 1 liter IV over 1 hour now. 2. Back off on Dilaudid IV infusion.      Intervention Category Intermediate Interventions: Hypotension - evaluation and management  Sommer,Steven [name] 06/11/2015, 10:37 PM

## 2015-06-11 NOTE — Progress Notes (Signed)
CSW continuing to follow for potential Vent SNF vs trach SNF placement - referral has not been sent to trach SNFs yet due to patient inability to wean at this time.  [facility] will have availability next week for patient if pt is medically stable  CSW will continue to follow.  Merlyn LotJenna Holoman, LCSWA Clinical Social Worker [phone]
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
## 2015-06-11 NOTE — Progress Notes (Signed)
Spoke with Dr.Sommer about patients peak pressures beings in the 50s, pt placed back in CPAP PS to decrease peak pressures at this time. ABG and chest xray ordered. Awaiting results and further orders. Pt resting comfortably at this time, will continue to monitor.
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
## 2015-06-11 NOTE — Progress Notes (Signed)
eLink Physician-Brief Progress Note Patient Name: Anthony Avila DOB: 12/17/1945 MRN: [id number]   Date of Service  06/11/2015  HPI/Events of Note  Called by RT d/t high Ppeak = 52. Pplat = 18. Patient now on PS 15/P 5 with Ppeak = 20.  eICU Interventions  Will order: 1. Check CXR now. 2. Check ABG at 10:30 PM.     Intervention Category Major Interventions: Respiratory failure - evaluation and management  Baruc Tugwell [name] 06/11/2015, 9:26 PM

## 2015-06-12 DIAGNOSIS — M542 Cervicalgia: Secondary | ICD-10-CM | POA: Insufficient documentation

## 2015-06-12 DIAGNOSIS — Z9911 Dependence on respirator [ventilator] status: Secondary | ICD-10-CM | POA: Insufficient documentation

## 2015-06-12 DIAGNOSIS — Z515 Encounter for palliative care: Secondary | ICD-10-CM

## 2015-06-12 LAB — GLUCOSE, CAPILLARY
GLUCOSE-CAPILLARY: 153 mg/dL — AB (ref 65–99)
Glucose-Capillary: 165 mg/dL — ABNORMAL HIGH (ref 65–99)
Glucose-Capillary: 216 mg/dL — ABNORMAL HIGH (ref 65–99)
Glucose-Capillary: 217 mg/dL — ABNORMAL HIGH (ref 65–99)
Glucose-Capillary: 264 mg/dL — ABNORMAL HIGH (ref 65–99)
Glucose-Capillary: 275 mg/dL — ABNORMAL HIGH (ref 65–99)
Glucose-Capillary: 277 mg/dL — ABNORMAL HIGH (ref 65–99)

## 2015-06-12 LAB — BLOOD GAS, ARTERIAL
ACID-BASE EXCESS: 6.6 mmol/L — AB (ref 0.0–2.0)
Bicarbonate: 31.3 mEq/L — ABNORMAL HIGH (ref 20.0–24.0)
DRAWN BY: 44589
FIO2: 0.5
LHR: 20 {breaths}/min
MECHVT: 600 mL
O2 SAT: 88.3 %
PATIENT TEMPERATURE: 98.6
PEEP/CPAP: 5 cmH2O
PH ART: 7.408 (ref 7.350–7.450)
PO2 ART: 55.9 mmHg — AB (ref 80.0–100.0)
TCO2: 32.8 mmol/L (ref 0–100)
pCO2 arterial: 50.6 mmHg — ABNORMAL HIGH (ref 35.0–45.0)

## 2015-06-12 LAB — BASIC METABOLIC PANEL
ANION GAP: 8 (ref 5–15)
BUN: 31 mg/dL — AB (ref 6–20)
CALCIUM: 9.1 mg/dL (ref 8.9–10.3)
CO2: 33 mmol/L — AB (ref 22–32)
CREATININE: 0.73 mg/dL (ref 0.61–1.24)
Chloride: 97 mmol/L — ABNORMAL LOW (ref 101–111)
GFR calc Af Amer: >60 mL/min (ref >60)
GLUCOSE: 275 mg/dL — AB (ref 65–99)
Potassium: 4 mmol/L (ref 3.5–5.1)
Sodium: 138 mmol/L (ref 135–145)

## 2015-06-12 LAB — CBC
HEMATOCRIT: 27.9 % — AB (ref 39.0–52.0)
Hemoglobin: 7.9 g/dL — ABNORMAL LOW (ref 13.0–17.0)
MCH: 24.9 pg — AB (ref 26.0–34.0)
MCHC: 28.3 g/dL — AB (ref 30.0–36.0)
MCV: 88 fL (ref 78.0–100.0)
PLATELETS: 316 10*3/uL (ref 150–400)
RBC: 3.17 MIL/uL — ABNORMAL LOW (ref 4.22–5.81)
RDW: 15.7 % — AB (ref 11.5–15.5)
WBC: 7.4 10*3/uL (ref 4.0–10.5)

## 2015-06-12 MED ORDER — FENTANYL CITRATE (PF) 100 MCG/2ML IJ SOLN
100.0000 ug | INTRAMUSCULAR | Status: DC | PRN
  Administered 2015-06-12 – 2015-06-28 (×61): 100 ug via INTRAVENOUS
  Filled 2015-06-12 (×64): qty 2

## 2015-06-12 MED ORDER — ALPRAZOLAM 0.5 MG PO TABS
0.5000 mg | ORAL_TABLET | Freq: Three times a day (TID) | ORAL | Status: DC | PRN
  Administered 2015-06-12 – 2015-06-28 (×34): 0.5 mg via ORAL
  Filled 2015-06-12 (×36): qty 1

## 2015-06-12 MED ORDER — ALPRAZOLAM 0.25 MG PO TABS
0.2500 mg | ORAL_TABLET | Freq: Three times a day (TID) | ORAL | Status: DC | PRN
  Administered 2015-06-12: 0.25 mg via ORAL
  Filled 2015-06-12: qty 1

## 2015-06-12 MED ORDER — HYDROMORPHONE HCL 1 MG/ML IJ SOLN
0.2500 mg | INTRAMUSCULAR | Status: DC | PRN
  Administered 2015-06-12: 0.25 mg via INTRAVENOUS
  Filled 2015-06-12: qty 1

## 2015-06-12 MED ORDER — FENTANYL 25 MCG/HR TD PT72
75.0000 ug | MEDICATED_PATCH | TRANSDERMAL | Status: DC
  Administered 2015-06-12: 75 ug via TRANSDERMAL
  Filled 2015-06-12: qty 3

## 2015-06-12 MED ORDER — OXYCODONE HCL 5 MG PO TABS
5.0000 mg | ORAL_TABLET | ORAL | Status: DC | PRN
  Administered 2015-06-12: 5 mg via ORAL
  Filled 2015-06-12: qty 1

## 2015-06-12 NOTE — Progress Notes (Signed)
RT called to pts room due to apnea on CPAP/PS and decrease in sats. Pt placed back on PRVC at 50%, PIP in the high 40's. Will continue to monitor.

## 2015-06-12 NOTE — Progress Notes (Signed)
Physical Therapy Wound Treatment Patient Details  Name: Anthony Avila MRN: [id number] Date of Birth: August 28, 1945  Today's Date: 06/12/2015 Time: 11:05- 11:57 am Subjective  Subjective: Pt very anxious about session as his pain medicine regime has been changed. Was able to get pain medicine via peg tube prior to wound care. Pt with increased respiratory breaths during session, improved with RN suctioning, VSS with session Patient and Family Stated Goals: Not stated due to trach/vent Date of Onset:  (prior to admission) Prior Treatments: Dressing change  Pain Score: Pain Score: 9   Wound Assessment     Pressure Ulcer 05/23/15 Stage IV - Full thickness tissue loss with exposed bone, tendon or muscle. white drainage along with blood with foul smelling odor, bone palpable (Active)  Dressing Type ABD;Barrier Film (skin prep);Gauze (Comment);Tape dressing;Moist to dry;Other (Comment) 06/12/2015  1:36 PM  Dressing Changed;Clean;Dry;Intact 06/12/2015  1:36 PM  Dressing Change Frequency Daily 06/12/2015  1:36 PM  State of Healing Early/partial granulation 06/12/2015  1:36 PM  Site / Wound Assessment Yellow;Red;Pink 06/12/2015  1:36 PM  % Wound base Red or Granulating 60% 06/12/2015  1:36 PM  % Wound base Yellow 10% 06/12/2015  1:36 PM  % Wound base Black 0% 06/12/2015  1:36 PM  % Wound base Other (Comment) 30% 06/12/2015  1:36 PM  Peri-wound Assessment Intact 06/12/2015  1:36 PM  Wound Length (cm) 10 cm 06/08/2015  8:53 AM  Wound Width (cm) 9 cm 06/08/2015  8:53 AM  Wound Depth (cm) 3 cm 06/08/2015  8:53 AM  Undermining (cm) 3.5-4.0 from 10-2 o'clock 06/08/2015  8:53 AM  Margins Unattached edges (unapproximated) 06/12/2015  1:36 PM  Drainage Amount Moderate 06/12/2015  1:36 PM  Drainage Description Odor;Serosanguineous;Purulent;Other (Comment);Green 06/12/2015  1:36 PM  Treatment Hydrotherapy (Pulse lavage);Cleansed;Packing (Saline gauze);Tape changed 06/12/2015  1:36 PM   Hydrotherapy Pulsed  lavage therapy - wound location: sacrum Pulsed Lavage with Suction (psi): 8 psi (4-8 psi) Pulsed Lavage with Suction - Normal Saline Used: 1000 mL Pulsed Lavage Tip: Tip with splash shield   Wound Assessment and Plan  Wound Therapy - Assess/Plan/Recommendations Wound Therapy - Clinical Statement: Spoke with RN before seeing pt who okay wound care to continue after events of past 24 hours. Pt very anxious with session. RN in for deep trach suction before pulsed lavage performed. Needed increased time due to incontinent of stool (? leak of flexiseal). Nursing made award and nurse tech in to assist with cleaning pt up and providing reassurance to pt during wound care. Did not perform selective debridement today due to pt's level of anxiety during session to minimize time needed on side and with treatment intervention. Pt reporting increased pain afterwards and asking for more pain medicine, that what he had "is not enough". RN made award. Will continue as able toward goals.                                       Wound Therapy - Functional Problem List: Decreased sitting due to pressure sore Factors Delaying/Impairing Wound Healing: Altered sensation;Incontinence;Immobility;Multiple medical problems;Polypharmacy Hydrotherapy Plan: Debridement;Dressing change;Patient/family education;Pulsatile lavage with suction Wound Therapy - Frequency: 6X / week Wound Therapy - Follow Up Recommendations: Other (comment) (LTACH) Wound Plan: See above  Wound Therapy Goals- Improve the function of patient's integumentary system by progressing the wound(s) through the phases of wound healing (inflammation - proliferation - remodeling) by: Decrease Necrotic Tissue to: 10  Decrease Necrotic Tissue - Progress: Progressing toward goal Increase Granulation Tissue - Progress: Progressing toward goal  Goals will be updated until maximal potential achieved or discharge criteria met.  Discharge criteria: when goals achieved,  discharge from hospital, MD decision/surgical intervention, no progress towards goals, refusal/missing three consecutive treatments without notification or medical reason.   [name] 06/12/2015, 1:44 PM  [name], PTA, CLT Acute Rehab Services [phone] 06/12/15, 1:45 PM
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
## 2015-06-12 NOTE — Progress Notes (Addendum)
Daily Progress Note   Patient Name: Anthony Avila       Date: 06/12/2015 DOB: 1946/06/01  Age: 69 y.o. MRN#: [id number] Attending Physician: Edsel Petrin, DO Primary Care Physician: [name], MD Admit Date: 05/23/2015  Reason for Consultation/Follow-up: Non pain symptom management, Pain control and Psychosocial/spiritual support  Subjective: Pt in crisis in the night: became hypothermic, hypotensive. Bld cx ordered and pending. Dilaudid gtt decreased, and 1 L fluid bolus given, bare hugger placed. Dilaudid gtt decreased from basal rate of 0.5mg  hour to .25 mg/hr from 10 pm to 5 am but pt cont to verbalize pain . Infusion increased back to 0.5mg /hr at 0500.Pt more alert this am, and temp and BP improved. He is speaking to me this am, cont to verbalize pain but approx 0830 he became more somnolent. Dilaudid infusion stopped. Rapid response and CCM NP arrived. Placed back on full support, and pt has become more alert. He now appears to be more at his baseline. He tells me he thinks something has changed in his body. After further conversation I did ask him if he had a sense if he was dying, if things had changed that much. He did not answer me. I asked him if he wanted me to call his son, and he said no. He asked for something for pain as well as anxiety which RN gave him. He asked me to stay with him Interval Events: Fentanyl TD 75 mcg and 100 mcg q2 iv prn dc'd and dilaudid infusion started 0.5mg /hr and 0.5 q 20 min prn. Sch ativan 1 mg q12 dc'd and xanax .5 q8 atc started Length of Stay: 20 days  Current Medications: Scheduled Meds:  . amLODipine  10 mg Per Tube Daily  . antiseptic oral rinse  7 mL Mouth Rinse QID  . ascorbic acid  500 mg Per Tube Daily  . baclofen  10 mg Per Tube Q6H  . chlorhexidine gluconate  15 mL Mouth Rinse BID  . cloNIDine  0.1 mg Per Tube TID  . collagenase   Topical Daily  . dolutegravir  50 mg Oral Daily  . DULoxetine  30 mg Oral Daily  .  emtricitabine-tenofovir AF  1 tablet Oral Daily  . heparin subcutaneous  5,000 Units Subcutaneous 3 times per day  . imipenem-cilastatin  500 mg Intravenous Q6H  . insulin aspart  0-15 Units Subcutaneous 6 times per day  . insulin glargine  15 Units Subcutaneous QHS  . multivitamin  5 mL Per Tube Daily  . pantoprazole sodium  40 mg Per Tube Q24H  . polyethylene glycol  17 g Oral Daily  . propantheline  15 mg Oral TID WC & HS  . QUEtiapine  25 mg Oral QHS  . sennosides  10 mL Per Tube BID    Continuous Infusions: . sodium chloride 10 mL/hr (06/11/15 1316)  . feeding supplement (VITAL AF 1.2 CAL) 1,000 mL (06/11/15 2216)    PRN Meds: acetaminophen, albuterol, ALPRAZolam, bisacodyl, hydrALAZINE, HYDROmorphone (DILAUDID) injection, iohexol, ondansetron (ZOFRAN) IV, oxyCODONE, phenol, zolpidem  Palliative Performance Scale: 30%     Vital Signs: BP 160/64 mmHg  Pulse 84  Temp(Src) 97.8 F (36.6 C) (Axillary)  Resp 15  Ht  (1.88 m)  Wt 82.555 kg (182 lb)  BMI 23.36 kg/m2  SpO2 93% SpO2: SpO2: 93 % O2 Device: O2 Device: Ventilator O2 Flow Rate:    Intake/output summary:  Intake/Output Summary (Last 24 hours) at 06/12/15 1004 Last data filed at 06/12/15 7829  Gross per 24 hour  Intake 2216.96 ml  Output   1275 ml  Net 941.96 ml   LBM:   Baseline Weight: Weight: 76.658 kg (169 lb) Most recent weight: Weight: 82.555 kg (182 lb)  Physical Exam: Gen: Older man acutely ill Resp: Full vent support, trach Neuro: Alert now, talking              Additional Data Reviewed: Recent Labs     06/11/15  0341  06/12/15  0128  WBC  7.1  7.4  HGB  8.4*  7.9*  PLT  376  316  NA  137  138  BUN  26*  31*  CREATININE  0.63  0.73     Problem List:  Patient Active Problem List   Diagnosis Date Noted  . Respiratory distress   . UTI (lower urinary tract infection)   . Anemia of chronic disease   . HIV (human immunodeficiency virus infection) (HCC)   . Palliative care  encounter   . Acute neck pain   . 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 Atelectasis   . Respiratory failure (HCC)   . Apnea   . Arm swelling   . Hypoxia   . Leaking PEG tube (HCC)   . Pulmonary edema   . HCAP (healthcare-associated pneumonia) 05/23/2015  . Pressure ulcer 05/23/2015     Palliative Care Assessment & Plan    Code Status:  Full code  Goals of Care:  Full Code  Symptom Management:  Pain: Pt needs aggressive pain mgt and likely high doses by what he has been verbalizing but is too critically ill, full code, to pursue this. Will dc consult to Dr. Ollen BowlHarkins for interventional pain consult because of underlying medical instability. Staffed with Dr. Linna DarnerAnwar. Will place PRN orders only at lower doses as follows: dilaudid .25mg  q4 prn and oxycodone 5 mg q4 prn. Based on past history feel this is inadequate but higher doses placing pt at risk with underlying medical issues  Anxiety: Still very anxious. Will cont xanax but at .25 q4 prn only  Agitation/delirium: Cont Seroquel 25 mg qhs.  Will monitor for improvement and thus ability to further titrate all rx for improved symptom mgt  Palliative Prophylaxis:  Rectal tube in place  Psycho-social/Spiritual:  Desire for further Chaplaincy support:no   Prognosis: Unable to determine Discharge Planning: SNF vs LTAC   Care plan was discussed with Dr's Catha GosselinMikhail, Dr. Linna DarnerAnwar, CCM B. Corky Downslsen, NP. I am very concerned for Anthony Avila given his unmanaged pain and anxiety as well as his underlying medical fragility. I am concerned that despite aggressive measures, full support, abx, vent,  that Anthony Avila maybe nearing EOL. Will continue to speak with him and hopefully family  Thank you for allowing the Palliative Medicine Team to assist in the care of this patient.   Time In: 0800 Time Out: 1000 Total Time 120 min Prolonged Time Billed  yes     Greater than 50%  of this time was spent counseling and coordinating care related to  the above assessment and plan.   Irean HongSarah Grace Rodell Marrs, NP  06/12/2015, 10:04 AM  Please contact Palliative Medicine Team phone at [phone] for questions and concerns.    AddendumMicah Flesher: Went back to check on pt and he is requesting to resume pain medication as well as versed that he has been on. Staffed with supervising MD, Dr. Linna DarnerAnwar, and will restart fentanyl TD 75 mcg q72 and 100 mcg iv q2 prn. Will DC  oxycodone, dilaudid . Will increase xanax .5 Q8 prn, but will not resume versed. Will defer this to CCM. Mr. Mehringer presents as someone needing more aggressive pain mgt in order to verbalize comfort but due to underlying disease processes, GOC, this is very difficult to provide without destabilizing him.

## 2015-06-12 NOTE — Significant Event (Signed)
Rapid Response Event Note  Overview: Time Called: 0831 Arrival Time: 0837 Event Type: Neurologic, Respiratory  Initial Focused Assessment:  Called to see patient for lethargy and "not breathing well".  Upon my arrival to patients room, RN and Palliative Care NP at bedside.  Patient is very lethargic had been on dilaudid drip which is now turned off.  Patient had been on Pressure support, RT switched patient back to full support. Skin is warm and dry   Interventions:  Patient has baer hugger on, temp taken 97.8.   VSS  160/64, HR 84, RR 15, 94%.  Merry ProudBrandi, NP at bedside.  Assisted with repositioning and dressing change.  Lights put on.  Patient more alert and communicating with us.     Event Summary:  RN to call if assistance needed   at      at          [facility], Maryagnes Amosenise Ann

## 2015-06-12 NOTE — Progress Notes (Signed)
Pt refuses CPT. Pt complaining of pain. Wants pain meds first.

## 2015-06-12 NOTE — Progress Notes (Signed)
eLink Physician-Brief Progress Note Patient Name: Anthony Avila DOB: 09/01/1945 MRN: [id number]   Date of Service  06/12/2015  HPI/Events of Note  Hypothermia - Temp = 94.2 F.  eICU Interventions  Will order: 1. [employer] Hugger. 2. Blood Cultures X 2.      Intervention Category Intermediate Interventions: Other:  Sommer,Steven Dennard Nipugene 06/12/2015, 1:20 AM
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
## 2015-06-12 NOTE — Progress Notes (Signed)
Patient unresponsive. Called Rapid Response and Respiratory to assess. Per discussion with Eduard RouxSarah Bullard, NP in charge of palliative care, patient's dilaudid drip was discontinued. Patient became alert again and his pain care regimen was changed to Dilaudid PRN. Noe GensStefanie A Shereta Crothers, RN

## 2015-06-13 ENCOUNTER — Inpatient Hospital Stay (HOSPITAL_COMMUNITY): Payer: Medicare Other

## 2015-06-13 LAB — GLUCOSE, CAPILLARY
GLUCOSE-CAPILLARY: 204 mg/dL — AB (ref 65–99)
Glucose-Capillary: 240 mg/dL — ABNORMAL HIGH (ref 65–99)
Glucose-Capillary: 207 mg/dL — ABNORMAL HIGH (ref 65–99)
Glucose-Capillary: 199 mg/dL — ABNORMAL HIGH (ref 65–99)
Glucose-Capillary: 237 mg/dL — ABNORMAL HIGH (ref 65–99)

## 2015-06-13 LAB — BASIC METABOLIC PANEL
Anion gap: 9 (ref 5–15)
BUN: 37 mg/dL — AB (ref 6–20)
CHLORIDE: 97 mmol/L — AB (ref 101–111)
CO2: 32 mmol/L (ref 22–32)
CREATININE: 0.67 mg/dL (ref 0.61–1.24)
Calcium: 9.2 mg/dL (ref 8.9–10.3)
GFR calc Af Amer: >60 mL/min (ref >60)
GLUCOSE: 254 mg/dL — AB (ref 65–99)
Potassium: 4.4 mmol/L (ref 3.5–5.1)
SODIUM: 138 mmol/L (ref 135–145)

## 2015-06-13 LAB — CBC
HCT: 27.5 % — ABNORMAL LOW (ref 39.0–52.0)
Hemoglobin: 7.9 g/dL — ABNORMAL LOW (ref 13.0–17.0)
MCH: 25.5 pg — ABNORMAL LOW (ref 26.0–34.0)
MCHC: 28.7 g/dL — AB (ref 30.0–36.0)
MCV: 88.7 fL (ref 78.0–100.0)
PLATELETS: 283 10*3/uL (ref 150–400)
RBC: 3.1 MIL/uL — ABNORMAL LOW (ref 4.22–5.81)
RDW: 15.6 % — AB (ref 11.5–15.5)
WBC: 11.3 10*3/uL — AB (ref 4.0–10.5)

## 2015-06-13 MED ORDER — FENTANYL 25 MCG/HR TD PT72
100.0000 ug | MEDICATED_PATCH | TRANSDERMAL | Status: DC
  Administered 2015-06-13 – 2015-06-28 (×6): 100 ug via TRANSDERMAL
  Filled 2015-06-13 (×6): qty 4

## 2015-06-13 NOTE — Progress Notes (Signed)
Pt refusing cpt at this time. 
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
## 2015-06-14 ENCOUNTER — Inpatient Hospital Stay (HOSPITAL_COMMUNITY): Payer: Medicare Other

## 2015-06-14 DIAGNOSIS — J9600 Acute respiratory failure, unspecified whether with hypoxia or hypercapnia: Secondary | ICD-10-CM | POA: Insufficient documentation

## 2015-06-14 LAB — CBC
HCT: 28.6 % — ABNORMAL LOW (ref 39.0–52.0)
HEMOGLOBIN: 8 g/dL — AB (ref 13.0–17.0)
MCH: 25.2 pg — ABNORMAL LOW (ref 26.0–34.0)
MCHC: 28 g/dL — ABNORMAL LOW (ref 30.0–36.0)
MCV: 89.9 fL (ref 78.0–100.0)
PLATELETS: 279 10*3/uL (ref 150–400)
RBC: 3.18 MIL/uL — AB (ref 4.22–5.81)
RDW: 15.7 % — ABNORMAL HIGH (ref 11.5–15.5)
WBC: 6.6 10*3/uL (ref 4.0–10.5)

## 2015-06-14 LAB — GLUCOSE, CAPILLARY
GLUCOSE-CAPILLARY: 224 mg/dL — AB (ref 65–99)
GLUCOSE-CAPILLARY: 213 mg/dL — AB (ref 65–99)
GLUCOSE-CAPILLARY: 225 mg/dL — AB (ref 65–99)
Glucose-Capillary: 215 mg/dL — ABNORMAL HIGH (ref 65–99)
Glucose-Capillary: 204 mg/dL — ABNORMAL HIGH (ref 65–99)
Glucose-Capillary: 216 mg/dL — ABNORMAL HIGH (ref 65–99)

## 2015-06-14 LAB — BASIC METABOLIC PANEL
ANION GAP: 6 (ref 5–15)
BUN: 29 mg/dL — ABNORMAL HIGH (ref 6–20)
CHLORIDE: 100 mmol/L — AB (ref 101–111)
CO2: 36 mmol/L — ABNORMAL HIGH (ref 22–32)
Calcium: 9.3 mg/dL (ref 8.9–10.3)
Creatinine, Ser: 0.54 mg/dL — ABNORMAL LOW (ref 0.61–1.24)
Glucose, Bld: 187 mg/dL — ABNORMAL HIGH (ref 65–99)
POTASSIUM: 4.3 mmol/L (ref 3.5–5.1)
SODIUM: 142 mmol/L (ref 135–145)

## 2015-06-14 MED ORDER — ACETYLCYSTEINE 20 % IN SOLN
4.0000 mL | Freq: Two times a day (BID) | RESPIRATORY_TRACT | Status: AC
  Administered 2015-06-14 – 2015-06-15 (×3): 4 mL via RESPIRATORY_TRACT
  Filled 2015-06-14 (×4): qty 4

## 2015-06-14 NOTE — Progress Notes (Signed)
Pt continuing to refuse CPT at this time
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
## 2015-06-14 NOTE — Progress Notes (Addendum)
CSW spoke with [facility]- they will have bed available tomorrow (10/25) if pt is medically stable  CSW will continue to follow.  Merlyn LotJenna Holoman, LCSWA Clinical Social Worker [phone]

## 2015-06-14 NOTE — Progress Notes (Signed)
Inpatient Diabetes Program Recommendations  AACE/ADA: New Consensus Statement on Inpatient Glycemic Control (2015)  Target Ranges:  Prepandial:   less than 140 mg/dL      Peak postprandial:   less than 180 mg/dL (1-2 hours)      Critically ill patients:  140 - 180 mg/dL   Review of Glycemic Control  Diabetes history: DM 2 Outpatient Diabetes medications: Lantus 13 units BID, Novolog 0-20 units TID Current orders for Inpatient glycemic control: Lantus 10 units QHS, Novolog Moderate TID  Inpatient Diabetes Program Recommendations:   Insulin - Meal Coverage: Patient receiving Vital AF 1.2 75 ml/hr. Glucose increased into the 200's. Please consider adding Tube Feed Coverage Novolog 3-5 units Q4hrs.   Thanks,  Christena DeemShannon Brodie Correll RN, MSN, [facility] Inpatient Diabetes Coordinator Team Pager [phone] (8a-5p)

## 2015-06-14 NOTE — Progress Notes (Signed)
Daily Progress Note   Patient Name: Anthony Avila       Date: 06/14/2015 DOB: 12/05/1945  Age: 69 y.o. MRN#: [id number] Attending Physician: Edsel PetrinMaryann Mikhail, DO Primary Care Physician: [name], [name], MD Admit Date: 05/23/2015  Reason for Consultation/Follow-up: Establishing goals of care, Non pain symptom management and Pain control  Subjective: Continues to verbalize no improvement in pain. Multiple prn iv fetnayl 100 mcg injections noted Interval Event No improvement in pain or anxiety, but did not decompensate overnight. Remains on full vent support. WBC still WNL but elevated slightly Length of Stay: 22 days  Current Medications: Scheduled Meds:  . amLODipine  10 mg Per Tube Daily  . antiseptic oral rinse  7 mL Mouth Rinse QID  . ascorbic acid  500 mg Per Tube Daily  . baclofen  10 mg Per Tube Q6H  . chlorhexidine gluconate  15 mL Mouth Rinse BID  . cloNIDine  0.1 mg Per Tube TID  . collagenase   Topical Daily  . dolutegravir  50 mg Oral Daily  . DULoxetine  30 mg Oral Daily  . emtricitabine-tenofovir AF  1 tablet Oral Daily  . fentaNYL  100 mcg Transdermal Q72H  . heparin subcutaneous  5,000 Units Subcutaneous 3 times per day  . imipenem-cilastatin  500 mg Intravenous Q6H  . insulin aspart  0-15 Units Subcutaneous 6 times per day  . insulin glargine  15 Units Subcutaneous QHS  . multivitamin  5 mL Per Tube Daily  . pantoprazole sodium  40 mg Per Tube Q24H  . polyethylene glycol  17 g Oral Daily  . propantheline  15 mg Oral TID WC & HS  . QUEtiapine  25 mg Oral QHS  . sennosides  10 mL Per Tube BID    Continuous Infusions: . sodium chloride 10 mL/hr (06/11/15 1316)  . feeding supplement (VITAL AF 1.2 CAL) 1,000 mL (06/14/15 0050)    PRN Meds: acetaminophen,  albuterol, ALPRAZolam, bisacodyl, fentaNYL (SUBLIMAZE) injection, hydrALAZINE, iohexol, ondansetron (ZOFRAN) IV, phenol, zolpidem  Physical Exam: Physical Exam  Constitutional: He is oriented to person, place, and time. He appears well-nourished.  HENT:  Head: Normocephalic.  Neck:  Trach collar  Cardiovascular:  tachy  Pulmonary/Chest:  Trach, vent dependant  Abdominal: He exhibits distension.  Genitourinary:  Foley and rectal tube  Musculoskeletal:  quadraplegic  Neurological: He is alert and oriented to person, place, and time.  Skin:  Stage 4 sacral wound  Psychiatric:  Very anxious looking, distressed                Vital Signs: BP 110/62 mmHg  Pulse 48  Temp(Src) 97.8 F (36.6 C) (Oral)  Resp 20  Ht  (1.88 m)  Wt 85.73 kg (189 lb)  BMI 24.26 kg/m2  SpO2 100% SpO2: SpO2: 100 % O2 Device: O2 Device: Ventilator O2 Flow Rate:    Intake/output summary:  Intake/Output Summary (Last 24 hours) at 06/14/15 1045 Last data filed at 06/14/15 1610  Gross per 24 hour  Intake 1999.42 ml  Output   1150 ml  Net 849.42 ml   LBM:   Baseline Weight: Weight: 76.658 kg (169 lb) Most recent weight: Weight: 85.73 kg (189 lb)       Palliative Assessment/Data: Flowsheet Rows        Most Recent Value   Intake Tab    Referral Department  Hospitalist   Unit at Time of Referral  Intermediate Care Unit   Palliative Care Primary Diagnosis  Sepsis/Infectious Disease   Date Notified  06/11/15   Palliative Care Type  New Palliative care   Reason for referral  Pain, Non-pain Symptom   Date of Admission  05/23/15   Date first seen by Palliative Care  06/11/15   # of days Palliative referral response time  0 Day(s)   # of days IP prior to Palliative referral  19   Clinical Assessment    Palliative Performance Scale Score  20%   Pain Max last 24 hours  9   Pain Min Last 24 hours  9   Dyspnea Max Last 24 Hours  Other (Comment)   Dyspnea Min Last 24 hours  Other (Comment)    Nausea Max Last 24 Hours  0   Nausea Min Last 24 Hours  0   Anxiety Max Last 24 Hours  9   Anxiety Min Last 24 Hours  9   Psychosocial & Spiritual Assessment    Social Work Plan of Care  Grief and Bereavement support   Palliative Care Outcomes    Patient/Family meeting held?  No   Palliative Care Outcomes  Provided psychosocial or spiritual support   Patient/Family wishes: Interventions discontinued/not started   Other (Comment)   Palliative Care follow-up planned  Yes, Facility      Additional Data Reviewed: Recent Labs     06/13/15  0555  06/14/15  0313  WBC  11.3*  6.6  HGB  7.9*  8.0*  PLT  283  279  NA  138  142  BUN  37*  29*  CREATININE  0.67  0.54*     Problem List:  Patient Active Problem List   Diagnosis Date Noted  . Neck pain   . Ventilator dependence (HCC)   . Respiratory distress   . UTI (lower urinary tract infection)   . Anemia of chronic disease   . HIV (human immunodeficiency virus infection) (HCC)   . Palliative care encounter   . Acute neck pain   . 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 Atelectasis   . Respiratory failure (HCC)   . Apnea   . Arm swelling   . Hypoxia   . Leaking PEG tube (HCC)   . Pulmonary edema   . HCAP (healthcare-associated pneumonia) 05/23/2015  . Pressure ulcer 05/23/2015     Palliative Care Assessment &  Plan    1.Code Status:  Full code    Code Status Orders        Start     Ordered   05/23/15 1558  Full code   Continuous     05/23/15 1557    Advance Directive Documentation        Most Recent Value   Type of Advance Directive  Healthcare Power of Attorney   Pre-existing out of facility DNR order (yellow form or pink MOST form)     "MOST" Form in Place?         2. Goals of Care:  Continue all full aggressive support, remains Full Code  Limitations on Scope of Treatment: family unable to articulate any limitations despite poor prognosis. This is per CCM and RN discussions.   Desire for  further Chaplaincy support:no  Psycho-social Needs: Pt needs tremendous support. He has a Total Pain clinical presentation in terms of unmet physical, psychosocial as well as spiritual needs  3. Symptom Management:      1.Pain: Will increase Fentanyl TD 100 mcg q 72 hours and continue IV fentanyl 100 mcg q 72 hours        2.Anxiety: Uncontrolled. Cont PRN xanax. Would defer resuming versed to CCM  4. Palliative Prophylaxis:   Bowel Regimen. Has rectal tube in  5. Prognosis: Concern that pt only has weeks to live 2/2 sepsis despite all aggressive measures in place  6. Discharge Planning:  Still pending. Family does not want pt to return to [facility] per chart review   Care plan was discussed with Dr. Linna Darner and Dr. Catha Gosselin  Thank you for allowing the Palliative Medicine Team to assist in the care of this patient.   Time In: 0900 Time Out: 0925 Total Time 25 Prolonged Time Billed  no         Irean Hong, NP  06/14/2015, 10:45 AM  Please contact Palliative Medicine Team phone at [phone] for questions and concerns.

## 2015-06-14 NOTE — Progress Notes (Signed)
Daily Progress Note   Patient Name: Anthony Avila       Date: 06/14/2015 DOB: 08-31-1945  Age: 69 y.o. MRN#: [id number] Attending Physician: Edsel Petrin, DO Primary Care Physician: [name], MD Admit Date: 05/23/2015  Reason for Consultation/Follow-up: Establishing goals of care, Non pain symptom management and Pain control  Subjective:     Mr. Torregrossa is lying in bed and will smile when talking with him. He tells me that his pain is tolerable with the prn medication although he needs this frequently. Pain is in head and neck. He continues to be very anxious. We discussed that unfortunately any efforts to better manage his pain his body has not tolerated very well - he seems to understand this. When I try to address the difficult circumstances he is in to further GOC conversation he tells me that it hurts his throat to try and talk and wishes to sleep. Very difficult situation but at this time we will continue to respect his wishes for full aggressive care. Bronch tentative planned for tomorrow. No family has been involved in any palliative conversations thus far.    Length of Stay: 22 days  Current Medications: Scheduled Meds:  . acetylcysteine  4 mL Nebulization BID  . amLODipine  10 mg Per Tube Daily  . antiseptic oral rinse  7 mL Mouth Rinse QID  . ascorbic acid  500 mg Per Tube Daily  . baclofen  10 mg Per Tube Q6H  . chlorhexidine gluconate  15 mL Mouth Rinse BID  . cloNIDine  0.1 mg Per Tube TID  . collagenase   Topical Daily  . dolutegravir  50 mg Oral Daily  . DULoxetine  30 mg Oral Daily  . emtricitabine-tenofovir AF  1 tablet Oral Daily  . fentaNYL  100 mcg Transdermal Q72H  . heparin subcutaneous  5,000 Units Subcutaneous 3 times per day  . imipenem-cilastatin  500 mg Intravenous Q6H  . insulin aspart  0-15 Units Subcutaneous 6 times per day  . insulin glargine  15 Units Subcutaneous QHS  . multivitamin  5 mL Per Tube Daily  . pantoprazole sodium  40 mg Per Tube  Q24H  . polyethylene glycol  17 g Oral Daily  . propantheline  15 mg Oral TID WC & HS  . QUEtiapine  25 mg Oral QHS  . sennosides  10 mL Per Tube BID    Continuous Infusions: . sodium chloride 10 mL/hr (06/11/15 1316)  . feeding supplement (VITAL AF 1.2 CAL) 1,000 mL (06/14/15 0050)    PRN Meds: acetaminophen, albuterol, ALPRAZolam, bisacodyl, fentaNYL (SUBLIMAZE) injection, hydrALAZINE, iohexol, ondansetron (ZOFRAN) IV, phenol, zolpidem  Palliative Performance Scale: 20%     Vital Signs: BP 110/62 mmHg  Pulse 51  Temp(Src) 97.8 F (36.6 C) (Oral)  Resp 17  Ht  (1.88 m)  Wt 85.73 kg (189 lb)  BMI 24.26 kg/m2  SpO2 100% SpO2: SpO2: 100 % O2 Device: O2 Device: Ventilator O2 Flow Rate:    Intake/output summary:  Intake/Output Summary (Last 24 hours) at 06/14/15 1801 Last data filed at 06/14/15 1038  Gross per 24 hour  Intake 1359.42 ml  Output   1150 ml  Net 209.42 ml   LBM: Last BM Date: 06/14/15 Baseline Weight: Weight: 76.658 kg (169 lb) Most recent weight: Weight: 85.73 kg (189 lb)  Physical Exam: General: Lying in bed HEENT: Trach to vent CVS: Tachy Resp: Vent, no tachypnea Abd: Soft, ND Neuro: Awake, alert, oriented x3    Additional  Data Reviewed: Recent Labs     06/13/15  0555  06/14/15  0313  WBC  11.3*  6.6  HGB  7.9*  8.0*  PLT  283  279  NA  138  142  BUN  37*  29*  CREATININE  0.67  0.54*     Problem List:  Patient Active Problem List   Diagnosis Date Noted  . Acute respiratory failure (HCC)   . Neck pain   . Ventilator dependence (HCC)   . Respiratory distress   . UTI (lower urinary tract infection)   . Anemia of chronic disease   . HIV (human immunodeficiency virus infection) (HCC)   . Palliative care encounter   . Acute neck pain   . 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 Atelectasis   . Respiratory failure (HCC)   . Apnea   . Arm swelling   . Hypoxia   . Leaking PEG tube (HCC)   . Pulmonary edema   . HCAP  (healthcare-associated pneumonia) 05/23/2015  . Pressure ulcer 05/23/2015     Palliative Care Assessment & Plan    Code Status:  Full code  Goals of Care:  Full aggressive care. Unclear what goals he is hoping to work towards at this time.   Desire for further Chaplaincy support:no  3. Symptom Management:  Pain: Continue fentanyl 100 mcg every 2 hours prn. Fentanyl patch 100 mcg every 72 hrs.   Anxiety: Continue xanax prn.   4. Palliative Prophylaxis:  Turn/reposition, bowel regimen, aspiration precautions  5. Prognosis: Poor prognosis with likelihood of recurrent/continuing infection and recurrent hospitalizations.   5. Discharge Planning: Will need vent SNF although they have not been happy with [facility]. Unfortunately options are very few for the amount of care he will require.   Thank you for allowing the Palliative Medicine Team to assist in the care of this patient.   Time In: 1405 Time Out: 1425 Total Time 20min Prolonged Time Billed  no     Greater than 50%  of this time was spent counseling and coordinating care related to the above assessment and plan.     Yong ChannelAlicia Schawn Byas, NP Palliative Medicine Team Pager # [phone] (M-F 8a-5p) Team Phone # [phone] (Nights/Weekends)  06/14/2015, 6:01 PM
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
## 2015-06-14 NOTE — Progress Notes (Signed)
Physical Therapy Wound Treatment Patient Details  Name: Anthony Avila MRN: [id number] Date of Birth: [birth year]  Today's Date: 06/14/2015 Time: 1444-5848 Time Calculation (min): 18 min  Subjective  Subjective: Pt lethargic but requests pain medication. Has just been given medication by RN prior to our arrival. Patient and Family Stated Goals: Not stated due to trach/vent Date of Onset:  (prior to admission) Prior Treatments: Dressing change  Pain Score: Verbalizes that he wants more pain medication. RN administered pain medication just prior to PT entering room.  Wound Assessment  Unable to assess due to rapid decline in vitals when patient was turned onto his side for hydrotherapy treatment. Room set up and diverter tip needed to be disposed due to cancellation of treatment.     Wound Assessment and Plan  Wound Therapy - Assess/Plan/Recommendations Wound Therapy - Clinical Statement: Patient was placed in sidelying position and wound undressed. Diverter tip applied however patient's HR dropped to mid 20s, sats down to 50%. Pt immediately returned to supine and HOB elevated, hydrotherapy terminated. RN to room with respiratory therapist. Pt verbalizing "I can't breath." Vitals slowly improved. RN and RT now in room. RN notified that wound is still undressed and needs to be packed with NS moist gauze and ABD when pt stable enough to return to sidelying position. Wound Therapy - Functional Problem List: Decreased sitting due to pressure sore Factors Delaying/Impairing Wound Healing: Altered sensation;Incontinence;Immobility;Multiple medical problems;Polypharmacy Hydrotherapy Plan: Debridement;Dressing change;Patient/family education;Pulsatile lavage with suction Wound Therapy - Frequency: 6X / week Wound Therapy - Follow Up Recommendations: Other (comment) Wound Plan: See above  Wound Therapy Goals- Improve the function of patient's integumentary system by progressing the wound(s) through  the phases of wound healing (inflammation - proliferation - remodeling) by: Decrease Necrotic Tissue - Progress: Not progressing Increase Granulation Tissue - Progress: Mot progressing  Goals will be updated until maximal potential achieved or discharge criteria met.  Discharge criteria: when goals achieved, discharge from hospital, MD decision/surgical intervention, no progress towards goals, refusal/missing three consecutive treatments without notification or medical reason.  GP     Ellouise Newer 06/14/2015, 9:28 AM [name], [place]
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
## 2015-06-14 NOTE — Progress Notes (Signed)
SLP Cancellation Note  Patient Details Name: Anthony Avila MRN: [id number] DOB: 05/14/1946   Cancelled treatment:       Reason Eval/Treat Not Completed: Medical issues which prohibited therapy. Discussed pt with Dr. Tyson AliasFeinstein, pt not appropriate for in-line PMSV at this time. Will d/c orders and follow via trach team only.    Anthony Avila, [name] 06/14/2015, 11:03 AM

## 2015-06-15 ENCOUNTER — Inpatient Hospital Stay (HOSPITAL_COMMUNITY): Payer: Medicare Other

## 2015-06-15 DIAGNOSIS — F17211 Nicotine dependence, cigarettes, in remission: Secondary | ICD-10-CM

## 2015-06-15 DIAGNOSIS — A498 Other bacterial infections of unspecified site: Secondary | ICD-10-CM | POA: Insufficient documentation

## 2015-06-15 DIAGNOSIS — J9819 Other pulmonary collapse: Secondary | ICD-10-CM | POA: Insufficient documentation

## 2015-06-15 DIAGNOSIS — Z1624 Resistance to multiple antibiotics: Secondary | ICD-10-CM

## 2015-06-15 DIAGNOSIS — J90 Pleural effusion, not elsewhere classified: Secondary | ICD-10-CM

## 2015-06-15 LAB — GLUCOSE, CAPILLARY
GLUCOSE-CAPILLARY: 170 mg/dL — AB (ref 65–99)
GLUCOSE-CAPILLARY: 158 mg/dL — AB (ref 65–99)
Glucose-Capillary: 165 mg/dL — ABNORMAL HIGH (ref 65–99)
Glucose-Capillary: 173 mg/dL — ABNORMAL HIGH (ref 65–99)
Glucose-Capillary: 217 mg/dL — ABNORMAL HIGH (ref 65–99)
Glucose-Capillary: 267 mg/dL — ABNORMAL HIGH (ref 65–99)
Glucose-Capillary: 274 mg/dL — ABNORMAL HIGH (ref 65–99)

## 2015-06-15 LAB — BASIC METABOLIC PANEL
ANION GAP: 6 (ref 5–15)
BUN: 28 mg/dL — ABNORMAL HIGH (ref 6–20)
CALCIUM: 9.4 mg/dL (ref 8.9–10.3)
CO2: 37 mmol/L — AB (ref 22–32)
CREATININE: 0.51 mg/dL — AB (ref 0.61–1.24)
Chloride: 99 mmol/L — ABNORMAL LOW (ref 101–111)
GLUCOSE: 268 mg/dL — AB (ref 65–99)
Potassium: 4.6 mmol/L (ref 3.5–5.1)
SODIUM: 142 mmol/L (ref 135–145)

## 2015-06-15 LAB — CBC
HCT: 28.8 % — ABNORMAL LOW (ref 39.0–52.0)
HEMOGLOBIN: 8 g/dL — AB (ref 13.0–17.0)
MCH: 25.2 pg — ABNORMAL LOW (ref 26.0–34.0)
MCHC: 27.8 g/dL — ABNORMAL LOW (ref 30.0–36.0)
MCV: 90.6 fL (ref 78.0–100.0)
PLATELETS: 267 10*3/uL (ref 150–400)
RBC: 3.18 MIL/uL — AB (ref 4.22–5.81)
RDW: 15.6 % — ABNORMAL HIGH (ref 11.5–15.5)
WBC: 7.8 10*3/uL (ref 4.0–10.5)

## 2015-06-15 MED ORDER — INSULIN ASPART 100 UNIT/ML ~~LOC~~ SOLN
4.0000 [IU] | SUBCUTANEOUS | Status: DC
  Administered 2015-06-15 – 2015-06-16 (×5): 4 [IU] via SUBCUTANEOUS

## 2015-06-15 MED ORDER — GENTAMICIN SULFATE 40 MG/ML IJ SOLN
7.0000 mg/kg | Freq: Once | INTRAVENOUS | Status: AC
  Administered 2015-06-15: 600 mg via INTRAVENOUS
  Filled 2015-06-15: qty 15

## 2015-06-15 MED ORDER — SODIUM CHLORIDE 0.9 % IV SOLN
1.0000 g | Freq: Three times a day (TID) | INTRAVENOUS | Status: DC
  Administered 2015-06-15 – 2015-06-18 (×9): 1 g via INTRAVENOUS
  Filled 2015-06-15 (×11): qty 1
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
## 2015-06-15 NOTE — Progress Notes (Signed)
Patient's condition and d/c plan updated with unit RNCM who indicated that patient is not medically stable and will undergo a Bronchoscopy tomorrow.  CSW services will continue to monitor and assist with d/c when medically stable.  Lorri Frederickonna T. Jaci LazierCrowder, KentuckyLCSW [phone]

## 2015-06-15 NOTE — Progress Notes (Signed)
Inpatient Diabetes Program Recommendations  AACE/ADA: New Consensus Statement on Inpatient Glycemic Control (2015)  Target Ranges:  Prepandial:   less than 140 mg/dL      Peak postprandial:   less than 180 mg/dL (1-2 hours)      Critically ill patients:  140 - 180 mg/dL   Results for [name], Anthony (MRN [id number]) as of 06/15/2015 13:24  Ref. Range 06/14/2015 01:12 06/14/2015 06:00 06/14/2015 07:46 06/14/2015 11:45 06/14/2015 17:32 06/14/2015 20:00  Glucose-Capillary Latest Ref Range: 65-99 mg/dL 102215 (H) 725204 (H) 366216 (H) 224 (H) 213 (H) 225 (H)    Results for [name], Anthony (MRN [id number]) as of 06/15/2015 13:24  Ref. Range 06/15/2015 00:40 06/15/2015 04:44 06/15/2015 07:41 06/15/2015 12:21  Glucose-Capillary Latest Ref Range: 65-99 mg/dL 956267 (H) 387274 (H) 564165 (H) 173 (H)    Home DM Meds: Lantus 13 units bid       Novolog SSI  Current Insulin Orders: Lantus 15 units QHS      Novolog Moderate SSI (0-15 units) Q4 hours     -Patient currently getting continuous tube feeds at 75 cc/hour.  -Glucose levels consistently elevated.     MD- Please consider starting Novolog Tube Feed coverage to help better manage glucose levels-  Novolog 4 units Q4 hours (hold if tube feeds held for any reason)      --Will follow patient during hospitalization--  Ambrose FinlandJeannine Johnston Taisha Pennebaker RN, MSN, CDE Diabetes Coordinator Inpatient Glycemic Control Team Team Pager: [phone] (8a-5p)

## 2015-06-15 NOTE — Progress Notes (Deleted)
ANTIBIOTIC CONSULT NOTE - INITIAL  Pharmacy Consult for Meropenem + Gentamicin Indication: Pseudomonas PNA  No Known Allergies  Patient Measurements: Height:  (188 cm) Weight: 189 lb (85.73 kg) IBW/kg (Calculated) : 82.2  Vital Signs: Temp: 97.6 F (36.4 C) (10/25 0744) Temp Source: Axillary (10/25 0744) BP: 123/60 mmHg (10/25 1144) Pulse Rate: 58 (10/25 1144) Intake/Output from previous day: 10/24 0701 - 10/25 0700 In: 2156.4 [I.V.:230.2; NG/GT:1726.3; IV Piggyback:200] Out: 500 [Urine:500] Intake/Output from this shift:    Labs:  Recent Labs  06/13/15 0555 06/14/15 0313 06/15/15 0337  WBC 11.3* 6.6 7.8  HGB 7.9* 8.0* 8.0*  PLT 283 279 267  CREATININE 0.67 0.54* 0.51*   Estimated Creatinine Clearance: 102.8 mL/min (by C-G formula based on Cr of 0.51). No results for input(s): VANCOTROUGH, VANCOPEAK, VANCORANDOM, GENTTROUGH, GENTPEAK, GENTRANDOM, TOBRATROUGH, TOBRAPEAK, TOBRARND, AMIKACINPEAK, AMIKACINTROU, AMIKACIN in the last 72 hours.   Microbiology: Recent Results (from the past 720 hour(s))  Blood Culture (routine x 2)     Status: None   Collection Time: 05/23/15  1:50 PM  Result Value Ref Range Status   Specimen Description BLOOD RIGHT ANTECUBITAL  Final   Special Requests   Final    BOTTLES DRAWN AEROBIC AND ANAEROBIC 10CC BLUE, 5CC RED   Culture NO GROWTH 5 DAYS  Final   Report Status 05/28/2015 FINAL  Final  Blood Culture (routine x 2)     Status: None   Collection Time: 05/23/15  1:54 PM  Result Value Ref Range Status   Specimen Description BLOOD RIGHT HAND  Final   Special Requests BOTTLES DRAWN AEROBIC ONLY 10CC  Final   Culture  Setup Time   Final    GRAM POSITIVE COCCI IN CLUSTERS AEROBIC BOTTLE ONLY CRITICAL RESULT CALLED TO, READ BACK BY AND VERIFIED WITH: L SHORT RN 2138 05/24/15 A BROWNING    Culture   Final    STAPHYLOCOCCUS SPECIES (COAGULASE NEGATIVE) THE SIGNIFICANCE OF ISOLATING THIS ORGANISM FROM A SINGLE SET OF BLOOD CULTURES  WHEN MULTIPLE SETS ARE DRAWN IS UNCERTAIN. PLEASE NOTIFY THE MICROBIOLOGY DEPARTMENT WITHIN ONE WEEK IF SPECIATION AND SENSITIVITIES ARE REQUIRED.    Report Status 05/26/2015 FINAL  Final  Urine culture     Status: None   Collection Time: 05/23/15  2:15 PM  Result Value Ref Range Status   Specimen Description URINE, CATHETERIZED  Final   Special Requests NONE  Final   Culture >=100,000 COLONIES/mL PSEUDOMONAS AERUGINOSA  Final   Report Status 05/25/2015 FINAL  Final   Organism ID, Bacteria PSEUDOMONAS AERUGINOSA  Final      Susceptibility   Pseudomonas aeruginosa - MIC*    CEFTAZIDIME >=64 RESISTANT Resistant     CIPROFLOXACIN <=0.25 SENSITIVE Sensitive     GENTAMICIN <=1 SENSITIVE Sensitive     IMIPENEM 2 SENSITIVE Sensitive     CEFEPIME 32 RESISTANT Resistant     * >=100,000 COLONIES/mL PSEUDOMONAS AERUGINOSA  MRSA PCR Screening     Status: None   Collection Time: 05/23/15  7:19 PM  Result Value Ref Range Status   MRSA by PCR NEGATIVE NEGATIVE Final    Comment:        The GeneXpert MRSA Assay (FDA approved for NASAL specimens only), is one component of a comprehensive MRSA colonization surveillance program. It is not intended to diagnose MRSA infection nor to guide or monitor treatment for MRSA infections.   Culture, blood (routine x 2)     Status: None   Collection Time: 06/01/15  3:36 PM  Result Value Ref Range Status   Specimen Description BLOOD LEFT HAND  Final   Special Requests BOTTLES DRAWN AEROBIC ONLY  4CC  Final   Culture NO GROWTH 5 DAYS  Final   Report Status 06/06/2015 FINAL  Final  Culture, blood (routine x 2)     Status: None   Collection Time: 06/01/15  3:49 PM  Result Value Ref Range Status   Specimen Description BLOOD LEFT HAND  Final   Special Requests BOTTLES DRAWN AEROBIC ONLY  5CC  Final   Culture NO GROWTH 5 DAYS  Final   Report Status 06/06/2015 FINAL  Final  Culture, respiratory (NON-Expectorated)     Status: None   Collection Time: 06/01/15   6:26 PM  Result Value Ref Range Status   Specimen Description TRACHEAL ASPIRATE  Final   Special Requests NONE  Final   Gram Stain   Final    ABUNDANT WBC PRESENT, PREDOMINANTLY PMN NO SQUAMOUS EPITHELIAL CELLS SEEN FEW GRAM NEGATIVE RODS    Culture MODERATE PSEUDOMONAS AERUGINOSA  Final   Report Status 06/04/2015 FINAL  Final   Organism ID, Bacteria PSEUDOMONAS AERUGINOSA  Final      Susceptibility   Pseudomonas aeruginosa - MIC*    CEFEPIME 16 INTERMEDIATE Intermediate     CEFTAZIDIME >=64 RESISTANT Resistant     CIPROFLOXACIN 1 SENSITIVE Sensitive     GENTAMICIN <=1 SENSITIVE Sensitive     IMIPENEM 2 SENSITIVE Sensitive     TOBRAMYCIN Value in next row Sensitive      <=1 SENSITIVEPerformed at [facility]    * MODERATE PSEUDOMONAS AERUGINOSA  Culture, blood (routine x 2)     Status: None (Preliminary result)   Collection Time: 06/12/15  1:28 AM  Result Value Ref Range Status   Specimen Description BLOOD RIGHT HAND  Final   Special Requests BOTTLES DRAWN AEROBIC AND ANAEROBIC 7CC  Final   Culture NO GROWTH 2 DAYS  Final   Report Status PENDING  Incomplete  Culture, blood (routine x 2)     Status: None (Preliminary result)   Collection Time: 06/12/15  1:33 AM  Result Value Ref Range Status   Specimen Description BLOOD RIGHT HAND  Final   Special Requests BOTTLES DRAWN AEROBIC AND ANAEROBIC 5CC  Final   Culture NO GROWTH 2 DAYS  Final   Report Status PENDING  Incomplete    Medical History: Past Medical History  Diagnosis Date  . Diabetes mellitus without complication (HCC)   . HIV disease (HCC)   . Hypertension   . Reflux   . Depressed   . Quadriplegia (HCC)   . Stage IV pressure ulcer (HCC)    Assessment: Anthony Avila with hx HIV, quad with tach and chronic vent support who transferred from [facility] on 10/2 with AMS. On 10/11 a trach aspirate grew moderate Pseudomonas which was treated with Primaxin for 14 days. CCM concerned due to worsening CXR despite adequate  antibiotic and treatment duration. ID has been consulted and the plan is to repeat cultures and to continue treating with Meropenem + Gentamicin.   Goal of Therapy:  Gentamicin trough level <2 mcg/ml  Plan:  Gentamicin IV 600mg  q24h  Meropenem IV 1g q8h Obtain 10-hour level; adjust frequency per [place] nomogram Expected duration 7 days with resolution of temperature and/or normalization of WBC  Trend s/sx of infection, renal fxn   Gus HeightPhillip Chikita Dogan, PharmD Candidate 06/15/2015,12:42 PM

## 2015-06-15 NOTE — Progress Notes (Signed)
PT Cancellation Note  Patient Details Name: [name] MRN: [id number] DOB: 11/22/1945   Cancelled Treatment:    Reason Eval/Treat Not Completed: Medical issues which prohibited therapy  I spoke with Dr. Alen BleacherMikail this morning. We have decided to hold hydrotherapy today due to his episode of bradycardia and hypoxia yesterday, which occurred during his hydrotherapy treatment. He is currently bradycardic in the 40s and I believe the prolonged sidelying positioning will dangerously exacerbate his volatile vitals. We will continue to follow his case and see how he is trending tomorrow before resuming hydrotherapy treatments.  Spoke with RN and provided instructions to change dressing today with: Santyl Moist to dry, fluffed gauze using normal saline Abd pad Skin prep Tape   [place]erton Mount[place]bour, Anthony Littman S 06/15/2015, 8:23 AM Sunday SpillersLogan Avila [place] [phone]

## 2015-06-15 NOTE — Progress Notes (Signed)
ANTIBIOTIC CONSULT NOTE - INITIAL  Pharmacy Consult for Meropenem + Gentamicin Indication: Pseudomonas PNA  No Known Allergies  Patient Measurements: Height:  (188 cm) Weight: 189 lb (85.73 kg) IBW/kg (Calculated) : 82.2  Vital Signs: Temp: 97.6 F (36.4 C) (10/25 0744) Temp Source: Axillary (10/25 0744) BP: 123/60 mmHg (10/25 1144) Pulse Rate: 58 (10/25 1144) Intake/Output from previous day: 10/24 0701 - 10/25 0700 In: 2156.4 [I.V.:230.2; NG/GT:1726.3; IV Piggyback:200] Out: 500 [Urine:500] Intake/Output from this shift:    Labs:  Recent Labs  06/13/15 0555 06/14/15 0313 06/15/15 0337  WBC 11.3* 6.6 7.8  HGB 7.9* 8.0* 8.0*  PLT 283 279 267  CREATININE 0.67 0.54* 0.51*   Estimated Creatinine Clearance: 102.8 mL/min (by C-G formula based on Cr of 0.51). No results for input(s): VANCOTROUGH, VANCOPEAK, VANCORANDOM, GENTTROUGH, GENTPEAK, GENTRANDOM, TOBRATROUGH, TOBRAPEAK, TOBRARND, AMIKACINPEAK, AMIKACINTROU, AMIKACIN in the last 72 hours.   Microbiology: Recent Results (from the past 720 hour(s))  Blood Culture (routine x 2)     Status: None   Collection Time: 05/23/15  1:50 PM  Result Value Ref Range Status   Specimen Description BLOOD RIGHT ANTECUBITAL  Final   Special Requests   Final    BOTTLES DRAWN AEROBIC AND ANAEROBIC 10CC BLUE, 5CC RED   Culture NO GROWTH 5 DAYS  Final   Report Status 05/28/2015 FINAL  Final  Blood Culture (routine x 2)     Status: None   Collection Time: 05/23/15  1:54 PM  Result Value Ref Range Status   Specimen Description BLOOD RIGHT HAND  Final   Special Requests BOTTLES DRAWN AEROBIC ONLY 10CC  Final   Culture  Setup Time   Final    GRAM POSITIVE COCCI IN CLUSTERS AEROBIC BOTTLE ONLY CRITICAL RESULT CALLED TO, READ BACK BY AND VERIFIED WITH: L SHORT RN 2138 05/24/15 A BROWNING    Culture   Final    STAPHYLOCOCCUS SPECIES (COAGULASE NEGATIVE) THE SIGNIFICANCE OF ISOLATING THIS ORGANISM FROM A SINGLE SET OF BLOOD CULTURES  WHEN MULTIPLE SETS ARE DRAWN IS UNCERTAIN. PLEASE NOTIFY THE MICROBIOLOGY DEPARTMENT WITHIN ONE WEEK IF SPECIATION AND SENSITIVITIES ARE REQUIRED.    Report Status 05/26/2015 FINAL  Final  Urine culture     Status: None   Collection Time: 05/23/15  2:15 PM  Result Value Ref Range Status   Specimen Description URINE, CATHETERIZED  Final   Special Requests NONE  Final   Culture >=100,000 COLONIES/mL PSEUDOMONAS AERUGINOSA  Final   Report Status 05/25/2015 FINAL  Final   Organism ID, Bacteria PSEUDOMONAS AERUGINOSA  Final      Susceptibility   Pseudomonas aeruginosa - MIC*    CEFTAZIDIME >=64 RESISTANT Resistant     CIPROFLOXACIN <=0.25 SENSITIVE Sensitive     GENTAMICIN <=1 SENSITIVE Sensitive     IMIPENEM 2 SENSITIVE Sensitive     CEFEPIME 32 RESISTANT Resistant     * >=100,000 COLONIES/mL PSEUDOMONAS AERUGINOSA  MRSA PCR Screening     Status: None   Collection Time: 05/23/15  7:19 PM  Result Value Ref Range Status   MRSA by PCR NEGATIVE NEGATIVE Final    Comment:        The GeneXpert MRSA Assay (FDA approved for NASAL specimens only), is one component of a comprehensive MRSA colonization surveillance program. It is not intended to diagnose MRSA infection nor to guide or monitor treatment for MRSA infections.   Culture, blood (routine x 2)     Status: None   Collection Time: 06/01/15  3:36 PM  Result Value Ref Range Status   Specimen Description BLOOD LEFT HAND  Final   Special Requests BOTTLES DRAWN AEROBIC ONLY  4CC  Final   Culture NO GROWTH 5 DAYS  Final   Report Status 06/06/2015 FINAL  Final  Culture, blood (routine x 2)     Status: None   Collection Time: 06/01/15  3:49 PM  Result Value Ref Range Status   Specimen Description BLOOD LEFT HAND  Final   Special Requests BOTTLES DRAWN AEROBIC ONLY  5CC  Final   Culture NO GROWTH 5 DAYS  Final   Report Status 06/06/2015 FINAL  Final  Culture, respiratory (NON-Expectorated)     Status: None   Collection Time: 06/01/15   6:26 PM  Result Value Ref Range Status   Specimen Description TRACHEAL ASPIRATE  Final   Special Requests NONE  Final   Gram Stain   Final    ABUNDANT WBC PRESENT, PREDOMINANTLY PMN NO SQUAMOUS EPITHELIAL CELLS SEEN FEW GRAM NEGATIVE RODS    Culture MODERATE PSEUDOMONAS AERUGINOSA  Final   Report Status 06/04/2015 FINAL  Final   Organism ID, Bacteria PSEUDOMONAS AERUGINOSA  Final      Susceptibility   Pseudomonas aeruginosa - MIC*    CEFEPIME 16 INTERMEDIATE Intermediate     CEFTAZIDIME >=64 RESISTANT Resistant     CIPROFLOXACIN 1 SENSITIVE Sensitive     GENTAMICIN <=1 SENSITIVE Sensitive     IMIPENEM 2 SENSITIVE Sensitive     TOBRAMYCIN Value in next row Sensitive      <=1 SENSITIVEPerformed at [facility]    * MODERATE PSEUDOMONAS AERUGINOSA  Culture, blood (routine x 2)     Status: None (Preliminary result)   Collection Time: 06/12/15  1:28 AM  Result Value Ref Range Status   Specimen Description BLOOD RIGHT HAND  Final   Special Requests BOTTLES DRAWN AEROBIC AND ANAEROBIC 7CC  Final   Culture NO GROWTH 2 DAYS  Final   Report Status PENDING  Incomplete  Culture, blood (routine x 2)     Status: None (Preliminary result)   Collection Time: 06/12/15  1:33 AM  Result Value Ref Range Status   Specimen Description BLOOD RIGHT HAND  Final   Special Requests BOTTLES DRAWN AEROBIC AND ANAEROBIC 5CC  Final   Culture NO GROWTH 2 DAYS  Final   Report Status PENDING  Incomplete    Medical History: Past Medical History  Diagnosis Date  . Diabetes mellitus without complication (HCC)   . HIV disease (HCC)   . Hypertension   . Reflux   . Depressed   . Quadriplegia (HCC)   . Stage IV pressure ulcer (HCC)     Assessment: [age] YOM with hx HIV, quad with tach and chronic vent support who transferred from [facility] on 10/2 with AMS. On 10/11 a trach aspirate grew moderate Pseudomonas which was treated with Primaxin for 14 days. CCM concerned due to worsening CXR despite  adequate antibiotic and treatment duration. ID has been consulted and the plan is to repeat cultures and to continue treating with Meropenem + Gentamicin.   Zosyn 10/2 > 10/4 Vanc 10/2 >> 10/5 Cipro 10/4 >> 10/6 Primaxin 10/11 >> 10/25 Meropenem 10/25 >> Gentamicin 10/25 >>  10/2 MRSA PCR neg 10/2 UCx >> PSA (R-cefepime/ceftaz, S-Cipro/Gent/Imi) 10/2 BCx >> 1/2 CoNS (contaminant) 10/11 Trach aspirate >> PSA (R-ceftaz, I-cefepime, S-*Cipro/*Imi/Gent/Tobra) * Note MIC creep towards intermediate with these agents - Cipro + Imi 10/11 BCx >> NG 10/22 BCx >> ngtd 10/25 RCx >>  Goal of Therapy:  Proper antibiotics for infection/cultures adjusted for renal/hepatic function  Gent trough <2 mcg/ml, Gent peak 6-8 mcg/ml  Plan:  1. Meropenem 1g IV every 8 hours 2. Gentamicin 600 mg IV x 1 dose 3. Will obtain a 10 hour Gent random to help determine dose interval for additional Gentamicin doses 4. Will continue to follow renal function, culture results, LOT, and antibiotic de-escalation plans   ElizaGeorgina PillionPharmD, BCPS Clinical Pharmacist Pager: [phone] 06/15/2015 1:00 PM
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
## 2015-06-16 DIAGNOSIS — J9621 Acute and chronic respiratory failure with hypoxia: Secondary | ICD-10-CM

## 2015-06-16 DIAGNOSIS — M542 Cervicalgia: Secondary | ICD-10-CM

## 2015-06-16 DIAGNOSIS — B965 Pseudomonas (aeruginosa) (mallei) (pseudomallei) as the cause of diseases classified elsewhere: Secondary | ICD-10-CM

## 2015-06-16 DIAGNOSIS — J95851 Ventilator associated pneumonia: Secondary | ICD-10-CM | POA: Insufficient documentation

## 2015-06-16 LAB — BASIC METABOLIC PANEL
Anion gap: 6 (ref 5–15)
BUN: 26 mg/dL — AB (ref 6–20)
CALCIUM: 9.1 mg/dL (ref 8.9–10.3)
CHLORIDE: 95 mmol/L — AB (ref 101–111)
CO2: 38 mmol/L — ABNORMAL HIGH (ref 22–32)
CREATININE: 0.47 mg/dL — AB (ref 0.61–1.24)
Glucose, Bld: 159 mg/dL — ABNORMAL HIGH (ref 65–99)
Potassium: 4.5 mmol/L (ref 3.5–5.1)
SODIUM: 139 mmol/L (ref 135–145)

## 2015-06-16 LAB — GLUCOSE, CAPILLARY
GLUCOSE-CAPILLARY: 155 mg/dL — AB (ref 65–99)
GLUCOSE-CAPILLARY: 168 mg/dL — AB (ref 65–99)
GLUCOSE-CAPILLARY: 152 mg/dL — AB (ref 65–99)
Glucose-Capillary: 208 mg/dL — ABNORMAL HIGH (ref 65–99)
Glucose-Capillary: 132 mg/dL — ABNORMAL HIGH (ref 65–99)

## 2015-06-16 LAB — CBC
HCT: 27.2 % — ABNORMAL LOW (ref 39.0–52.0)
Hemoglobin: 7.5 g/dL — ABNORMAL LOW (ref 13.0–17.0)
MCH: 25.1 pg — AB (ref 26.0–34.0)
MCHC: 27.6 g/dL — AB (ref 30.0–36.0)
MCV: 91 fL (ref 78.0–100.0)
PLATELETS: 230 10*3/uL (ref 150–400)
RBC: 2.99 MIL/uL — ABNORMAL LOW (ref 4.22–5.81)
RDW: 15.7 % — AB (ref 11.5–15.5)
WBC: 6.8 10*3/uL (ref 4.0–10.5)

## 2015-06-16 LAB — GENTAMICIN LEVEL, RANDOM: GENTAMICIN RM: 7 ug/mL

## 2015-06-16 MED ORDER — MIDAZOLAM HCL 2 MG/2ML IJ SOLN
INTRAMUSCULAR | Status: AC
  Filled 2015-06-16: qty 4

## 2015-06-16 MED ORDER — FENTANYL CITRATE (PF) 100 MCG/2ML IJ SOLN
100.0000 ug | Freq: Once | INTRAMUSCULAR | Status: AC
  Administered 2015-06-16: 100 ug via INTRAVENOUS

## 2015-06-16 MED ORDER — CLONIDINE HCL 0.1 MG PO TABS
0.1000 mg | ORAL_TABLET | Freq: Two times a day (BID) | ORAL | Status: AC
  Administered 2015-06-17 – 2015-06-18 (×4): 0.1 mg via ORAL
  Filled 2015-06-16 (×4): qty 1

## 2015-06-16 MED ORDER — GENTAMICIN SULFATE 40 MG/ML IJ SOLN
7.0000 mg/kg | INTRAVENOUS | Status: DC
  Administered 2015-06-17 – 2015-06-21 (×4): 600 mg via INTRAVENOUS
  Filled 2015-06-16 (×6): qty 15

## 2015-06-16 MED ORDER — MIDAZOLAM HCL 2 MG/2ML IJ SOLN
4.0000 mg | Freq: Once | INTRAMUSCULAR | Status: AC
  Administered 2015-06-16: 4 mg via INTRAVENOUS

## 2015-06-16 MED ORDER — MIDAZOLAM HCL 2 MG/2ML IJ SOLN: 4.0000 mg | Freq: Once | INTRAMUSCULAR | Status: AC

## 2015-06-16 MED ORDER — FENTANYL CITRATE (PF) 100 MCG/2ML IJ SOLN: 200.0000 ug | Freq: Once | INTRAMUSCULAR | Status: DC

## 2015-06-16 NOTE — Progress Notes (Signed)
Pt refused suctioning and CPT.

## 2015-06-16 NOTE — Progress Notes (Signed)
Pt refused CPT 

## 2015-06-16 NOTE — Progress Notes (Signed)
PT Hydrotherapy Cancellation Note  Patient Details Name: Anthony Avila MRN: [id number] DOB: 07/21/1946   Cancelled Treatment:    Reason Eval/Treat Not Completed: Patient declined, no reason specified Patient requests that Hydrotherapy be held until tomorrow. States he is still having trouble breathing and would like to have bronchoscopy completed first. Spoke with RN who will change dressing today. Santyl, moist to dry fluffed gauze with normal saline, skin prep, Abd pad, and tape.  [place], Meldrick Buttery S 06/16/2015, 9:01 AM Charlsie MerlesLogan Secor Kalid Ghan, PT [phone]

## 2015-06-16 NOTE — Progress Notes (Signed)
ANTIBIOTIC CONSULT NOTE - follow up Pharmacy Consult for Meropenem + Gentamicin Indication: Pseudomonas PNA  No Known Allergies  Patient Measurements: Height: 6\' 2"  (188 cm) Weight: 189 lb (85.73 kg) IBW/kg (Calculated) : 82.2  Vital Signs: Temp: 97.9 F (36.6 C) (10/26 0852) Temp Source: Oral (10/26 0852) BP: 122/53 mmHg (10/26 0852) Pulse Rate: 70 (10/26 0852) Intake/Output from previous day: 10/25 0701 - 10/26 0700 In: 1800 [NG/GT:1800] Out: 750 [Urine:750] Intake/Output from this shift:    Labs:  Recent Labs  06/14/15 0313 06/15/15 0337 06/16/15 0139  WBC 6.6 7.8 6.8  HGB 8.0* 8.0* 7.5*  PLT 279 267 230  CREATININE 0.54* 0.51* 0.47*   Estimated Creatinine Clearance: 102.8 mL/min (by C-G formula based on Cr of 0.47).  Recent Labs  06/16/15 0139  GENTRANDOM 7.0     Microbiology: Recent Results (from the past 720 hour(s))  Blood Culture (routine x 2)     Status: None   Collection Time: 05/23/15  1:50 PM  Result Value Ref Range Status   Specimen Description BLOOD RIGHT ANTECUBITAL  Final   Special Requests   Final    BOTTLES DRAWN AEROBIC AND ANAEROBIC 10CC BLUE, 5CC RED   Culture NO GROWTH 5 DAYS  Final   Report Status 05/28/2015 FINAL  Final  Blood Culture (routine x 2)     Status: None   Collection Time: 05/23/15  1:54 PM  Result Value Ref Range Status   Specimen Description BLOOD RIGHT HAND  Final   Special Requests BOTTLES DRAWN AEROBIC ONLY 10CC  Final   Culture  Setup Time   Final    GRAM POSITIVE COCCI IN CLUSTERS AEROBIC BOTTLE ONLY CRITICAL RESULT CALLED TO, READ BACK BY AND VERIFIED WITH: L SHORT RN 2138 05/24/15 A BROWNING    Culture   Final    STAPHYLOCOCCUS SPECIES (COAGULASE NEGATIVE) THE SIGNIFICANCE OF ISOLATING THIS ORGANISM FROM A SINGLE SET OF BLOOD CULTURES WHEN MULTIPLE SETS ARE DRAWN IS UNCERTAIN. PLEASE NOTIFY THE MICROBIOLOGY DEPARTMENT WITHIN ONE WEEK IF SPECIATION AND SENSITIVITIES ARE REQUIRED.    Report Status 05/26/2015  FINAL  Final  Urine culture     Status: None   Collection Time: 05/23/15  2:15 PM  Result Value Ref Range Status   Specimen Description URINE, CATHETERIZED  Final   Special Requests NONE  Final   Culture >=100,000 COLONIES/mL PSEUDOMONAS AERUGINOSA  Final   Report Status 05/25/2015 FINAL  Final   Organism ID, Bacteria PSEUDOMONAS AERUGINOSA  Final      Susceptibility   Pseudomonas aeruginosa - MIC*    CEFTAZIDIME >=64 RESISTANT Resistant     CIPROFLOXACIN <=0.25 SENSITIVE Sensitive     GENTAMICIN <=1 SENSITIVE Sensitive     IMIPENEM 2 SENSITIVE Sensitive     CEFEPIME 32 RESISTANT Resistant     * >=100,000 COLONIES/mL PSEUDOMONAS AERUGINOSA  MRSA PCR Screening     Status: None   Collection Time: 05/23/15  7:19 PM  Result Value Ref Range Status   MRSA by PCR NEGATIVE NEGATIVE Final    Comment:        The GeneXpert MRSA Assay (FDA approved for NASAL specimens only), is one component of a comprehensive MRSA colonization surveillance program. It is not intended to diagnose MRSA infection nor to guide or monitor treatment for MRSA infections.   Culture, blood (routine x 2)     Status: None   Collection Time: 06/01/15  3:36 PM  Result Value Ref Range Status   Specimen Description BLOOD LEFT HAND  Final   Special Requests BOTTLES DRAWN AEROBIC ONLY  4CC  Final   Culture NO GROWTH 5 DAYS  Final   Report Status 06/06/2015 FINAL  Final  Culture, blood (routine x 2)     Status: None   Collection Time: 06/01/15  3:49 PM  Result Value Ref Range Status   Specimen Description BLOOD LEFT HAND  Final   Special Requests BOTTLES DRAWN AEROBIC ONLY  5CC  Final   Culture NO GROWTH 5 DAYS  Final   Report Status 06/06/2015 FINAL  Final  Culture, respiratory (NON-Expectorated)     Status: None   Collection Time: 06/01/15  6:26 PM  Result Value Ref Range Status   Specimen Description TRACHEAL ASPIRATE  Final   Special Requests NONE  Final   Gram Stain   Final    ABUNDANT WBC PRESENT,  PREDOMINANTLY PMN NO SQUAMOUS EPITHELIAL CELLS SEEN FEW GRAM NEGATIVE RODS    Culture MODERATE PSEUDOMONAS AERUGINOSA  Final   Report Status 06/04/2015 FINAL  Final   Organism ID, Bacteria PSEUDOMONAS AERUGINOSA  Final      Susceptibility   Pseudomonas aeruginosa - MIC*    CEFEPIME 16 INTERMEDIATE Intermediate     CEFTAZIDIME >=64 RESISTANT Resistant     CIPROFLOXACIN 1 SENSITIVE Sensitive     GENTAMICIN <=1 SENSITIVE Sensitive     IMIPENEM 2 SENSITIVE Sensitive     TOBRAMYCIN Value in next row Sensitive      <=1 SENSITIVEPerformed at [facility]    * MODERATE PSEUDOMONAS AERUGINOSA  Culture, blood (routine x 2)     Status: None (Preliminary result)   Collection Time: 06/12/15  1:28 AM  Result Value Ref Range Status   Specimen Description BLOOD RIGHT HAND  Final   Special Requests BOTTLES DRAWN AEROBIC AND ANAEROBIC 7CC  Final   Culture NO GROWTH 3 DAYS  Final   Report Status PENDING  Incomplete  Culture, blood (routine x 2)     Status: None (Preliminary result)   Collection Time: 06/12/15  1:33 AM  Result Value Ref Range Status   Specimen Description BLOOD RIGHT HAND  Final   Special Requests BOTTLES DRAWN AEROBIC AND ANAEROBIC 5CC  Final   Culture NO GROWTH 3 DAYS  Final   Report Status PENDING  Incomplete    Medical History: Past Medical History  Diagnosis Date  . Diabetes mellitus without complication (HCC)   . HIV disease (HCC)   . Hypertension   . Reflux   . Depressed   . Quadriplegia (HCC)   . Stage IV pressure ulcer (HCC)     Assessment: 56 YOM with hx HIV, quad with tach and chronic vent support who transferred from [facility] on 10/2 with AMS. On 10/11 a trach aspirate grew moderate Pseudomonas which was treated with Primaxin for 14 days. CCM concerned due to worsening CXR despite adequate antibiotic and treatment duration. ID has been consulted and the plan is to repeat cultures and to continue treating with Meropenem + Gentamicin.   Zosyn 10/2 >  10/4 Vanc 10/2 >> 10/5 Cipro 10/4 >> 10/6 Primaxin 10/11 >> 10/25 Meropenem 10/25 >> Gentamicin 10/25 >>  10/26 10 hr gent level = 7 mcg/ml - qualifies for q36 hr interval  10/2 MRSA PCR neg 10/2 UCx >> PSA (R-cefepime/ceftaz, S-Cipro/Gent/Imi) 10/2 BCx >> 1/2 CoNS (contaminant) 10/11 Trach aspirate >> PSA (R-ceftaz, I-cefepime, S-*Cipro/*Imi/Gent/[place]) * Note MIC creep towards intermediate with these agents - Cipro + Imi 10/11 BCx >> NG 10/22 BCx >> ngtd  10/25 RCx >>  Goal of Therapy:  Proper antibiotics for infection/cultures adjusted for renal/hepatic function  Gent trough <2 mcg/ml, Gent peak 6-8 mcg/ml  Plan:   Continue Meropenem 1g IV every 8 hours  Gentamicin 600 mg IV q36 hours - next dose due 10/27 at 0400 am  Will continue to follow renal function, culture results, LOT, and antibiotic de-escalation plans   Herby Abraham, Pharm.D. [phone] 06/16/2015 10:27 AM
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
## 2015-06-16 NOTE — Procedures (Signed)
Bronchoscopy Procedure Note Anthony Avila 05/24/1946  Procedure: Bronchoscopy Indications: Remove secretions  Procedure Details Consent: Risks of procedure as well as the alternatives and risks of each were explained to the (patient/caregiver).  Consent for procedure obtained. Time Out: Verified patient identification, verified procedure, site/side was marked, verified correct patient position, special equipment/implants available, medications/allergies/relevent history reviewed, required imaging and test results available.  Performed  In preparation for procedure, patient was given 100% FiO2 and bronchoscope lubricated. Sedation: fent versed  Airway entered and the following bronchi were examined: RUL, RML, RLL, LUL, LLL and Bronchi.   Procedures performed: Brushings performed - no Bronchoscope removed.    Evaluation Hemodynamic Status: BP stable throughout; O2 sats: stable throughout Patient's Current Condition: stable Specimens:  Sent purulent fluid Complications: No apparent complications Patient did tolerate procedure well.   Anthony Avila,Anthony J. 06/16/2015  1. Pus all over carina, obstructing rt main, BI, Left lower lobe, thick, removed, lavaged 2. BAL LLL   Anthony Avila AliasFeinstein, MD, FACP Pgr: [phone] [facility]

## 2015-06-17 DIAGNOSIS — B954 Other streptococcus as the cause of diseases classified elsewhere: Secondary | ICD-10-CM

## 2015-06-17 LAB — BASIC METABOLIC PANEL
ANION GAP: 8 (ref 5–15)
BUN: 24 mg/dL — AB (ref 6–20)
CALCIUM: 9.1 mg/dL (ref 8.9–10.3)
CO2: 38 mmol/L — AB (ref 22–32)
Chloride: 95 mmol/L — ABNORMAL LOW (ref 101–111)
Creatinine, Ser: 0.46 mg/dL — ABNORMAL LOW (ref 0.61–1.24)
GFR calc Af Amer: >60 mL/min (ref >60)
GFR calc non Af Amer: >60 mL/min (ref >60)
GLUCOSE: 136 mg/dL — AB (ref 65–99)
POTASSIUM: 4.5 mmol/L (ref 3.5–5.1)
Sodium: 141 mmol/L (ref 135–145)

## 2015-06-17 LAB — GLUCOSE, CAPILLARY
GLUCOSE-CAPILLARY: 136 mg/dL — AB (ref 65–99)
GLUCOSE-CAPILLARY: 139 mg/dL — AB (ref 65–99)
GLUCOSE-CAPILLARY: 144 mg/dL — AB (ref 65–99)
GLUCOSE-CAPILLARY: 203 mg/dL — AB (ref 65–99)
Glucose-Capillary: 136 mg/dL — ABNORMAL HIGH (ref 65–99)
Glucose-Capillary: 147 mg/dL — ABNORMAL HIGH (ref 65–99)
Glucose-Capillary: 197 mg/dL — ABNORMAL HIGH (ref 65–99)

## 2015-06-17 LAB — CBC
HEMATOCRIT: 28.4 % — AB (ref 39.0–52.0)
HEMOGLOBIN: 7.9 g/dL — AB (ref 13.0–17.0)
MCH: 25.5 pg — AB (ref 26.0–34.0)
MCHC: 27.8 g/dL — AB (ref 30.0–36.0)
MCV: 91.6 fL (ref 78.0–100.0)
Platelets: 255 10*3/uL (ref 150–400)
RBC: 3.1 MIL/uL — ABNORMAL LOW (ref 4.22–5.81)
RDW: 16.2 % — AB (ref 11.5–15.5)
WBC: 5.8 10*3/uL (ref 4.0–10.5)

## 2015-06-17 LAB — T-HELPER CELLS (CD4) COUNT (NOT AT ARMC)
CD4 T CELL HELPER: 54 % (ref 33–55)
CD4 T Cell Abs: 420 /uL (ref 400–2700)

## 2015-06-17 LAB — CULTURE, BLOOD (ROUTINE X 2)
Culture: NO GROWTH
Culture: NO GROWTH

## 2015-06-17 NOTE — Progress Notes (Signed)
Pt refused CPT at this time. RT will attempt later today.

## 2015-06-17 NOTE — Progress Notes (Signed)
RT did not suction the pt at this time due to the RN had just suctioned him.

## 2015-06-17 NOTE — Progress Notes (Signed)
Pt has a bed available at [facility] today if medically stable for discharge  CSW will continue to follow.  Merlyn LotJenna Holoman, LCSWA Clinical Social Worker [phone]

## 2015-06-17 NOTE — Progress Notes (Signed)
Physical Therapy Wound Treatment Patient Details  Name: Anthony Avila MRN: [id number] Date of Birth: 02/13/1946  Today's Date: 06/17/2015 Time: 2703-5009 Time Calculation (min): 29 min  Subjective  Subjective: Mouthing words appropriately. Asked to be deep suctioned prior to treat. 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 white drainage along with blood with foul smelling odor, bone palpable (Active)  Dressing Type ABD;Barrier Film (skin prep);Gauze (Comment);Tape dressing;Moist to dry;Other (Comment) 06/17/2015  9:18 AM  Dressing Changed;Clean;Dry;Intact 06/17/2015  9:18 AM  Dressing Change Frequency Daily 06/17/2015  9:18 AM  State of Healing Early/partial granulation 06/17/2015  9:18 AM  Site / Wound Assessment Yellow;Red;Pink 06/17/2015  9:18 AM  % Wound base Red or Granulating 60% 06/17/2015  9:18 AM  % Wound base Yellow 35% 06/17/2015  9:18 AM  % Wound base Black 0% 06/17/2015  9:18 AM  % Wound base Other (Comment) 5% 06/17/2015  9:18 AM  Peri-wound Assessment Intact 06/17/2015  9:18 AM  Wound Length (cm) 9.3 cm 06/17/2015  9:18 AM  Wound Width (cm) 6.3 cm 06/17/2015  9:18 AM  Wound Depth (cm) 2.4 cm 06/17/2015  9:18 AM  Undermining (cm) 1.7 from 7:00 to 9:00 progressing to 2.5cm from 9:00 to 3:00 06/17/2015  9:18 AM  Margins Unattached edges (unapproximated) 06/17/2015  9:18 AM  Drainage Amount Moderate 06/17/2015  9:18 AM  Drainage Description Odor;Serosanguineous;Purulent 06/17/2015  9:18 AM  Treatment Debridement (Selective);Hydrotherapy (Pulse lavage);Packing (Saline gauze);Tape changed;Other (Comment) 06/17/2015  9:18 AM  Santyl applied to wound bed prior to applying dressing.  Hydrotherapy Pulsed lavage therapy - wound location: sacrum Pulsed Lavage with Suction (psi): 8 psi (4-12 psi) Pulsed  Lavage with Suction - Normal Saline Used: 1000 mL Pulsed Lavage Tip: Tip with splash shield Selective Debridement Selective Debridement - Location: sacrum Selective Debridement - Tools Used: Forceps;Scissors Selective Debridement - Tissue Removed: yellow slough   Wound Assessment and Plan  Wound Therapy - Assess/Plan/Recommendations Wound Therapy - Clinical Statement: Good granulation, a little over 1/3 of tissue still with eschar adhearant to wound. Pt desats to mid 80s briefly but improved after deep suction. May consider rolling onto Lt side during treatment if pt still having low saturations. Wound Therapy - Functional Problem List: Decreased sitting due to pressure sore Factors Delaying/Impairing Wound Healing: Altered sensation;Incontinence;Immobility;Multiple medical problems;Polypharmacy Hydrotherapy Plan: Debridement;Dressing change;Patient/family education;Pulsatile lavage with suction Wound Therapy - Frequency: 6X / week Wound Therapy - Follow Up Recommendations: Other (comment) (LTACH) Wound Plan: See above  Wound Therapy Goals- Improve the function of patient's integumentary system by progressing the wound(s) through the phases of wound healing (inflammation - proliferation - remodeling) by: Decrease Necrotic Tissue to: 10 Decrease Necrotic Tissue - Progress: Progressing toward goal Increase Granulation Tissue to: 90 Increase Granulation Tissue - Progress: Progressing toward goal  Goals will be updated until maximal potential achieved or discharge criteria met.  Discharge criteria: when goals achieved, discharge from hospital, MD decision/surgical intervention, no progress towards goals, refusal/missing three consecutive treatments without notification or medical reason.  GP     [name] 06/17/2015, 9:26 AM  [name], [name]

## 2015-06-17 NOTE — Progress Notes (Signed)
Trach Care Progression Note   Patient Details Name: Anthony Avila MRN: [id number] DOB: 05/11/1946 Today's Date: 06/17/2015   Tracheostomy Assessment    Tracheostomy Shiley 7 mm Cuffed (Active)  Status Secured 06/17/2015 12:35 PM  Site Assessment Clean;Dry 06/17/2015 12:35 PM  Site Care Cleansed;Dried;Dressing applied 06/17/2015  6:06 AM  Inner Cannula Care No inner cannula 06/17/2015  7:30 AM  Ties Assessment Clean;Dry;Secure 06/17/2015 12:35 PM  Cuff pressure (cm) 40 cm 06/17/2015  3:19 AM  Emergency Equipment at bedside Yes 06/17/2015 12:35 PM     Care Needs     Respiratory Therapy Tracheostomy: Chronic trach O2 Device: Ventilator FiO2 (%): 40 % SpO2: 98 % Education: Not applicable Follow up recommendations:  (follow for progression) Respiratory barriers to progression:  (mental status)    Speech Language Pathology  SLP chart review complete: Patient not ready for SLP services, Patient does not need SLP services at this time (Trialed in-line PMSV. discontinued efforts per Dr. Tyson AliasFeinstein) Patient may use Passy-Muir Speech Valve: with SLP only PMSV Supervision: Full Follow up Recommendations:  (TBD)   Physical Therapy      Occupational Therapy      Nutritional Patient's Current Diet: Tube feeding Tube Feeding: Vital AF 1.2 Cal Tube Feeding Frequency: Continuous Tube Feeding Strength: Full strength    Case Management/Social Work Level of patient care prior to hospitalization: SNF/Assisted  living facility Insurance payer: Medicare Barriers to progression:  (unable to wean, family resistance to placement options) Anticipated discharge disposition: SNF/Assisted  living facility [facility]    Provider            Roderic Lammert, [name] 06/17/2015, 2:07 PM

## 2015-06-17 NOTE — Progress Notes (Signed)
Patient seen for trach team follow up.  Patient on full vent support at this time.  No education at this time.  All needed equipment at the bedside.
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
## 2015-06-18 ENCOUNTER — Telehealth (HOSPITAL_BASED_OUTPATIENT_CLINIC_OR_DEPARTMENT_OTHER): Payer: Self-pay | Admitting: Emergency Medicine

## 2015-06-18 ENCOUNTER — Inpatient Hospital Stay (HOSPITAL_COMMUNITY): Payer: Medicare Other

## 2015-06-18 LAB — BASIC METABOLIC PANEL
Anion gap: 9 (ref 5–15)
BUN: 22 mg/dL — AB (ref 6–20)
CHLORIDE: 92 mmol/L — AB (ref 101–111)
CO2: 40 mmol/L — ABNORMAL HIGH (ref 22–32)
CREATININE: 0.47 mg/dL — AB (ref 0.61–1.24)
Calcium: 9.7 mg/dL (ref 8.9–10.3)
GFR calc Af Amer: >60 mL/min (ref >60)
GLUCOSE: 60 mg/dL — AB (ref 65–99)
POTASSIUM: 4.5 mmol/L (ref 3.5–5.1)
Sodium: 141 mmol/L (ref 135–145)

## 2015-06-18 LAB — BLOOD GAS, ARTERIAL
ACID-BASE EXCESS: 14.9 mmol/L — AB (ref 0.0–2.0)
BICARBONATE: 40 meq/L — AB (ref 20.0–24.0)
Drawn by: 441371
FIO2: 0.4
O2 SAT: 97.2 %
PEEP/CPAP: 10 cmH2O
PO2 ART: 85 mmHg (ref 80.0–100.0)
PRESSURE SUPPORT: 12 cmH2O
Patient temperature: 97.2
RATE: 12 resp/min
TCO2: 41.8 mmol/L (ref 0–100)
VT: 650 mL
pCO2 arterial: 56.5 mmHg — ABNORMAL HIGH (ref 35.0–45.0)
pH, Arterial: 7.46 — ABNORMAL HIGH (ref 7.350–7.450)

## 2015-06-18 LAB — HIV-1 RNA ULTRAQUANT REFLEX TO GENTYP+
HIV-1 RNA BY PCR: 50 {copies}/mL
HIV-1 RNA Quant, Log: 1.699 {Log_copies}/mL

## 2015-06-18 LAB — BRAIN NATRIURETIC PEPTIDE: B Natriuretic Peptide: 234.7 pg/mL — ABNORMAL HIGH (ref 0.0–100.0)

## 2015-06-18 LAB — CULTURE, RESPIRATORY W GRAM STAIN

## 2015-06-18 LAB — GLUCOSE, CAPILLARY
GLUCOSE-CAPILLARY: 57 mg/dL — AB (ref 65–99)
GLUCOSE-CAPILLARY: 60 mg/dL — AB (ref 65–99)
GLUCOSE-CAPILLARY: 219 mg/dL — AB (ref 65–99)
GLUCOSE-CAPILLARY: 149 mg/dL — AB (ref 65–99)
Glucose-Capillary: 78 mg/dL (ref 65–99)
Glucose-Capillary: 119 mg/dL — ABNORMAL HIGH (ref 65–99)
Glucose-Capillary: 217 mg/dL — ABNORMAL HIGH (ref 65–99)
Glucose-Capillary: 191 mg/dL — ABNORMAL HIGH (ref 65–99)

## 2015-06-18 LAB — FERRITIN: FERRITIN: 210 ng/mL (ref 24–336)

## 2015-06-18 LAB — IRON AND TIBC
Iron: 43 ug/dL — ABNORMAL LOW (ref 45–182)
Saturation Ratios: 20 % (ref 17.9–39.5)
TIBC: 220 ug/dL — ABNORMAL LOW (ref 250–450)
UIBC: 177 ug/dL

## 2015-06-18 LAB — VITAMIN B12: Vitamin B-12: 1019 pg/mL — ABNORMAL HIGH (ref 180–914)

## 2015-06-18 LAB — CULTURE, RESPIRATORY

## 2015-06-18 MED ORDER — FUROSEMIDE 10 MG/ML IJ SOLN
40.0000 mg | Freq: Two times a day (BID) | INTRAMUSCULAR | Status: AC
  Administered 2015-06-18 – 2015-06-20 (×4): 40 mg via INTRAVENOUS
  Filled 2015-06-18 (×4): qty 4

## 2015-06-18 MED ORDER — CLONIDINE HCL 0.1 MG PO TABS
0.1000 mg | ORAL_TABLET | Freq: Every day | ORAL | Status: DC
  Administered 2015-06-19: 0.1 mg via ORAL
  Filled 2015-06-18: qty 1

## 2015-06-18 MED ORDER — CIPROFLOXACIN IN D5W 400 MG/200ML IV SOLN
400.0000 mg | Freq: Three times a day (TID) | INTRAVENOUS | Status: DC
  Administered 2015-06-18 – 2015-06-21 (×10): 400 mg via INTRAVENOUS
  Filled 2015-06-18 (×11): qty 200

## 2015-06-18 NOTE — Progress Notes (Signed)
PT/Hydrotherapy Cancellation Note  Patient Details Name: Anthony Avila MRN: [id number] DOB: 05/25/1946   Cancelled Treatment:    Reason Eval/Treat Not Completed: Medical issues which prohibited therapy. Rolled pt to side to position for hydrotherapy. Dr. Arbutus Leasat present to see wound. Pt with drop in SaO2 to 71% and HR to 30's. Returned to supine and hydrotherapy held at this time. Will re-attempt tomorrow as appropriate.   [name] 06/18/2015, 2:03 PM Fluor CorporationCary Lynn Sissel PT [phone]

## 2015-06-18 NOTE — Care Management Important Message (Signed)
Important Message  Patient Details  Name: Anthony Avila MRN: [id number] Date of Birth: 01/21/1946   Medicare Important Message Given:  Yes-third notification given    [name], Jareb Radoncic Abena 06/18/2015, 11:24 AM

## 2015-06-18 NOTE — Progress Notes (Signed)
Inpatient Diabetes Program Recommendations  AACE/ADA: New Consensus Statement on Inpatient Glycemic Control (2015)  Target Ranges:  Prepandial:   less than 140 mg/dL      Peak postprandial:   less than 180 mg/dL (1-2 hours)      Critically ill patients:  140 - 180 mg/dL   Review of Glycemic Control  Consider reducing Lantus to prevent hypoglycemia. Thank you  Piedad ClimesGina Francia Verry BSN, RN,CDE Inpatient Diabetes Coordinator [phone] (team pager)
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
## 2015-06-18 NOTE — Progress Notes (Signed)
RT consulted MD concerning desat and thick secretions. MD ordered peep to be increased to 10.
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
## 2015-06-18 NOTE — Progress Notes (Signed)
PT was not able to perform hydrotherapy d/t desaturation.  Dr. Arbutus Leasat at bedside.    RN and NT turned and cleaned patient and tolerated it well.
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
## 2015-06-19 DIAGNOSIS — J95851 Ventilator associated pneumonia: Secondary | ICD-10-CM

## 2015-06-19 LAB — BASIC METABOLIC PANEL
Anion gap: 12 (ref 5–15)
BUN: 23 mg/dL — AB (ref 6–20)
CALCIUM: 9.2 mg/dL (ref 8.9–10.3)
CHLORIDE: 85 mmol/L — AB (ref 101–111)
CO2: 40 mmol/L — AB (ref 22–32)
CREATININE: 0.58 mg/dL — AB (ref 0.61–1.24)
GFR calc Af Amer: >60 mL/min (ref >60)
GFR calc non Af Amer: >60 mL/min (ref >60)
Glucose, Bld: 187 mg/dL — ABNORMAL HIGH (ref 65–99)
Potassium: 3.8 mmol/L (ref 3.5–5.1)
Sodium: 137 mmol/L (ref 135–145)

## 2015-06-19 LAB — GLUCOSE, CAPILLARY
GLUCOSE-CAPILLARY: 123 mg/dL — AB (ref 65–99)
GLUCOSE-CAPILLARY: 213 mg/dL — AB (ref 65–99)
GLUCOSE-CAPILLARY: 185 mg/dL — AB (ref 65–99)
Glucose-Capillary: 174 mg/dL — ABNORMAL HIGH (ref 65–99)
Glucose-Capillary: 123 mg/dL — ABNORMAL HIGH (ref 65–99)

## 2015-06-19 LAB — PHOSPHORUS: Phosphorus: 2.6 mg/dL (ref 2.5–4.6)

## 2015-06-19 LAB — MAGNESIUM: Magnesium: 1.9 mg/dL (ref 1.7–2.4)

## 2015-06-19 MED ORDER — AMLODIPINE 1 MG/ML ORAL SUSPENSION: 2.5000 mg | Freq: Every day | ORAL | Status: DC

## 2015-06-19 MED ORDER — VANCOMYCIN HCL IN DEXTROSE 1-5 GM/200ML-% IV SOLN
1000.0000 mg | Freq: Two times a day (BID) | INTRAVENOUS | Status: DC
  Administered 2015-06-19 – 2015-06-21 (×4): 1000 mg via INTRAVENOUS
  Filled 2015-06-19 (×5): qty 200
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
## 2015-06-19 NOTE — Progress Notes (Signed)
Patient refused CPT and tracheal suctioning at this time.
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
## 2015-06-19 NOTE — Progress Notes (Signed)
Physical Therapy Wound Treatment Patient Details  Name: Anthony Avila MRN: [id number] Date of Birth: [birth year]  Today's Date: 06/19/2015 Time: 08:56-09:35 am   Subjective  Subjective: Mouthing words appropriately. Given IV medicine and deep suctioned by RN prior to treatment. 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 white drainage along with blood with foul smelling odor, bone palpable (Active)  Dressing Type ABD;Moist to dry;Gauze (Comment);Tape dressing 06/19/2015  9:48 AM  Dressing Changed;Clean;Dry;Intact 06/19/2015  9:48 AM  Dressing Change Frequency Daily 06/19/2015  9:48 AM  State of Healing Early/partial granulation 06/19/2015  9:48 AM  Site / Wound Assessment Yellow;Red;Pink 06/19/2015  9:48 AM  % Wound base Red or Granulating 60% 06/19/2015  9:48 AM  % Wound base Yellow 40% 06/19/2015  9:48 AM  % Wound base Black 0% 06/17/2015  9:18 AM  % Wound base Other (Comment) 5% 06/17/2015  9:18 AM  Peri-wound Assessment Intact 06/19/2015  9:48 AM  Wound Length (cm) 9.3 cm 06/17/2015  9:18 AM  Wound Width (cm) 6.3 cm 06/17/2015  9:18 AM  Wound Depth (cm) 2.4 cm 06/17/2015  9:18 AM  Undermining (cm) 1.7 from 7:00 to 9:00 progressing to 2.5cm from 9:00 to 3:00 06/17/2015  9:18 AM  Margins Unattached edges (unapproximated) 06/19/2015  9:48 AM  Drainage Amount Moderate 06/19/2015  9:48 AM  Drainage Description Odor;Serosanguineous;Purulent 06/19/2015  9:48 AM  Treatment Hydrotherapy (Pulse lavage);Tape changed;Packing (Saline gauze) 06/19/2015  9:48 AM   Hydrotherapy Pulsed lavage therapy - wound location: sacrum Pulsed Lavage with Suction (psi): 8 psi (4-8 psi) Pulsed Lavage with Suction - Normal Saline Used: 1000 mL Pulsed Lavage Tip: Tip with  splash shield   Wound Assessment and Plan  Wound Therapy - Assess/Plan/Recommendations Wound Therapy - Clinical Statement: Pt stable enought to complete wound care session today. Desated into high 80's toward end of pulsed lavage with respiratory rate of 15-18 therefore did not perform selective debridement today. Once dressing applied and pt rolled to back vitals improved to sat's >/= 98% and respiratory rate back around 10. Pt calm at end of session and mouthing thank you to therapist. RN notifed of vital changes with treatment. Acute wound care to continue as pt is able to participate.                                                Wound Therapy - Functional Problem List: Decreased sitting due to pressure sore Factors Delaying/Impairing Wound Healing: Altered sensation;Incontinence;Immobility;Multiple medical problems;Polypharmacy Wound Therapy - Frequency: 6X / week Wound Therapy - Follow Up Recommendations: Other (comment) (LTACH) Wound Plan: See above  Wound Therapy Goals- Improve the function of patient's integumentary system by progressing the wound(s) through the phases of wound healing (inflammation - proliferation - remodeling) by: Decrease Necrotic Tissue to: 10 Decrease Necrotic Tissue - Progress: Progressing toward goal Increase Granulation Tissue to: 90 Increase Granulation Tissue - Progress: Progressing toward goal  Goals will be updated until maximal potential achieved or discharge criteria met.  Discharge criteria: when goals achieved, discharge from hospital, MD decision/surgical intervention, no progress towards goals, refusal/missing three consecutive treatments without notification or medical reason.  [name] 06/19/2015, 9:55 AM  [name], PTA, CLT Acute Rehab Services [phone] 06/19/15, 9:56 AM

## 2015-06-19 NOTE — Progress Notes (Signed)
BAL from 10-26 is pending. So far growing pseudomonas as previous. Await sensi of this. No change in cipro/gent. Cx is also showing coag negative staph.  Will add vanco for now, till we have sensi.  Will continue to follow.

## 2015-06-19 NOTE — Progress Notes (Signed)
Refused cpt

## 2015-06-20 ENCOUNTER — Inpatient Hospital Stay (HOSPITAL_COMMUNITY): Payer: Medicare Other

## 2015-06-20 DIAGNOSIS — I10 Essential (primary) hypertension: Secondary | ICD-10-CM

## 2015-06-20 DIAGNOSIS — I8291 Chronic embolism and thrombosis of unspecified vein: Secondary | ICD-10-CM

## 2015-06-20 LAB — GLUCOSE, CAPILLARY
GLUCOSE-CAPILLARY: 174 mg/dL — AB (ref 65–99)
GLUCOSE-CAPILLARY: 163 mg/dL — AB (ref 65–99)
Glucose-Capillary: 158 mg/dL — ABNORMAL HIGH (ref 65–99)
Glucose-Capillary: 182 mg/dL — ABNORMAL HIGH (ref 65–99)
Glucose-Capillary: 182 mg/dL — ABNORMAL HIGH (ref 65–99)
Glucose-Capillary: 187 mg/dL — ABNORMAL HIGH (ref 65–99)

## 2015-06-20 MED ORDER — PERFLUTREN LIPID MICROSPHERE
INTRAVENOUS | Status: AC
  Administered 2015-06-20: 1 mL
  Filled 2015-06-20: qty 10

## 2015-06-20 NOTE — Progress Notes (Signed)
No CPT at this time due to tremendous anxiety and pt claiming to be awakened by feelings of not being able to breathe.  Possibly anxiety causing the periods of SOB.  RT will continue to monitor, and possibly continue CPT when pt is more calm.
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
## 2015-06-20 NOTE — Progress Notes (Signed)
*  PRELIMINARY RESULTS* Echocardiogram 2D Echocardiogram with definity has been performed.  Jeryl Columbialliott, Arless Vineyard 06/20/2015, 11:09 AM

## 2015-06-20 NOTE — Progress Notes (Signed)
CRITICAL VALUE ALERT  Critical value received:  BAL: Abundant Pseudomonas  Date of notification:  06/20/2015   Time of notification:  9:27 AM   Critical value read back:yes  Nurse who received alert: Gerald Dexterata Ianmichael Amescua, RN  MD notified (1st page):  Dr. Arbutus Leasat  Time of first page: [phone]  MD notified (2nd page):  Time of second page:  Responding MD:    Time MD responded:

## 2015-06-21 DIAGNOSIS — Z8619 Personal history of other infectious and parasitic diseases: Secondary | ICD-10-CM

## 2015-06-21 DIAGNOSIS — Z1624 Resistance to multiple antibiotics: Secondary | ICD-10-CM

## 2015-06-21 DIAGNOSIS — Z21 Asymptomatic human immunodeficiency virus [HIV] infection status: Secondary | ICD-10-CM

## 2015-06-21 DIAGNOSIS — A498 Other bacterial infections of unspecified site: Secondary | ICD-10-CM | POA: Insufficient documentation

## 2015-06-21 LAB — BASIC METABOLIC PANEL
Anion gap: 11 (ref 5–15)
BUN: 23 mg/dL — AB (ref 6–20)
CHLORIDE: 93 mmol/L — AB (ref 101–111)
CO2: 34 mmol/L — ABNORMAL HIGH (ref 22–32)
CREATININE: 0.7 mg/dL (ref 0.61–1.24)
Calcium: 9.3 mg/dL (ref 8.9–10.3)
GFR calc Af Amer: >60 mL/min (ref >60)
Glucose, Bld: 164 mg/dL — ABNORMAL HIGH (ref 65–99)
Potassium: 3.9 mmol/L (ref 3.5–5.1)
SODIUM: 138 mmol/L (ref 135–145)

## 2015-06-21 LAB — CBC
HCT: 28.5 % — ABNORMAL LOW (ref 39.0–52.0)
HEMOGLOBIN: 8.4 g/dL — AB (ref 13.0–17.0)
MCH: 25.8 pg — AB (ref 26.0–34.0)
MCHC: 29.5 g/dL — AB (ref 30.0–36.0)
MCV: 87.4 fL (ref 78.0–100.0)
Platelets: 225 10*3/uL (ref 150–400)
RBC: 3.26 MIL/uL — ABNORMAL LOW (ref 4.22–5.81)
RDW: 17.3 % — ABNORMAL HIGH (ref 11.5–15.5)
WBC: 8.1 10*3/uL (ref 4.0–10.5)

## 2015-06-21 LAB — GLUCOSE, CAPILLARY
GLUCOSE-CAPILLARY: 128 mg/dL — AB (ref 65–99)
GLUCOSE-CAPILLARY: 162 mg/dL — AB (ref 65–99)
GLUCOSE-CAPILLARY: 190 mg/dL — AB (ref 65–99)
Glucose-Capillary: 138 mg/dL — ABNORMAL HIGH (ref 65–99)
Glucose-Capillary: 159 mg/dL — ABNORMAL HIGH (ref 65–99)
Glucose-Capillary: 193 mg/dL — ABNORMAL HIGH (ref 65–99)

## 2015-06-21 LAB — CULTURE, RESPIRATORY W GRAM STAIN

## 2015-06-21 LAB — GENTAMICIN LEVEL, TROUGH: Gentamicin Trough: 1.4 ug/mL (ref 0.5–2.0)

## 2015-06-21 LAB — FOLATE RBC
Folate, Hemolysate: 550.6 ng/mL
Folate, RBC: 2078 ng/mL (ref >498)
Hematocrit: 26.5 % — ABNORMAL LOW (ref 37.5–51.0)

## 2015-06-21 LAB — CULTURE, RESPIRATORY

## 2015-06-21 LAB — MAGNESIUM: MAGNESIUM: 2.2 mg/dL (ref 1.7–2.4)

## 2015-06-21 LAB — PHOSPHORUS: Phosphorus: 4.7 mg/dL — ABNORMAL HIGH (ref 2.5–4.6)

## 2015-06-21 MED ORDER — ALBUTEROL SULFATE (2.5 MG/3ML) 0.083% IN NEBU
2.5000 mg | INHALATION_SOLUTION | Freq: Four times a day (QID) | RESPIRATORY_TRACT | Status: DC
  Administered 2015-06-21 – 2015-06-27 (×26): 2.5 mg via RESPIRATORY_TRACT
  Filled 2015-06-21 (×26): qty 3

## 2015-06-21 MED ORDER — LEVOFLOXACIN IN D5W 750 MG/150ML IV SOLN
750.0000 mg | INTRAVENOUS | Status: DC
  Administered 2015-06-21 – 2015-06-27 (×7): 750 mg via INTRAVENOUS
  Filled 2015-06-21 (×8): qty 150

## 2015-06-21 MED ORDER — GENTAMICIN SULFATE 40 MG/ML IJ SOLN
7.0000 mg/kg | INTRAVENOUS | Status: DC
  Administered 2015-06-23: 600 mg via INTRAVENOUS
  Filled 2015-06-21: qty 15
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
## 2015-06-21 NOTE — Progress Notes (Signed)
CPT not available at this time; vest not in room. Patient had been refusing treatment and RT attempted to explain the importance of CPT. Patient agrees to try it at afternoon check. RT will continue to monitor patient.

## 2015-06-21 NOTE — Progress Notes (Signed)
Physical Therapy Wound Treatment Patient Details  Name: Anthony Avila MRN: [id number] Date of Birth: 04/13/1946  Today's Date: 06/21/2015 Time: 1055-1123 Time Calculation (min): 28 min  Subjective  Subjective: Mouthing, "Do it quick." Patient and Family Stated Goals: Not stated due to trach/vent Prior Treatments: Dressing change  Pain Score: Pain Score: Denies pain. Premedicated.  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 white drainage along with blood with foul smelling odor, bone palpable (Active)  Dressing Type ABD;Moist to dry;Gauze (Comment);Tape dressing 06/21/2015  1:00 PM  Dressing Changed;Clean;Dry;Intact 06/21/2015  1:00 PM  Dressing Change Frequency Daily 06/21/2015  1:00 PM  State of Healing Early/partial granulation 06/21/2015  1:00 PM  Site / Wound Assessment Yellow;Red;Pink 06/21/2015  1:00 PM  % Wound base Red or Granulating 80% 06/21/2015  1:00 PM  % Wound base Yellow 20% 06/21/2015  1:00 PM  % Wound base Black 0% 06/21/2015  1:00 PM  % Wound base Other (Comment) 0% 06/21/2015  1:00 PM  Peri-wound Assessment Intact 06/21/2015  1:00 PM  Wound Length (cm) 9.3 cm 06/17/2015  9:18 AM  Wound Width (cm) 6.3 cm 06/17/2015  9:18 AM  Wound Depth (cm) 2.4 cm 06/17/2015  9:18 AM  Undermining (cm) 1.7 from 7:00 to 9:00 progressing to 2.5cm from 9:00 to 3:00 06/17/2015  9:18 AM  Margins Unattached edges (unapproximated) 06/21/2015  1:00 PM  Drainage Amount Moderate 06/21/2015  1:00 PM  Drainage Description Serosanguineous 06/21/2015  1:00 PM  Treatment Debridement (Selective);Hydrotherapy (Pulse lavage);Packing (Saline gauze) 06/21/2015  1:00 PM  Santyl applied to wound bed prior to applying dressing.  Hydrotherapy Pulsed lavage therapy - wound location: sacrum Pulsed Lavage with Suction (psi): 8 psi (4-8 psi) Pulsed Lavage with Suction - Normal Saline Used: 1000 mL Pulsed Lavage Tip: Tip with splash  shield   Wound Assessment and Plan  Wound Therapy - Assess/Plan/Recommendations Wound Therapy - Clinical Statement: Wound looks significantly better with much less necrotic tissue. Pulsatile lavage and dressing change performed rather quickly to minimize time on his side. Pt on side ~5 minutes and SaO2 decr to 82%. Treatment completed and pt returned to supine with SaO2 returning to 94%. Respiratory in to suction. Wound Therapy - Functional Problem List: Decreased sitting due to pressure sore Factors Delaying/Impairing Wound Healing: Altered sensation;Incontinence;Immobility;Multiple medical problems;Polypharmacy Wound Therapy - Frequency: 6X / week Wound Therapy - Follow Up Recommendations: Skilled nursing facility Wound Plan: See above  Wound Therapy Goals- Improve the function of patient's integumentary system by progressing the wound(s) through the phases of wound healing (inflammation - proliferation - remodeling) by: Decrease Necrotic Tissue to: 10 Decrease Necrotic Tissue - Progress: Progressing toward goal Increase Granulation Tissue to: 90 Increase Granulation Tissue - Progress: Progressing toward goal  Goals will be updated until maximal potential achieved or discharge criteria met.  Discharge criteria: when goals achieved, discharge from hospital, MD decision/surgical intervention, no progress towards goals, refusal/missing three consecutive treatments without notification or medical reason.  GP     , 06/21/2015, 1:32 PM   PT [phone]    

## 2015-06-21 NOTE — Progress Notes (Signed)
Daily Progress Note   Patient Name: Anthony Avila       Date: 06/21/2015 DOB: [birth year]  Age: 69 y.o. MRN#: [id number] Attending Physician: Orson Eva, MD Primary Care Physician: Verneita Griffes, MD Admit Date: 05/23/2015  Reason for Consultation/Follow-up: Establishing goals of care, Non pain symptom management and Pain control  Subjective:     I met again with Mr. Councilman today and he is more open to discussion with me. I explained our concern with his resistant lung infection and the fact that he has not really made any progress and fear that weaning off the vent may not happen - I think he has had some time now to see his lack of progress for himself. He did ask about prognosis at this point. I expressed concern that due to these issues and the fact that he is prone to recurrent infections (i.e. lung, bladder, wounds) he will likely have a very hard road in front of him and that we will need his help to recognize when he does not have QOL or when this becomes too much for him - prognosis very much depends on how aggressive he wishes to continue (recognizing also that medicine has its limits on what we can fix and reverse). He seems to understand this concept. We also addressed CPR and if the thought that this may not get much better for him - is CPR something he would want for himself. He needs time to think about all these concepts and I told him that nothing has to be decided right now but I will come back tomorrow to see what his thoughts are. He says that he is not worried or scared about anything at this time.   This is the first conversation that Mr. Postle has been open to discussion and even acknowledging that he may have very poor prognosis and QOL. I will follow up on this conversation tomorrow. He told me that his hope was to see his grandchildren grow up but he said he doesn't think that is going to happen. Also noted that son to come Wednesday. Mr. Mazzocco sister was present at the beginning  of the conversation but Mr. Mey asked her to step out of the room.    Length of Stay: 29 days  Current Medications: Scheduled Meds:  . albuterol  2.5 mg Nebulization Q6H  . antiseptic oral rinse  7 mL Mouth Rinse QID  . ascorbic acid  500 mg Per Tube Daily  . baclofen  10 mg Per Tube Q6H  . chlorhexidine gluconate  15 mL Mouth Rinse BID  . ciprofloxacin  400 mg Intravenous Q8H  . collagenase   Topical Daily  . dolutegravir  50 mg Oral Daily  . DULoxetine  30 mg Oral Daily  . emtricitabine-tenofovir AF  1 tablet Oral Daily  . fentaNYL  100 mcg Transdermal Q72H  . gentamicin  7 mg/kg Intravenous Q36H  . heparin subcutaneous  5,000 Units Subcutaneous 3 times per day  . insulin aspart  0-15 Units Subcutaneous 6 times per day  . insulin glargine  15 Units Subcutaneous QHS  . multivitamin  5 mL Per Tube Daily  . pantoprazole sodium  40 mg Per Tube Q24H  . polyethylene glycol  17 g Oral Daily  . propantheline  15 mg Oral TID WC & HS  . QUEtiapine  25 mg Oral QHS  . sennosides  10 mL Per Tube BID    Continuous Infusions: . sodium chloride 250 mL (06/20/15  0440)  . feeding supplement (VITAL AF 1.2 CAL) 1,000 mL (06/21/15 0729)    PRN Meds: acetaminophen, albuterol, ALPRAZolam, bisacodyl, fentaNYL (SUBLIMAZE) injection, hydrALAZINE, iohexol, ondansetron (ZOFRAN) IV, phenol, zolpidem  Palliative Performance Scale: 20%     Vital Signs: BP 139/67 mmHg  Pulse 57  Temp(Src) 98.2 F (36.8 C) (Oral)  Resp 12  Ht 6' 2"  (1.88 m)  Wt 81.194 kg (179 lb)  BMI 22.97 kg/m2  SpO2 98% SpO2: SpO2: 98 % O2 Device: O2 Device: Ventilator O2 Flow Rate:    Intake/output summary:   Intake/Output Summary (Last 24 hours) at 06/21/15 1417 Last data filed at 06/21/15 1300  Gross per 24 hour  Intake   1300 ml  Output   4700 ml  Net  -3400 ml   LBM: Last BM Date: 06/20/15 Baseline Weight: Weight: 76.658 kg (169 lb) Most recent weight: Weight: 81.194 kg (179 lb)  Physical  Exam: General: Lying in bed HEENT: Trach to vent CVS: Loletha Grayer Resp: Vent, no tachypnea Abd: Soft, ND Neuro: Awake, alert, oriented x3    Additional Data Reviewed: Recent Labs     06/19/15  0355  06/21/15  0632  WBC   --   8.1  HGB   --   8.4*  PLT   --   225  NA  137  138  BUN  23*  23*  CREATININE  0.58*  0.70     Problem List:  Patient Active Problem List   Diagnosis Date Noted  . 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 Pseudomonas aeruginosa infection   . Acute on chronic respiratory failure with hypoxia ([place]) 06/16/2015  . VAP (ventilator-associated pneumonia) ([place])   . Lung collapse   . History of MDR Pseudomonas aeruginosa infection   . Acute respiratory failure ([place])   . Neck pain   . Ventilator dependence ([place])   . Respiratory distress   . UTI (lower urinary tract infection)   . Anemia of chronic disease   . HIV (human immunodeficiency virus infection) ([place])   . Palliative care encounter   . Acute neck pain   . 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 Atelectasis   . Respiratory failure ([place])   . Apnea   . Arm swelling   . Hypoxia   . Leaking PEG tube ([place])   . Pulmonary edema   . HCAP (healthcare-associated pneumonia) 05/23/2015  . Pressure ulcer 05/23/2015     Palliative Care Assessment & Plan    Code Status:  Full code  Goals of Care:  Full aggressive care. He is considering what his goals are for himself.   Desire for further Chaplaincy support:no  3. Symptom Management:  Pain: Continue fentanyl 100 mcg every 2 hours prn. Fentanyl patch 100 mcg every 72 hrs.   Anxiety: Continue xanax prn.   4. Palliative Prophylaxis:  Turn/reposition, bowel regimen, aspiration precautions  5. Prognosis: Poor prognosis with likelihood of recurrent/continuing infection and recurrent hospitalizations.   5. Discharge Planning: Will need vent SNF although they have not been happy with [facility]. Unfortunately options  are very few for the amount of care he will require.   Thank you for allowing the Palliative Medicine Team to assist in the care of this patient.   Time In: 1330 Time Out: 1410 Total Time 41mn Prolonged Time Billed  no     Greater than 50%  of this time was spent counseling and coordinating care related to the above assessment and plan.  Vinie Sill, NP Palliative Medicine Team Pager # [phone] (M-F 8a-5p) Team Phone # [phone] (Nights/Weekends)  06/21/2015, 2:17 PM

## 2015-06-21 NOTE — Progress Notes (Signed)
ANTIBIOTIC CONSULT NOTE - FOLLOW UP  Pharmacy Consult for Gentamycin Indication: pneumonia  No Known Allergies  Patient Measurements: Height: 6\' 2"  (188 cm) Weight: 179 lb (81.194 kg) IBW/kg (Calculated) : 82.2   Vital Signs: Temp: 98.2 F (36.8 C) (10/31 1257) Temp Source: Oral (10/31 1257) BP: 130/63 mmHg (10/31 1520) Pulse Rate: 57 (10/31 1931) Intake/Output from previous day: 10/30 0701 - 10/31 0700 In: 2300 [NG/GT:1425; IV Piggyback:800] Out: 4100 [Urine:3650; Stool:450] Intake/Output from this shift:   Labs:  Recent Labs  06/19/15 0355 06/21/15 0632  WBC  --  8.1  HGB  --  8.4*  PLT  --  225  CREATININE 0.58* 0.70  Estimated CrCl:  <  60 ml/min   Infectious disease:  ABX:  Primaxin 10/11>> 10/25  Vanc 10/2>> 10/5, 10/11>>10/13, 10/29>>10/31  Zosyn 10/2>>10/4  Cipro IV 10/4>>10/7  Mero 10/25 >> 10/28  Gent 10/25 >> (11/10)  --10/26 10 hr gent random = 7  Cipro 10/28 >> 10/31  Levaquin 10/31>>(11/10)    CX data: 10/2 Blood x 2 - Coag neg Staph x 1, likely contaminant; 2nd cx ngtd  10/2 urine - > 100 K/ml Pseudomonas, sens Cefepime, Fortaz, Cipro, Gent < 1, Imi. Should also be sens to Zosyn though not reported  10/2 MRSA pcr negative  10/11: Trach aspirate: pseudomonas aeruginosa I to cefepime and R to Ceftaz, S to all other agents  10/11 blood: ngf  10/22 BCx2: neg  10/25 TA>>mod pseudomonas (R-Imi/Ceftaz, I-Cefepime, S-Cipro/Gent/Tobra)  10/26 BAL: Pseudomonas, S Cipro, Gent/Tobra; stenotrophomonas- S LVQ and Bactrim   Assessment: [name] with VAP secondary to MDR Pseudomonas and Stenotrophomonas in setting of HIV on Tivicay and Descovy and quadriplegia with trach and peg.  10/31: Gentamycin trough 1.4 drawn correctly on dose of 600 mg q 48H  Goal of Therapy:  Treatment of infection Gentamycin level < 1   Plan:  - adjust gentamycin dose to 600 mg q48H starting 11/02 as already received dose today - ? Level after next dose vs getting  peak/trough for pt specific kinetics - Following daily    Pollyann SamplesAndy Daveigh Batty, PharmD, BCPS 06/21/2015, 9:03 PM Pager: [phone]

## 2015-06-21 NOTE — Progress Notes (Addendum)
CSW spoke with pt son concerning placement.  CSW reiterated that at this time only [facility] is able to accept and in search by previous CSW pt was unable to be admitted elsewhere due to complex medical condition (expensive meds/ hydrotherapy etc).  Pt son expressed understanding- would like to meet with physicians to discuss pt condition.  Pt son states he will be at the hospital all day Wednesday.  CSW informed pt physicians that pt son will be at bedside and would like to discuss pt care.  CSW will continue to follow.  Merlyn LotJenna Holoman, LCSWA Clinical Social Worker [phone]
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
## 2015-06-22 LAB — GLUCOSE, CAPILLARY
GLUCOSE-CAPILLARY: 148 mg/dL — AB (ref 65–99)
GLUCOSE-CAPILLARY: 179 mg/dL — AB (ref 65–99)
GLUCOSE-CAPILLARY: 163 mg/dL — AB (ref 65–99)
GLUCOSE-CAPILLARY: 144 mg/dL — AB (ref 65–99)
Glucose-Capillary: 136 mg/dL — ABNORMAL HIGH (ref 65–99)
Glucose-Capillary: 173 mg/dL — ABNORMAL HIGH (ref 65–99)

## 2015-06-22 LAB — BASIC METABOLIC PANEL
ANION GAP: 11 (ref 5–15)
BUN: 22 mg/dL — ABNORMAL HIGH (ref 6–20)
CHLORIDE: 90 mmol/L — AB (ref 101–111)
CO2: 33 mmol/L — AB (ref 22–32)
Calcium: 8.9 mg/dL (ref 8.9–10.3)
Creatinine, Ser: 0.76 mg/dL (ref 0.61–1.24)
GFR calc non Af Amer: >60 mL/min (ref >60)
Glucose, Bld: 176 mg/dL — ABNORMAL HIGH (ref 65–99)
Potassium: 3.9 mmol/L (ref 3.5–5.1)
Sodium: 134 mmol/L — ABNORMAL LOW (ref 135–145)

## 2015-06-22 LAB — URINALYSIS, ROUTINE W REFLEX MICROSCOPIC
BILIRUBIN URINE: NEGATIVE
Glucose, UA: NEGATIVE mg/dL
KETONES UR: NEGATIVE mg/dL
Leukocytes, UA: NEGATIVE
NITRITE: NEGATIVE
Protein, ur: NEGATIVE mg/dL
Specific Gravity, Urine: 1.007 (ref 1.005–1.030)
Urobilinogen, UA: 0.2 mg/dL (ref 0.0–1.0)
pH: 8 (ref 5.0–8.0)

## 2015-06-22 LAB — URINE MICROSCOPIC-ADD ON

## 2015-06-22 MED ORDER — INSULIN GLARGINE 100 UNIT/ML ~~LOC~~ SOLN
18.0000 [IU] | Freq: Every day | SUBCUTANEOUS | Status: DC
  Administered 2015-06-22 – 2015-06-27 (×6): 18 [IU] via SUBCUTANEOUS
  Filled 2015-06-22 (×7): qty 0.18

## 2015-06-22 MED ORDER — QUETIAPINE FUMARATE 50 MG PO TABS
50.0000 mg | ORAL_TABLET | Freq: Every day | ORAL | Status: DC
  Administered 2015-06-22 – 2015-06-27 (×6): 50 mg via ORAL
  Filled 2015-06-22 (×6): qty 1

## 2015-06-22 NOTE — Procedures (Signed)
Pt refuses CPT 
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
## 2015-06-22 NOTE — Progress Notes (Signed)
Physical Therapy Wound Treatment/Discharge Patient Details  Name: Anthony Avila MRN: [id number] Date of Birth: 01/28/1946  Today's Date: 06/22/2015 Time: 1039-1113 Time Calculation (min): 34 min  Subjective  Subjective: Mouthing, "I wouldn't do this to another human being." Pt referring to all the medical interventions. Patient and Family Stated Goals: Not stated due to trach/vent Prior Treatments: Dressing change  Pain Score:  Pt premedicated with meds.  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 white drainage along with blood with foul smelling odor, bone palpable (Active)  Dressing Type ABD;Moist to dry;Gauze (Comment);Tape dressing 06/22/2015  3:45 PM  Dressing Changed;Clean;Dry;Intact 06/22/2015  3:45 PM  Dressing Change Frequency Daily 06/22/2015  3:45 PM  State of Healing Early/partial granulation 06/22/2015  3:45 PM  Site / Wound Assessment Yellow;Red;Pink 06/22/2015  3:45 PM  % Wound base Red or Granulating 80% 06/22/2015  3:45 PM  % Wound base Yellow 20% 06/22/2015  3:45 PM  % Wound base Black 0% 06/22/2015  3:45 PM  % Wound base Other (Comment) 0% 06/22/2015  3:45 PM  Peri-wound Assessment Intact 06/22/2015  3:45 PM  Wound Length (cm) 8.5 cm 06/21/2015  1:00 PM  Wound Width (cm) 6.3 cm 06/21/2015  1:00 PM  Wound Depth (cm) 2.2 cm 06/21/2015  1:00 PM  Undermining (cm) 2.5 cm from 9-3 o'clock 06/21/2015  1:00 PM  Margins Unattached edges (unapproximated) 06/22/2015  3:45 PM  Drainage Amount Moderate 06/22/2015  3:45 PM  Drainage Description Serosanguineous 06/22/2015  3:45 PM  Treatment Hydrotherapy (Pulse lavage);Packing (Saline gauze) 06/22/2015  3:45 PM   Hydrotherapy Pulsed lavage therapy - wound location: sacrum Pulsed Lavage with Suction (psi): 8 psi (4-8 psi) Pulsed Lavage with Suction - Normal Saline Used: 1000 mL Pulsed Lavage Tip: Tip with splash shield   Wound Assessment and Plan  Wound Therapy -  Assess/Plan/Recommendations Wound Therapy - Clinical Statement: Wound looks good and hydrotherapy no longer needed. Consulted with WOC RN who is in agreement. Pt to continue to have dressing changes. Wound Therapy - Functional Problem List: Decreased sitting due to pressure sore Factors Delaying/Impairing Wound Healing: Altered sensation;Incontinence;Immobility;Multiple medical problems;Polypharmacy Wound Therapy - Frequency: 6X / week Wound Therapy - Follow Up Recommendations: Skilled nursing facility Wound Plan: See above  Wound Therapy Goals- Improve the function of patient's integumentary system by progressing the wound(s) through the phases of wound healing (inflammation - proliferation - remodeling) by: Decrease Necrotic Tissue to: 10 Decrease Necrotic Tissue - Progress: Partly met Increase Granulation Tissue to: 90 Increase Granulation Tissue - Progress: Partly met  Goals will be updated until maximal potential achieved or discharge criteria met.  Discharge criteria: when goals achieved, discharge from hospital, MD decision/surgical intervention, no progress towards goals, refusal/missing three consecutive treatments without notification or medical reason.  GP     , 06/22/2015, 4:01 PM   PT [phone]    

## 2015-06-22 NOTE — Consult Note (Signed)
WOC follow-up: Assessed sacrum wound while physical therapy was performing hydrotherapy.  Wound is fairly clean; 80% red, 20% yellow in patchy areas which are adhered to bone. 8.5X6.3X2.2cm, with undermining from 9:00 o'clock to 3:00 o'clock to a depth of 2.5 cm. Small amt green drainage, no odor. Pt states "I don't want all this done to me anymore."  Encouraged him to share his wishes with his family members when they visit. Discussed plan of care with PT; do not feel that further benefits can be obtained from hydrotherapy.  Will discontinue this treatment and bedside nurses can continue daily dressings with Santyl for chemical debridement.  Pt on an air mattress to reduce pressure.   Please re-consult if further assistance is needed.  Thank-you,  Cammie Mcgeeawn Loralai Eisman MSN, RN, CWOCN, [place], CNS [phone]
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
## 2015-06-22 NOTE — Progress Notes (Signed)
Patient noted with some visual hallucinations. Patient keep insisting that there are people in his room standing behind the bed and at the window. I turned the patient bed so he see the window and sat him on the side of the bed with another staff member so he could show me the people he insisted were present in his room. Text page sent to oncall [facility] provider with no call back. Patient VSS through  the night. Patient continue to asked If his sister was siting in the chair next to him and persistent that a family member was present when they clearly was not. [facility]. Endorse the Cabin crewoncoming nurse.

## 2015-06-22 NOTE — Progress Notes (Signed)
Mr. Anthony Avila is sleepy today and only tells me that he is in pain and he just wants to rest now. Does not share any further thoughts about our conversation yesterday. I will follow up tomorrow and try and speak with his son if he allows.  Yong ChannelAlicia Lashuna Tamashiro, NP Palliative Medicine Team Pager # [phone] (M-F 8a-5p) Team Phone # [phone] (Nights/Weekends)

## 2015-06-23 ENCOUNTER — Inpatient Hospital Stay (HOSPITAL_COMMUNITY): Payer: Medicare Other

## 2015-06-23 LAB — GLUCOSE, CAPILLARY
GLUCOSE-CAPILLARY: 114 mg/dL — AB (ref 65–99)
GLUCOSE-CAPILLARY: 153 mg/dL — AB (ref 65–99)
GLUCOSE-CAPILLARY: 200 mg/dL — AB (ref 65–99)
GLUCOSE-CAPILLARY: 156 mg/dL — AB (ref 65–99)
Glucose-Capillary: 133 mg/dL — ABNORMAL HIGH (ref 65–99)
Glucose-Capillary: 86 mg/dL (ref 65–99)
Glucose-Capillary: 133 mg/dL — ABNORMAL HIGH (ref 65–99)

## 2015-06-23 LAB — URINE CULTURE: CULTURE: NO GROWTH

## 2015-06-23 LAB — GENTAMICIN LEVEL, TROUGH: Gentamicin Trough: 0.8 ug/mL (ref 0.5–2.0)

## 2015-06-23 MED ORDER — WHITE PETROLATUM GEL
Status: AC
  Administered 2015-06-23: 0.2
  Filled 2015-06-23: qty 1

## 2015-06-23 NOTE — Progress Notes (Signed)
Patient's son, [name], is here to visit the patient.

## 2015-06-23 NOTE — Progress Notes (Signed)
Utilization Review Completed.  

## 2015-06-23 NOTE — Progress Notes (Signed)
Patient refuses suctioning and CPT.  LS clear/diminished with no indication for suctioning present.  Respiratory will continue to monitor and offer treatment as indicated.
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
## 2015-06-23 NOTE — Progress Notes (Signed)
Family Discussion:  I had a lengthy discussion with the patient, his son [name], Palliative Care, and Social Worker who are at bedside. We discussed the patient's multiple medical problems including his Pseudomonas UTI, Pseudomonas pneumonia, Stenotrophomonas pneumonia, skin breakdown causing stage IV decubiti, paralysis, and ventilator dependence. I explained that with continued dependence on ventilator support his respiratory muscles will atrophy further making liberation from the ventilator less likely. I also explained that with repeated infection from different and more resistant organisms his recovery trajectory & possibility of recover are not favorable. I explained it in these situations often patients develop recurrent and more drug-resistant infections that we may be unable to successfully treat in the future. Ultimately we cannot correct his paralysis. I explained that his father has been refusing chest physiotherapy and with underlying delirium we have not been forcing the patient to undergo this. The patient's son expressed that he was okay with us forcing his father to undergo chest physiotherapy in setting of delirium. The patient himself denied in front of his son ever refusing chest physiotherapy. The patient attempt to ask further questions but it was not clear what he was trying to communicate. The patient questioned me on his long-term prognosis and I explained that given history trajectory I do not expect him to live years. However, neither I nor anyone else can provide him with a definite long-term prognosis or expected lifespan. This was again communicated to the patient's son prior to my departure from the conversation. I explained that while we and every other medical facility try to eliminate and prevent infections  unfortunately they are complication of agressive care & the environment in which he lives now.  Donna ChristenJennings E. Jamison NeighborNestor, M.D.  Pulmonary & Critical Care Pager:   [phone] After 3pm or if no response, call [phone]

## 2015-06-23 NOTE — Progress Notes (Signed)
Respiratory requested to assist speech therapy with Anthony HutchingPassey Muir Valve trial.  Patient placed on in line PMV, while PEEP decreased to 3 and PSV titrated to maintain adequate volume.  Patient able to speak, as well as spontaneously produce white sputum through his mouth.  No desaturation, tachycardia, or tachypnea noted during episode.  Trial ended at patient request, however, patient voiced desire to continue PMV trials at a later time.  Returned to previous resting vent settings.  Respiratory will continue to monitor patient.
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
## 2015-06-23 NOTE — Progress Notes (Addendum)
ANTIBIOTIC CONSULT NOTE - FOLLOW UP  Pharmacy Consult for Gentamicin Indication: pneumonia  No Known Allergies  Patient Measurements: Height: 6\' 2"  (188 cm) Weight: 183 lb (83.008 kg) IBW/kg (Calculated) : 82.2   Vital Signs: Temp: 97.4 F (36.3 C) (11/02 2000) Temp Source: Oral (11/02 2000) BP: 126/57 mmHg (11/02 2001) Pulse Rate: 58 (11/02 2001) Intake/Output from previous day: 11/01 0701 - 11/02 0700 In: 2640 [P.O.:480; I.V.:240; NG/GT:1800] Out: 3225 [Urine:3225] Intake/Output from this shift:   Labs:  Recent Labs  06/21/15 0632 06/22/15 0312  WBC 8.1  --   HGB 8.4*  --   PLT 225  --   CREATININE 0.70 0.76  Estimated CrCl:  <  60 ml/min   Infectious disease:  ABX:  Primaxin 10/11>> 10/25  Vanc 10/2>> 10/5, 10/11>>10/13, 10/29>>10/31  Zosyn 10/2>>10/4  Cipro IV 10/4>>10/7  Mero 10/25 >> 10/28  Gent 10/25 >> (11/10)  --10/26 10 hr gent random = 7  Cipro 10/28 >> 10/31  Levaquin 10/31>>(11/10)    CX data: 10/2 Blood x 2 - Coag neg Staph x 1, likely contaminant; 2nd cx ngtd  10/2 urine - > 100 K/ml Pseudomonas, sens Cefepime, Fortaz, Cipro, Gent < 1, Imi. Should also be sens to Zosyn though not reported  10/2 MRSA pcr negative  10/11: Trach aspirate: pseudomonas aeruginosa I to cefepime and R to Ceftaz, S to all other agents  10/11 blood: ngf  10/22 BCx2: neg  10/25 TA>>mod pseudomonas (R-Imi/Ceftaz, I-Cefepime, S-Cipro/Gent/Tobra)  10/26 BAL: Pseudomonas, S Cipro, Gent/Tobra; stenotrophomonas- S LVQ and Bactrim   Assessment: [name] with VAP secondary to MDR Pseudomonas and Stenotrophomonas in setting of HIV on Tivicay and Descovy and quadriplegia with trach and peg.  Previously, on 10/31 the gentamicin trough was 1.4 drawn correctly on dose of 600 mg q 36 hours.  Then gentamicin dose was reduced to 600 mg IV q48h.  Tonight the gent trough is 0.8 mcg/ml, which is at goal of < 551mcg/ml.    Goal of Therapy:  Treatment of infection Gentamycin trough  level < 1   Plan:  Continue IV Gentamicin 600 mg q48H  Recheck steady state gentamicin trough on 11/4 or 11/6 if gentamicin continued.  Following daily   Thank you for allowing pharmacy to be part of this patients care team. Anthony Avila, RPh Clinical Pharmacist Pager: [phone] 06/23/2015, 8:48 PM

## 2015-06-23 NOTE — Progress Notes (Signed)
CSW, palliative physician, and  CCM MD met with pt and son at bedside to discuss pt care.  Physicians explained pt condition and expected prognosis and answered sons questions.  CSW then spoke with pt and son about placement options- explained that [facility] is the only local option for Vent SNF.  CSW made referral to [facility] and [place] facilities as well- at this time [facility] [place] is offering a bed- [facility] is unwilling to take pt due to high cost of medications.  Pt son asked for CSW to refer pt to [facility] in [place] made referral but explained to son that when pt is medically stable he will have to go to whatever facility is available (including [facility])- pt son expresses understanding.  CSW will continue to follow.  Domenica Reamer, [place] Social Worker [phone]

## 2015-06-24 DIAGNOSIS — R410 Disorientation, unspecified: Secondary | ICD-10-CM

## 2015-06-24 DIAGNOSIS — A498 Other bacterial infections of unspecified site: Secondary | ICD-10-CM

## 2015-06-24 LAB — BASIC METABOLIC PANEL
Anion gap: 7 (ref 5–15)
BUN: 22 mg/dL — AB (ref 6–20)
CO2: 34 mmol/L — AB (ref 22–32)
CREATININE: 0.63 mg/dL (ref 0.61–1.24)
Calcium: 9.4 mg/dL (ref 8.9–10.3)
Chloride: 100 mmol/L — ABNORMAL LOW (ref 101–111)
GFR calc non Af Amer: >60 mL/min (ref >60)
Glucose, Bld: 164 mg/dL — ABNORMAL HIGH (ref 65–99)
Potassium: 4 mmol/L (ref 3.5–5.1)
SODIUM: 141 mmol/L (ref 135–145)

## 2015-06-24 LAB — GLUCOSE, CAPILLARY
GLUCOSE-CAPILLARY: 102 mg/dL — AB (ref 65–99)
Glucose-Capillary: 170 mg/dL — ABNORMAL HIGH (ref 65–99)
Glucose-Capillary: 161 mg/dL — ABNORMAL HIGH (ref 65–99)
Glucose-Capillary: 162 mg/dL — ABNORMAL HIGH (ref 65–99)
Glucose-Capillary: 145 mg/dL — ABNORMAL HIGH (ref 65–99)

## 2015-06-24 LAB — CBC
HEMATOCRIT: 26.4 % — AB (ref 39.0–52.0)
Hemoglobin: 8.1 g/dL — ABNORMAL LOW (ref 13.0–17.0)
MCH: 26.8 pg (ref 26.0–34.0)
MCHC: 30.7 g/dL (ref 30.0–36.0)
MCV: 87.4 fL (ref 78.0–100.0)
Platelets: 293 10*3/uL (ref 150–400)
RBC: 3.02 MIL/uL — ABNORMAL LOW (ref 4.22–5.81)
RDW: 17.5 % — AB (ref 11.5–15.5)
WBC: 6 10*3/uL (ref 4.0–10.5)

## 2015-06-24 MED ORDER — GENTAMICIN SULFATE 40 MG/ML IJ SOLN
7.0000 mg/kg | INTRAVENOUS | Status: DC
  Administered 2015-06-25 – 2015-06-27 (×2): 600 mg via INTRAVENOUS
  Filled 2015-06-24 (×2): qty 15

## 2015-06-24 NOTE — Progress Notes (Signed)
Patient seen for trach team follow up.  No education given at this time.  All needed equipment at the bedside.  Will continue to follow. 
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
## 2015-06-24 NOTE — Trach Care Team (Signed)
Trach Care Progression Note   Patient Details Name: Anthony Avila MRN: [id number] DOB: 12/30/1945 Today's Date: 06/24/2015   Tracheostomy Assessment    Tracheostomy Shiley 7 mm Cuffed (Active)  Status Secured 06/24/2015  1:30 PM  Site Assessment Clean;Dry 06/24/2015  1:30 PM  Site Care Cleansed;Dried;Dressing applied 06/24/2015  6:25 AM  Inner Cannula Care No inner cannula 06/24/2015 12:00 PM  Ties Assessment Clean;Dry;Secure 06/24/2015  1:30 PM  Cuff pressure (cm) 50 cm 06/24/2015 12:00 PM  Emergency Equipment at bedside Yes 06/24/2015  1:30 PM     Care Needs     Respiratory Therapy Tracheostomy: Chronic trach O2 Device: Ventilator FiO2 (%): 30 % SpO2: 100 % Education: Not applicable Follow up recommendations:  (will follow for progression) Respiratory barriers to progression: Other (comment) (full vent support at this time.)    Speech Language Pathology  SLP chart review complete: Patient currently receiving at SLP services Patient may use Passy-Muir Speech Valve: with SLP only PMSV Supervision: Full Follow up Recommendations:  (TBD)   Physical Therapy      Occupational Therapy      Nutritional Patient's Current Diet: Tube feeding Tube Feeding: Vital AF 1.2 Cal Tube Feeding Frequency: Continuous Tube Feeding Strength: Full strength    Case Management/Social Work Level of patient care prior to hospitalization: SNF/Assisted  living facility Living status:  ([facility]) Insurance payer: Medicare Barriers to progression:  (repetitive infections/inability to wean/placement issues) Plan to address barriers:  (continued medical workup) Anticipated discharge disposition: SNF/Assisted  living facility    Provider Trach Care Team/Provider Recommendations Trach Care Team Members Present-  Lysbeth PennerMary Smith, RT, Shon BatonJenna Holloman, SW,  Harlon DittyBonnie DeBlois, SLP    SLP evaluating pt for in-line vent PMSV; Discharge plan pending.          Anthony Avila, Anthony Avila 06/24/2015, 2:05 PM   Scribe for team
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
## 2015-06-24 NOTE — Progress Notes (Signed)
RT note: Chest Vest held at this time pt. has feeding tube in place /active at this time, RT to monitor.

## 2015-06-25 DIAGNOSIS — D638 Anemia in other chronic diseases classified elsewhere: Secondary | ICD-10-CM

## 2015-06-25 LAB — GLUCOSE, CAPILLARY
GLUCOSE-CAPILLARY: 119 mg/dL — AB (ref 65–99)
GLUCOSE-CAPILLARY: 128 mg/dL — AB (ref 65–99)
GLUCOSE-CAPILLARY: 153 mg/dL — AB (ref 65–99)
GLUCOSE-CAPILLARY: 91 mg/dL (ref 65–99)
GLUCOSE-CAPILLARY: 99 mg/dL (ref 65–99)
Glucose-Capillary: 120 mg/dL — ABNORMAL HIGH (ref 65–99)

## 2015-06-25 LAB — GENTAMICIN LEVEL, PEAK: GENTAMICIN PK: 3.6 ug/mL — AB (ref 5.0–10.0)

## 2015-06-25 MED ORDER — FREE WATER
100.0000 mL | Freq: Four times a day (QID) | Status: DC
  Administered 2015-06-25 – 2015-06-28 (×12): 100 mL

## 2015-06-25 NOTE — Progress Notes (Addendum)
[facility] in [place] is unable take patient at this time due to current medical issues.  [facility] in [place] is still offering a bed but pt son is likely not agreeable to that facility due to reviews.  CSW informed pt son that per [place] patient will be ready Monday for DC- son is now agreeable to returning to [facility] next week when pt is ready.  CSW spoke with [facility] who states they are unsure of bed availability for Monday and will have to see if anyone discharges over the weekend.  CSW will continue to follow  Merlyn LotJenna Holoman, [facility] LLCCSWA Clinical Social Worker [phone]
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
## 2015-06-25 NOTE — Progress Notes (Signed)
Daily Progress Note   Patient Name: Anthony Avila       Date: 06/25/2015 DOB: 10/11/1945  Age: 69 y.o. MRN#: [id number] Attending Physician: Joseph ArtJessica U Vann, DO Primary Care Physician: [name], [name], MD Admit Date: 05/23/2015  Reason for Consultation/Follow-up: Establishing goals of care, Non pain symptom management and Pain control  Subjective:    Anthony Avila is with SLP and RT and speaking with inline PMV. He is speaking quite well and voice sounds strong. Tolerating having a conversation with me briefly and speaking in full sentences. He is smiling and very pleased to be able to communicate although acknowledges that this still takes much effort for him. He acknowledges the conversation that we have been having and that his prognosis is poor. He is fearful. We spoke about how we can optimize his QOL and makes his day to day enjoyable or at least better. His main request was that he is heard - he feels many people overrun his feelings and do not listen to what he tries to tell them - this is very frustrating when he has no choice but to rely on these same people for all his needs. For example: telling him not to worry when he complains of feeling he is not getting enough air or choking when all his "numbers" or vital signs and vent readings are in normal limits. He says he very much appreciates nurses such as Damaris HippoAna, Tata, and [place]? who at least assess the situation and troubleshoot with him - he appreciates the effort. He confirms desire for continued aggressive care.   Palliative will sign off. Please consult for further palliative needs.   Length of Stay: 33 days  Current Medications: Scheduled Meds:  . albuterol  2.5 mg Nebulization Q6H  . antiseptic oral rinse  7 mL Mouth Rinse QID  . ascorbic acid  500 mg Per Tube Daily  . baclofen  10 mg Per Tube Q6H  . chlorhexidine gluconate  15 mL Mouth Rinse BID  . collagenase   Topical Daily  . dolutegravir  50 mg Oral Daily  . DULoxetine  30 mg  Oral Daily  . emtricitabine-tenofovir AF  1 tablet Oral Daily  . fentaNYL  100 mcg Transdermal Q72H  . free water  100 mL Per Tube 4 times per day  . gentamicin  7 mg/kg Intravenous Q48H  . heparin subcutaneous  5,000 Units Subcutaneous 3 times per day  . insulin aspart  0-15 Units Subcutaneous 6 times per day  . insulin glargine  18 Units Subcutaneous QHS  . levofloxacin (LEVAQUIN) IV  750 mg Intravenous Q24H  . multivitamin  5 mL Per Tube Daily  . pantoprazole sodium  40 mg Per Tube Q24H  . polyethylene glycol  17 g Oral Daily  . propantheline  15 mg Oral TID WC & HS  . QUEtiapine  50 mg Oral QHS  . sennosides  10 mL Per Tube BID    Continuous Infusions: . sodium chloride 10 mL/hr at 06/24/15 1541  . feeding supplement (VITAL AF 1.2 CAL) 1,000 mL (06/24/15 0603)    PRN Meds: acetaminophen, albuterol, ALPRAZolam, bisacodyl, fentaNYL (SUBLIMAZE) injection, hydrALAZINE, iohexol, ondansetron (ZOFRAN) IV, phenol  Palliative Performance Scale: 20%     Vital Signs: BP 141/65 mmHg  Pulse 62  Temp(Src) 97.9 F (36.6 C) (Oral)  Resp 13  Ht 6\' 2"  (1.88 m)  Wt 88.451 kg (195 lb)  BMI 25.03 kg/m2  SpO2 93% SpO2: SpO2: 93 % O2 Device: O2 Device:  Ventilator O2 Flow Rate:    Intake/output summary:   Intake/Output Summary (Last 24 hours) at 06/25/15 1517 Last data filed at 06/25/15 1100  Gross per 24 hour  Intake   1335 ml  Output   1125 ml  Net    210 ml   LBM: Last BM Date: 06/24/15 Baseline Weight: Weight: 76.658 kg (169 lb) Most recent weight: Weight: 88.451 kg (195 lb)  Physical Exam: General: Lying in bed HEENT: Trach to vent CVS: RRR Resp: Vent, no tachypnea Abd: Soft, ND Neuro: Awake, alert, oriented x3    Additional Data Reviewed: Recent Labs     06/24/15  0512  WBC  6.0  HGB  8.1*  PLT  293  NA  141  BUN  22*  CREATININE  0.63     Problem List:  Patient Active Problem List   Diagnosis Date Noted  . Infection with Stenotrophomonas maltophilia  resistant to multiple drugs   . Pseudomonas aeruginosa infection   . Acute on chronic respiratory failure with hypoxia (HCC) 06/16/2015  . VAP (ventilator-associated pneumonia) (HCC)   . Lung collapse   . History of MDR Pseudomonas aeruginosa infection   . Acute respiratory failure (HCC)   . Neck pain   . Ventilator dependence (HCC)   . Respiratory distress   . UTI (lower urinary tract infection)   . Anemia of chronic disease   . HIV (human immunodeficiency virus infection) (HCC)   . Palliative care encounter   . Acute neck pain   . 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 Atelectasis   . Respiratory failure (HCC)   . Apnea   . Arm swelling   . Hypoxia   . Leaking PEG tube (HCC)   . Pulmonary edema   . HCAP (healthcare-associated pneumonia) 05/23/2015  . Pressure ulcer 05/23/2015     Palliative Care Assessment & Plan    Code Status:  Full code  Goals of Care:  Full aggressive care with understanding prognosis is very poor.   Desire for further Chaplaincy support:no  3. Symptom Management:  Pain: Continue fentanyl 100 mcg every 2 hours prn. Fentanyl patch 100 mcg every 72 hrs.   Anxiety: Continue xanax prn.   4. Palliative Prophylaxis:  Turn/reposition, bowel regimen, aspiration precautions  5. Prognosis: Poor prognosis with likelihood of recurrent/continuing infection and recurrent hospitalizations.   5. Discharge Planning: Will need vent SNF although they have not been happy with [facility]. Unfortunately options are very few for the amount of care he will require.   Thank you for allowing the Palliative Medicine Team to assist in the care of this patient.   Time In: 1420 Time Out: 1445 Total Time Prolonged Time Billed  no     Greater than 50%  of this time was spent counseling and coordinating care related to the above assessment and plan.     Yong Channel, NP Palliative Medicine Team Pager # [phone] (M-F 8a-5p) Team Phone # [phone]  (Nights/Weekends)  06/25/2015, 3:17 PM
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
## 2015-06-25 NOTE — Progress Notes (Signed)
Patient refused Chest vest tonight due to family visiting. RT will continue to monitor.
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
## 2015-06-26 LAB — BASIC METABOLIC PANEL
ANION GAP: 9 (ref 5–15)
BUN: 20 mg/dL (ref 6–20)
CALCIUM: 9.3 mg/dL (ref 8.9–10.3)
CHLORIDE: 97 mmol/L — AB (ref 101–111)
CO2: 33 mmol/L — ABNORMAL HIGH (ref 22–32)
CREATININE: 0.68 mg/dL (ref 0.61–1.24)
GFR calc non Af Amer: >60 mL/min (ref >60)
Glucose, Bld: 95 mg/dL (ref 65–99)
Potassium: 4.1 mmol/L (ref 3.5–5.1)
SODIUM: 139 mmol/L (ref 135–145)

## 2015-06-26 LAB — CBC
HEMATOCRIT: 26.7 % — AB (ref 39.0–52.0)
HEMOGLOBIN: 8 g/dL — AB (ref 13.0–17.0)
MCH: 26.2 pg (ref 26.0–34.0)
MCHC: 30 g/dL (ref 30.0–36.0)
MCV: 87.5 fL (ref 78.0–100.0)
Platelets: 223 10*3/uL (ref 150–400)
RBC: 3.05 MIL/uL — ABNORMAL LOW (ref 4.22–5.81)
RDW: 16.9 % — AB (ref 11.5–15.5)
WBC: 7.8 10*3/uL (ref 4.0–10.5)

## 2015-06-26 LAB — GLUCOSE, CAPILLARY
GLUCOSE-CAPILLARY: 109 mg/dL — AB (ref 65–99)
GLUCOSE-CAPILLARY: 139 mg/dL — AB (ref 65–99)

## 2015-06-26 MED ORDER — FUROSEMIDE 10 MG/ML IJ SOLN
20.0000 mg | Freq: Once | INTRAMUSCULAR | Status: AC
  Administered 2015-06-26: 20 mg via INTRAVENOUS
  Filled 2015-06-26: qty 2
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
## 2015-06-26 NOTE — Progress Notes (Signed)
ANTIBIOTIC CONSULT NOTE - FOLLOW UP  Pharmacy Consult for Gentamicin Indication: pneumonia  No Known Allergies  Patient Measurements: Height: 6\' 2"  (188 cm) Weight: 193 lb (87.544 kg) IBW/kg (Calculated) : 82.2 Adjusted Body Weight:   Vital Signs: Temp: 98.4 F (36.9 C) (11/05 0400) Temp Source: Oral (11/05 0400) BP: 125/64 mmHg (11/05 0900) Pulse Rate: 76 (11/05 0900) Intake/Output from previous day: 11/04 0701 - 11/05 0700 In: 2495 [I.V.:230; NG/GT:2000; IV Piggyback:265] Out: 2225 [Urine:2225] Intake/Output from this shift:    Labs:  Recent Labs  06/24/15 0512 06/26/15 0449  WBC 6.0 7.8  HGB 8.1* 8.0*  PLT 293 223  CREATININE 0.63 0.68   Estimated Creatinine Clearance: 102.8 mL/min (by C-G formula based on Cr of 0.68).  Recent Labs  06/23/15 1848 06/24/15 1945  GENTTROUGH 0.8  --   GENTPEAK  --  3.6*     Microbiology: Recent Results (from the past 720 hour(s))  Culture, blood (routine x 2)     Status: None   Collection Time: 06/01/15  3:36 PM  Result Value Ref Range Status   Specimen Description BLOOD LEFT HAND  Final   Special Requests BOTTLES DRAWN AEROBIC ONLY  4CC  Final   Culture NO GROWTH 5 DAYS  Final   Report Status 06/06/2015 FINAL  Final  Culture, blood (routine x 2)     Status: None   Collection Time: 06/01/15  3:49 PM  Result Value Ref Range Status   Specimen Description BLOOD LEFT HAND  Final   Special Requests BOTTLES DRAWN AEROBIC ONLY  5CC  Final   Culture NO GROWTH 5 DAYS  Final   Report Status 06/06/2015 FINAL  Final  Culture, respiratory (NON-Expectorated)     Status: None   Collection Time: 06/01/15  6:26 PM  Result Value Ref Range Status   Specimen Description TRACHEAL ASPIRATE  Final   Special Requests NONE  Final   Gram Stain   Final    ABUNDANT WBC PRESENT, PREDOMINANTLY PMN NO SQUAMOUS EPITHELIAL CELLS SEEN FEW GRAM NEGATIVE RODS    Culture MODERATE PSEUDOMONAS AERUGINOSA  Final   Report Status 06/04/2015 FINAL   Final   Organism ID, Bacteria PSEUDOMONAS AERUGINOSA  Final      Susceptibility   Pseudomonas aeruginosa - MIC*    CEFEPIME 16 INTERMEDIATE Intermediate     CEFTAZIDIME >=64 RESISTANT Resistant     CIPROFLOXACIN 1 SENSITIVE Sensitive     GENTAMICIN <=1 SENSITIVE Sensitive     IMIPENEM 2 SENSITIVE Sensitive     TOBRAMYCIN Value in next row Sensitive      <=1 SENSITIVEPerformed at [facility]    * MODERATE PSEUDOMONAS AERUGINOSA  Culture, blood (routine x 2)     Status: None   Collection Time: 06/12/15  1:28 AM  Result Value Ref Range Status   Specimen Description BLOOD RIGHT HAND  Final   Special Requests BOTTLES DRAWN AEROBIC AND ANAEROBIC 7CC  Final   Culture NO GROWTH 5 DAYS  Final   Report Status 06/17/2015 FINAL  Final  Culture, blood (routine x 2)     Status: None   Collection Time: 06/12/15  1:33 AM  Result Value Ref Range Status   Specimen Description BLOOD RIGHT HAND  Final   Special Requests BOTTLES DRAWN AEROBIC AND ANAEROBIC 5CC  Final   Culture NO GROWTH 5 DAYS  Final   Report Status 06/17/2015 FINAL  Final  Culture, respiratory (NON-Expectorated)     Status: None   Collection Time: 06/15/15  11:50 AM  Result Value Ref Range Status   Specimen Description TRACHEAL ASPIRATE  Final   Special Requests Immunocompromised  Final   Gram Stain   Final    MODERATE WBC PRESENT,BOTH PMN AND MONONUCLEAR NO SQUAMOUS EPITHELIAL CELLS SEEN RARE GRAM NEGATIVE RODS [facility]    Culture   Final    MODERATE PSEUDOMONAS AERUGINOSA [facility] at [facility]    Report Status 06/18/2015 FINAL  Final   Organism ID, Bacteria PSEUDOMONAS AERUGINOSA  Final      Susceptibility   Pseudomonas aeruginosa - MIC*    CEFEPIME 16 INTERMEDIATE Intermediate     CEFTAZIDIME >=64 RESISTANT Resistant     CIPROFLOXACIN <=0.25 SENSITIVE Sensitive     GENTAMICIN <=1 SENSITIVE Sensitive     IMIPENEM >=16 RESISTANT Resistant     TOBRAMYCIN <=1 SENSITIVE  Sensitive     * MODERATE PSEUDOMONAS AERUGINOSA  Culture, respiratory (NON-Expectorated)     Status: None   Collection Time: 06/16/15  2:15 PM  Result Value Ref Range Status   Specimen Description BRONCHIAL ALVEOLAR LAVAGE  Final   Special Requests Immunocompromised  Final   Gram Stain   Final    MODERATE WBC PRESENT,BOTH PMN AND MONONUCLEAR RARE SQUAMOUS EPITHELIAL CELLS PRESENT FEW GRAM NEGATIVE RODS RARE GRAM POSITIVE COCCI IN PAIRS IN CLUSTERS    Culture   Final    ABUNDANT PSEUDOMONAS AERUGINOSA STENOTROPHOMONAS MALTOPHILIA 30 Note: CORRECTED RESULTS CALLED TO: TATA C AT [place] 10 16 @ 0915 BY PARDA Note: PREVIOUS RESULT  STAPHYLOCOCCUS SPECIES (COAGULASE NEGATIVE) Performed at [facility]    Report Status 06/21/2015 FINAL  Final   Organism ID, Bacteria PSEUDOMONAS AERUGINOSA  Final   Organism ID, Bacteria STENOTROPHOMONAS MALTOPHILIA  Final      Susceptibility   Pseudomonas aeruginosa - MIC*    CEFEPIME 16 INTERMEDIATE Intermediate     CEFTAZIDIME >=64 RESISTANT Resistant     CIPROFLOXACIN <=0.25 SENSITIVE Sensitive     GENTAMICIN <=1 SENSITIVE Sensitive     IMIPENEM >=16 RESISTANT Resistant     TOBRAMYCIN <=1 SENSITIVE Sensitive     * ABUNDANT PSEUDOMONAS AERUGINOSA   Stenotrophomonas maltophilia - MIC*    TRIMETH/SULFA Value in next row Sensitive      <=20 SENSITIVE(NOTE)    LEVOFLOXACIN Value in next row Sensitive      <=20 SENSITIVE(NOTE)    * STENOTROPHOMONAS MALTOPHILIA  Urine culture     Status: None   Collection Time: 06/22/15  6:15 PM  Result Value Ref Range Status   Specimen Description URINE, CATHETERIZED  Final   Special Requests NONE  Final   Culture NO GROWTH 1 DAY  Final   Report Status 06/23/2015 FINAL  Final    Anti-infectives    Start     Dose/Rate Route Frequency Ordered Stop   06/25/15 2000  gentamicin (GARAMYCIN) 600 mg in dextrose 5 % 100 mL IVPB     7 mg/kg  85.7 kg 115 mL/hr over 60 Minutes Intravenous Every 48 hours  06/24/15 0852 07/03/15 1959   06/23/15 1915  gentamicin (GARAMYCIN) 600 mg in dextrose 5 % 100 mL IVPB  Status:  Discontinued     7 mg/kg  85.7 kg 115 mL/hr over 60 Minutes Intravenous Every 48 hours 06/21/15 2051 06/24/15 0852   06/21/15 1800  levofloxacin (LEVAQUIN) IVPB 750 mg     750 mg 100 mL/hr over 90 Minutes Intravenous Every 24 hours 06/21/15 1656 07/01/15 2359   06/19/15 1200  vancomycin (VANCOCIN) IVPB 1000 mg/200 mL premix  Status:  Discontinued     1,000 mg 200 mL/hr over 60 Minutes Intravenous Every 12 hours 06/19/15 1113 06/21/15 1253   06/18/15 0900  ciprofloxacin (CIPRO) IVPB 400 mg  Status:  Discontinued     400 mg 200 mL/hr over 60 Minutes Intravenous Every 8 hours 06/18/15 0850 06/21/15 1656   06/17/15 0400  gentamicin (GARAMYCIN) 600 mg in dextrose 5 % 100 mL IVPB  Status:  Discontinued     7 mg/kg  85.7 kg 115 mL/hr over 60 Minutes Intravenous Every 36 hours 06/16/15 1025 06/21/15 2051   06/15/15 1400  gentamicin (GARAMYCIN) 600 mg in dextrose 5 % 100 mL IVPB     7 mg/kg  85.7 kg 115 mL/hr over 60 Minutes Intravenous  Once 06/15/15 1300 06/15/15 1642   06/15/15 1300  meropenem (MERREM) 1 g in sodium chloride 0.9 % 100 mL IVPB  Status:  Discontinued     1 g 200 mL/hr over 30 Minutes Intravenous 3 times per day 06/15/15 1242 06/18/15 0850   06/02/15 0400  vancomycin (VANCOCIN) IVPB 750 mg/150 ml premix  Status:  Discontinued     750 mg 150 mL/hr over 60 Minutes Intravenous Every 12 hours 06/01/15 1502 06/03/15 0858   06/01/15 1700  imipenem-cilastatin (PRIMAXIN) 500 mg in sodium chloride 0.9 % 100 mL IVPB     500 mg 200 mL/hr over 30 Minutes Intravenous Every 6 hours 06/01/15 1628 06/15/15 0115   06/01/15 1600  cefTAZidime (FORTAZ) 2 g in dextrose 5 % 50 mL IVPB  Status:  Discontinued     2 g 100 mL/hr over 30 Minutes Intravenous 3 times per day 06/01/15 1502 06/01/15 1619   06/01/15 1515  vancomycin (VANCOCIN) IVPB 1000 mg/200 mL premix     1,000 mg 200 mL/hr  over 60 Minutes Intravenous  Once 06/01/15 1502 06/01/15 1839   05/25/15 1800  ciprofloxacin (CIPRO) IVPB 400 mg  Status:  Discontinued     400 mg 200 mL/hr over 60 Minutes Intravenous Every 12 hours 05/25/15 1749 05/28/15 1109   05/25/15 1745  ciprofloxacin (CIPRO) IVPB 400 mg  Status:  Discontinued     400 mg 200 mL/hr over 60 Minutes Intravenous Every 12 hours 05/25/15 1744 05/25/15 1751   05/25/15 1600  vancomycin (VANCOCIN) IVPB 750 mg/150 ml premix  Status:  Discontinued     750 mg 150 mL/hr over 60 Minutes Intravenous Every 12 hours 05/25/15 0943 05/26/15 1245   05/24/15 1015  dolutegravir (TIVICAY) tablet 50 mg     50 mg Oral Daily 05/24/15 1002     05/24/15 1015  emtricitabine-tenofovir AF (DESCOVY) 200-25 MG per tablet 1 tablet     1 tablet Oral Daily 05/24/15 1002     05/23/15 2200  vancomycin (VANCOCIN) IVPB 750 mg/150 ml premix  Status:  Discontinued     750 mg 150 mL/hr over 60 Minutes Intravenous Every 8 hours 05/23/15 1633 05/25/15 0901   05/23/15 2200  piperacillin-tazobactam (ZOSYN) IVPB 3.375 g  Status:  Discontinued     3.375 g 12.5 mL/hr over 240 Minutes Intravenous 3 times per day 05/23/15 1633 05/25/15 1752   05/23/15 1515  vancomycin (VANCOCIN) IVPB 1000 mg/200 mL premix     1,000 mg 200 mL/hr over 60 Minutes Intravenous  Once 05/23/15 1502 05/23/15 1700   05/23/15 1515  piperacillin-tazobactam (ZOSYN) IVPB 3.375 g     3.375 g 100 mL/hr over 30 Minutes Intravenous  Once 05/23/15 1502 05/23/15  1636      Assessment: 69yo male with 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  Gent trough on 11/2 indicating pt clearing drug on current dose of  IV q48.  Cr remains < 1 (though not indicative of renal status given quadriplegic state) and goo UOP.  Plan is to continue Gentamicin through 11/10.  Goal of Therapy:  Gentamicin trough  < 1  Plan:  Continue Gentamicin  IV q48 Repeat trough on 11/6 Watch renal  fxn  Marisue Humble, PharmD Clinical Pharmacist [facility]

## 2015-06-27 LAB — GLUCOSE, CAPILLARY
GLUCOSE-CAPILLARY: 136 mg/dL — AB (ref 65–99)
GLUCOSE-CAPILLARY: 163 mg/dL — AB (ref 65–99)
Glucose-Capillary: 142 mg/dL — ABNORMAL HIGH (ref 65–99)
Glucose-Capillary: 138 mg/dL — ABNORMAL HIGH (ref 65–99)
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
## 2015-06-28 LAB — CBC
HEMATOCRIT: 27.3 % — AB (ref 39.0–52.0)
Hemoglobin: 8.1 g/dL — ABNORMAL LOW (ref 13.0–17.0)
MCH: 25.7 pg — ABNORMAL LOW (ref 26.0–34.0)
MCHC: 29.7 g/dL — AB (ref 30.0–36.0)
MCV: 86.7 fL (ref 78.0–100.0)
Platelets: 212 10*3/uL (ref 150–400)
RBC: 3.15 MIL/uL — ABNORMAL LOW (ref 4.22–5.81)
RDW: 16.9 % — ABNORMAL HIGH (ref 11.5–15.5)
WBC: 7.1 10*3/uL (ref 4.0–10.5)

## 2015-06-28 LAB — GLUCOSE, CAPILLARY
GLUCOSE-CAPILLARY: 113 mg/dL — AB (ref 65–99)
GLUCOSE-CAPILLARY: 139 mg/dL — AB (ref 65–99)
GLUCOSE-CAPILLARY: 168 mg/dL — AB (ref 65–99)
Glucose-Capillary: 110 mg/dL — ABNORMAL HIGH (ref 65–99)

## 2015-06-28 LAB — BASIC METABOLIC PANEL
Anion gap: 8 (ref 5–15)
BUN: 23 mg/dL — AB (ref 6–20)
CHLORIDE: 95 mmol/L — AB (ref 101–111)
CO2: 35 mmol/L — AB (ref 22–32)
Calcium: 9.2 mg/dL (ref 8.9–10.3)
Creatinine, Ser: 0.73 mg/dL (ref 0.61–1.24)
GFR calc Af Amer: >60 mL/min (ref >60)
GFR calc non Af Amer: >60 mL/min (ref >60)
Glucose, Bld: 141 mg/dL — ABNORMAL HIGH (ref 65–99)
POTASSIUM: 4.1 mmol/L (ref 3.5–5.1)
SODIUM: 138 mmol/L (ref 135–145)

## 2015-06-28 LAB — GENTAMICIN LEVEL, TROUGH: GENTAMICIN TR: 0.6 ug/mL (ref 0.5–2.0)

## 2015-06-28 MED ORDER — ACETAMINOPHEN 325 MG PO TABS: 650.0000 mg | ORAL_TABLET | ORAL | Status: AC | PRN

## 2015-06-28 MED ORDER — BACLOFEN 10 MG PO TABS: 10.0000 mg | ORAL_TABLET | Freq: Four times a day (QID) | ORAL | Status: DC

## 2015-06-28 MED ORDER — FREE WATER: 100.0000 mL | Freq: Four times a day (QID) | Status: AC

## 2015-06-28 MED ORDER — FENTANYL 100 MCG/HR TD PT72: 100.0000 ug | MEDICATED_PATCH | TRANSDERMAL | Status: DC

## 2015-06-28 MED ORDER — LEVOFLOXACIN IN D5W 750 MG/150ML IV SOLN: 750.0000 mg | INTRAVENOUS | Status: DC

## 2015-06-28 MED ORDER — ALPRAZOLAM 0.5 MG PO TABS: 0.5000 mg | ORAL_TABLET | Freq: Three times a day (TID) | ORAL | Status: DC | PRN

## 2015-06-28 MED ORDER — DULOXETINE HCL 30 MG PO CPEP: 30.0000 mg | ORAL_CAPSULE | Freq: Every day | ORAL | Status: DC

## 2015-06-28 MED ORDER — OXYCODONE-ACETAMINOPHEN 5-325 MG PO TABS: 1.0000 | ORAL_TABLET | Freq: Three times a day (TID) | ORAL | Status: DC | PRN

## 2015-06-28 MED ORDER — FUROSEMIDE 10 MG/ML IJ SOLN
40.0000 mg | Freq: Once | INTRAMUSCULAR | Status: AC
  Administered 2015-06-28: 40 mg via INTRAVENOUS
  Filled 2015-06-28: qty 4

## 2015-06-28 MED ORDER — GENTAMICIN SULFATE 40 MG/ML IJ SOLN: 7.0000 mg/kg | INTRAVENOUS | Status: DC

## 2015-06-28 MED ORDER — INSULIN GLARGINE 100 UNIT/ML ~~LOC~~ SOLN: 18.0000 [IU] | Freq: Every day | SUBCUTANEOUS | Status: DC

## 2015-06-28 MED ORDER — SODIUM CHLORIDE 0.9 % IJ SOLN: 10.0000 mL | INTRAMUSCULAR | Status: DC | PRN

## 2015-06-28 MED ORDER — QUETIAPINE FUMARATE 50 MG PO TABS: 50.0000 mg | ORAL_TABLET | Freq: Every day | ORAL | Status: DC

## 2015-06-28 MED ORDER — ALBUTEROL SULFATE (2.5 MG/3ML) 0.083% IN NEBU
2.5000 mg | INHALATION_SOLUTION | Freq: Three times a day (TID) | RESPIRATORY_TRACT | Status: DC
  Administered 2015-06-28 (×2): 2.5 mg via RESPIRATORY_TRACT
  Filled 2015-06-28 (×2): qty 3

## 2015-06-28 MED ORDER — ALBUTEROL SULFATE (2.5 MG/3ML) 0.083% IN NEBU: 2.5000 mg | INHALATION_SOLUTION | Freq: Three times a day (TID) | RESPIRATORY_TRACT | Status: DC

## 2015-06-28 NOTE — Progress Notes (Signed)
[facility].

## 2015-06-28 NOTE — Progress Notes (Signed)
SLP Cancellation Note  Patient Details Name: Anthony Avila MRN: [id number] DOB: 11/14/1945   Cancelled treatment:        Unable to see for in-line PMSV due to desaturations per RT. Will continue efforts   Royce MacadamiaLitaker, Maylen Waltermire Willis 06/28/2015, 3:24 PM  Breck CoonsLisa Willis Lonell FaceLitaker M.Ed [employer] Pager [phone]

## 2015-06-28 NOTE — Progress Notes (Signed)
Perry(son) and Kelly(uncle) called and advised of discharge to [facility].

## 2015-06-28 NOTE — Clinical Social Work Note (Addendum)
Patient is discharging back to [facility] for continued care today. Mr. Anthony Avila will be transported by [facility]. CSW talked with Kennyth ArnoldStacy at [facility] [phone], [phone]) regarding patient's discharge and discharge summary transmitted to facility.  Call made to son, [name] at home number [phone]) and message left and call made to work number [phone]), no answer.   Genelle BalVanessa Leshia Kope, MSW, LCSW Licensed Clinical Social Worker Clinical Social Work Department [facility] [phone]

## 2015-06-28 NOTE — Progress Notes (Signed)
Peripherally Inserted Central Catheter/Midline Placement  The IV Nurse has discussed with the patient and/or persons authorized to consent for the patient, the purpose of this procedure and the potential benefits and risks involved with this procedure.  The benefits include less needle sticks, lab draws from the catheter and patient may be discharged home with the catheter.  Risks include, but not limited to, infection, bleeding, blood clot (thrombus formation), and puncture of an artery; nerve damage and irregular heat beat.  Alternatives to this procedure were also discussed.  PICC/Midline Placement Documentation  PICC / Midline Single Lumen 06/28/15 PICC Right Basilic 42 cm 0 cm (Active)  Indication for Insertion or Continuance of Line Prolonged intravenous therapies;Home intravenous therapies (PICC only) 06/28/2015  2:33 PM  Exposed Catheter (cm) 0 cm 06/28/2015  2:33 PM  Dressing Change Due 07/05/15 06/28/2015  2:33 PM       Dewain Penningimmons, Verlin Duke M 06/28/2015, 2:33 PM

## 2015-06-28 NOTE — Progress Notes (Signed)
Dressing changed to sacral region and santyl cream applied with sterile dressing; Flexiseal  irriagated. IV fluid saline locked.

## 2015-06-28 NOTE — Progress Notes (Signed)
[facility] ready for [facility]- family is agreeable to return to [facility].  CSW will continue to follow  Merlyn LotJenna Holoman, [facility] Clinical Social Worker [phone]

## 2015-06-28 NOTE — Progress Notes (Signed)
All uninventoried belonging packed up and sent with patient to [facility] hospital.
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
## 2015-06-28 NOTE — Progress Notes (Signed)
ANTIBIOTIC CONSULT NOTE - FOLLOW UP  Pharmacy Consult for Gentamicin Indication: pneumonia  No Known Allergies  Patient Measurements: Height: 6\' 2"  (188 cm) Weight: 196 lb (88.905 kg) IBW/kg (Calculated) : 82.2  Vital Signs: Temp: 98 F (36.7 C) (11/07 0033) Temp Source: Oral (11/07 0033) BP: 161/65 mmHg (11/07 0033) Pulse Rate: 73 (11/07 0033) Intake/Output from previous day: 11/06 0701 - 11/07 0700 In: 1645 [I.V.:170; NG/GT:1475] Out: 1450 [Urine:1450] Intake/Output from this shift: Total I/O In: 425 [I.V.:50; NG/GT:375] Out: 500 [Urine:500]  Labs:  Recent Labs  06/26/15 0449  WBC 7.8  HGB 8.0*  PLT 223  CREATININE 0.68   Estimated Creatinine Clearance: 102.8 mL/min (by C-G formula based on Cr of 0.68).  Recent Labs  06/27/15 1943  GENTTROUGH 0.6     Microbiology: Recent Results (from the past 720 hour(s))  Culture, blood (routine x 2)     Status: None   Collection Time: 06/01/15  3:36 PM  Result Value Ref Range Status   Specimen Description BLOOD LEFT HAND  Final   Special Requests BOTTLES DRAWN AEROBIC ONLY  4CC  Final   Culture NO GROWTH 5 DAYS  Final   Report Status 06/06/2015 FINAL  Final  Culture, blood (routine x 2)     Status: None   Collection Time: 06/01/15  3:49 PM  Result Value Ref Range Status   Specimen Description BLOOD LEFT HAND  Final   Special Requests BOTTLES DRAWN AEROBIC ONLY  5CC  Final   Culture NO GROWTH 5 DAYS  Final   Report Status 06/06/2015 FINAL  Final  Culture, respiratory (NON-Expectorated)     Status: None   Collection Time: 06/01/15  6:26 PM  Result Value Ref Range Status   Specimen Description TRACHEAL ASPIRATE  Final   Special Requests NONE  Final   Gram Stain   Final    ABUNDANT WBC PRESENT, PREDOMINANTLY PMN NO SQUAMOUS EPITHELIAL CELLS SEEN FEW GRAM NEGATIVE RODS    Culture MODERATE PSEUDOMONAS AERUGINOSA  Final   Report Status 06/04/2015 FINAL  Final   Organism ID, Bacteria PSEUDOMONAS AERUGINOSA  Final       Susceptibility   Pseudomonas aeruginosa - MIC*    CEFEPIME 16 INTERMEDIATE Intermediate     CEFTAZIDIME >=64 RESISTANT Resistant     CIPROFLOXACIN 1 SENSITIVE Sensitive     GENTAMICIN <=1 SENSITIVE Sensitive     IMIPENEM 2 SENSITIVE Sensitive     TOBRAMYCIN Value in next row Sensitive      <=1 SENSITIVEPerformed at [facility]    * MODERATE PSEUDOMONAS AERUGINOSA  Culture, blood (routine x 2)     Status: None   Collection Time: 06/12/15  1:28 AM  Result Value Ref Range Status   Specimen Description BLOOD RIGHT HAND  Final   Special Requests BOTTLES DRAWN AEROBIC AND ANAEROBIC 7CC  Final   Culture NO GROWTH 5 DAYS  Final   Report Status 06/17/2015 FINAL  Final  Culture, blood (routine x 2)     Status: None   Collection Time: 06/12/15  1:33 AM  Result Value Ref Range Status   Specimen Description BLOOD RIGHT HAND  Final   Special Requests BOTTLES DRAWN AEROBIC AND ANAEROBIC 5CC  Final   Culture NO GROWTH 5 DAYS  Final   Report Status 06/17/2015 FINAL  Final  Culture, respiratory (NON-Expectorated)     Status: None   Collection Time: 06/15/15 11:50 AM  Result Value Ref Range Status   Specimen Description TRACHEAL ASPIRATE  Final  Special Requests Immunocompromised  Final   Gram Stain   Final    MODERATE WBC PRESENT,BOTH PMN AND MONONUCLEAR NO SQUAMOUS EPITHELIAL CELLS SEEN RARE GRAM NEGATIVE RODS Performed at [facility]    Culture   Final    MODERATE PSEUDOMONAS AERUGINOSA Performed at [facility]    Report Status 06/18/2015 FINAL  Final   Organism ID, Bacteria PSEUDOMONAS AERUGINOSA  Final      Susceptibility   Pseudomonas aeruginosa - MIC*    CEFEPIME 16 INTERMEDIATE Intermediate     CEFTAZIDIME >=64 RESISTANT Resistant     CIPROFLOXACIN <=0.25 SENSITIVE Sensitive     GENTAMICIN <=1 SENSITIVE Sensitive     IMIPENEM >=16 RESISTANT Resistant     TOBRAMYCIN <=1 SENSITIVE Sensitive     * MODERATE PSEUDOMONAS AERUGINOSA  Culture,  respiratory (NON-Expectorated)     Status: None   Collection Time: 06/16/15  2:15 PM  Result Value Ref Range Status   Specimen Description BRONCHIAL ALVEOLAR LAVAGE  Final   Special Requests Immunocompromised  Final   Gram Stain   Final    MODERATE WBC PRESENT,BOTH PMN AND MONONUCLEAR RARE SQUAMOUS EPITHELIAL CELLS PRESENT FEW GRAM NEGATIVE RODS RARE GRAM POSITIVE COCCI IN PAIRS IN CLUSTERS    Culture   Final    ABUNDANT PSEUDOMONAS AERUGINOSA STENOTROPHOMONAS MALTOPHILIA 30 Note: CORRECTED RESULTS CALLED TO: TATA C AT [place] 10 16 @ 0915 BY PARDA Note: PREVIOUS RESULT  STAPHYLOCOCCUS SPECIES (COAGULASE NEGATIVE) Performed at [facility]    Report Status 06/21/2015 FINAL  Final   Organism ID, Bacteria PSEUDOMONAS AERUGINOSA  Final   Organism ID, Bacteria STENOTROPHOMONAS MALTOPHILIA  Final      Susceptibility   Pseudomonas aeruginosa - MIC*    CEFEPIME 16 INTERMEDIATE Intermediate     CEFTAZIDIME >=64 RESISTANT Resistant     CIPROFLOXACIN <=0.25 SENSITIVE Sensitive     GENTAMICIN <=1 SENSITIVE Sensitive     IMIPENEM >=16 RESISTANT Resistant     TOBRAMYCIN <=1 SENSITIVE Sensitive     * ABUNDANT PSEUDOMONAS AERUGINOSA   Stenotrophomonas maltophilia - MIC*    TRIMETH/SULFA Value in next row Sensitive      <=20 SENSITIVE(NOTE)    LEVOFLOXACIN Value in next row Sensitive      <=20 SENSITIVE(NOTE)    * STENOTROPHOMONAS MALTOPHILIA  Urine culture     Status: None   Collection Time: 06/22/15  6:15 PM  Result Value Ref Range Status   Specimen Description URINE, CATHETERIZED  Final   Special Requests NONE  Final   Culture NO GROWTH 1 DAY  Final   Report Status 06/23/2015 FINAL  Final    Anti-infectives    Start     Dose/Rate Route Frequency Ordered Stop   06/25/15 2000  gentamicin (GARAMYCIN) 600 mg in dextrose 5 % 100 mL IVPB     7 mg/kg  85.7 kg 115 mL/hr over 60 Minutes Intravenous Every 48 hours 06/24/15 0852 07/03/15 1959   06/23/15 1915  gentamicin  (GARAMYCIN) 600 mg in dextrose 5 % 100 mL IVPB  Status:  Discontinued     7 mg/kg  85.7 kg 115 mL/hr over 60 Minutes Intravenous Every 48 hours 06/21/15 2051 06/24/15 0852   06/21/15 1800  levofloxacin (LEVAQUIN) IVPB 750 mg     750 mg 100 mL/hr over 90 Minutes Intravenous Every 24 hours 06/21/15 1656 07/01/15 2359   06/19/15 1200  vancomycin (VANCOCIN) IVPB 1000 mg/200 mL premix  Status:  Discontinued     1,000 mg 200  mL/hr over 60 Minutes Intravenous Every 12 hours 06/19/15 1113 06/21/15 1253   06/18/15 0900  ciprofloxacin (CIPRO) IVPB 400 mg  Status:  Discontinued     400 mg 200 mL/hr over 60 Minutes Intravenous Every 8 hours 06/18/15 0850 06/21/15 1656   06/17/15 0400  gentamicin (GARAMYCIN) 600 mg in dextrose 5 % 100 mL IVPB  Status:  Discontinued     7 mg/kg  85.7 kg 115 mL/hr over 60 Minutes Intravenous Every 36 hours 06/16/15 1025 06/21/15 2051   06/15/15 1400  gentamicin (GARAMYCIN) 600 mg in dextrose 5 % 100 mL IVPB     7 mg/kg  85.7 kg 115 mL/hr over 60 Minutes Intravenous  Once 06/15/15 1300 06/15/15 1642   06/15/15 1300  meropenem (MERREM) 1 g in sodium chloride 0.9 % 100 mL IVPB  Status:  Discontinued     1 g 200 mL/hr over 30 Minutes Intravenous 3 times per day 06/15/15 1242 06/18/15 0850   06/02/15 0400  vancomycin (VANCOCIN) IVPB 750 mg/150 ml premix  Status:  Discontinued     750 mg 150 mL/hr over 60 Minutes Intravenous Every 12 hours 06/01/15 1502 06/03/15 0858   06/01/15 1700  imipenem-cilastatin (PRIMAXIN) 500 mg in sodium chloride 0.9 % 100 mL IVPB     500 mg 200 mL/hr over 30 Minutes Intravenous Every 6 hours 06/01/15 1628 06/15/15 0115   06/01/15 1600  cefTAZidime (FORTAZ) 2 g in dextrose 5 % 50 mL IVPB  Status:  Discontinued     2 g 100 mL/hr over 30 Minutes Intravenous 3 times per day 06/01/15 1502 06/01/15 1619   06/01/15 1515  vancomycin (VANCOCIN) IVPB 1000 mg/200 mL premix     1,000 mg 200 mL/hr over 60 Minutes Intravenous  Once 06/01/15 1502 06/01/15  1839   05/25/15 1800  ciprofloxacin (CIPRO) IVPB 400 mg  Status:  Discontinued     400 mg 200 mL/hr over 60 Minutes Intravenous Every 12 hours 05/25/15 1749 05/28/15 1109   05/25/15 1745  ciprofloxacin (CIPRO) IVPB 400 mg  Status:  Discontinued     400 mg 200 mL/hr over 60 Minutes Intravenous Every 12 hours 05/25/15 1744 05/25/15 1751   05/25/15 1600  vancomycin (VANCOCIN) IVPB 750 mg/150 ml premix  Status:  Discontinued     750 mg 150 mL/hr over 60 Minutes Intravenous Every 12 hours 05/25/15 0943 05/26/15 1245   05/24/15 1015  dolutegravir (TIVICAY) tablet 50 mg     50 mg Oral Daily 05/24/15 1002     05/24/15 1015  emtricitabine-tenofovir AF (DESCOVY) 200-25 MG per tablet 1 tablet     1 tablet Oral Daily 05/24/15 1002     05/23/15 2200  vancomycin (VANCOCIN) IVPB 750 mg/150 ml premix  Status:  Discontinued     750 mg 150 mL/hr over 60 Minutes Intravenous Every 8 hours 05/23/15 1633 05/25/15 0901   05/23/15 2200  piperacillin-tazobactam (ZOSYN) IVPB 3.375 g  Status:  Discontinued     3.375 g 12.5 mL/hr over 240 Minutes Intravenous 3 times per day 05/23/15 1633 05/25/15 1752   05/23/15 1515  vancomycin (VANCOCIN) IVPB 1000 mg/200 mL premix     1,000 mg 200 mL/hr over 60 Minutes Intravenous  Once 05/23/15 1502 05/23/15 1700   05/23/15 1515  piperacillin-tazobactam (ZOSYN) IVPB 3.375 g     3.375 g 100 mL/hr over 30 Minutes Intravenous  Once 05/23/15 1502 05/23/15 1636      Assessment: 69yo male with 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  Gent trough 11/6 indicating pt clearing drug on current dose of  IV q48.  Cr remains < 1 (though not indicative of renal status given quadriplegic state) and good UOP.  Plan is to continue Gentamicin through 11/10.  Goal of Therapy:  Gentamicin trough  < 1  Plan:  Continue Gentamicin  IV q48 Watch renal fxn  Christoper Fabian, PharmD, BCPS Clinical pharmacist, pager [phone] 06/28/2015 12:45  AM

## 2015-06-29 LAB — GLUCOSE, CAPILLARY
GLUCOSE-CAPILLARY: 111 mg/dL — AB (ref 65–99)
GLUCOSE-CAPILLARY: 111 mg/dL — AB (ref 65–99)
GLUCOSE-CAPILLARY: 152 mg/dL — AB (ref 65–99)
Glucose-Capillary: 136 mg/dL — ABNORMAL HIGH (ref 65–99)
Glucose-Capillary: 106 mg/dL — ABNORMAL HIGH (ref 65–99)
Glucose-Capillary: 143 mg/dL — ABNORMAL HIGH (ref 65–99)

## 2015-10-05 ENCOUNTER — Emergency Department (HOSPITAL_COMMUNITY): Payer: Medicare Other

## 2015-10-05 ENCOUNTER — Inpatient Hospital Stay (HOSPITAL_COMMUNITY)
Admission: EM | Admit: 2015-10-05 | Discharge: 2015-11-25 | DRG: 207 | Disposition: A | Discharge status: Skilled Nursing Facility | Payer: Medicare Other | Attending: Oncology | Admitting: Oncology

## 2015-10-05 ENCOUNTER — Emergency Department (HOSPITAL_COMMUNITY): Admit: 2015-10-05 | Payer: Medicare Other

## 2015-10-05 ENCOUNTER — Encounter (HOSPITAL_COMMUNITY): Payer: Self-pay

## 2015-10-05 DIAGNOSIS — T4275XA Adverse effect of unspecified antiepileptic and sedative-hypnotic drugs, initial encounter: Secondary | ICD-10-CM | POA: Diagnosis not present

## 2015-10-05 DIAGNOSIS — G8250 Quadriplegia, unspecified: Secondary | ICD-10-CM | POA: Diagnosis present

## 2015-10-05 DIAGNOSIS — Z7984 Long term (current) use of oral hypoglycemic drugs: Secondary | ICD-10-CM | POA: Diagnosis not present

## 2015-10-05 DIAGNOSIS — Z930 Tracheostomy status: Secondary | ICD-10-CM | POA: Diagnosis not present

## 2015-10-05 DIAGNOSIS — R600 Localized edema: Secondary | ICD-10-CM | POA: Diagnosis present

## 2015-10-05 DIAGNOSIS — Y95 Nosocomial condition: Secondary | ICD-10-CM | POA: Diagnosis present

## 2015-10-05 DIAGNOSIS — G9340 Encephalopathy, unspecified: Secondary | ICD-10-CM | POA: Diagnosis not present

## 2015-10-05 DIAGNOSIS — Z79899 Other long term (current) drug therapy: Secondary | ICD-10-CM

## 2015-10-05 DIAGNOSIS — M7989 Other specified soft tissue disorders: Secondary | ICD-10-CM | POA: Diagnosis present

## 2015-10-05 DIAGNOSIS — A419 Sepsis, unspecified organism: Secondary | ICD-10-CM | POA: Diagnosis not present

## 2015-10-05 DIAGNOSIS — A498 Other bacterial infections of unspecified site: Secondary | ICD-10-CM | POA: Diagnosis not present

## 2015-10-05 DIAGNOSIS — Z9981 Dependence on supplemental oxygen: Secondary | ICD-10-CM | POA: Diagnosis not present

## 2015-10-05 DIAGNOSIS — J95851 Ventilator associated pneumonia: Secondary | ICD-10-CM | POA: Diagnosis present

## 2015-10-05 DIAGNOSIS — L89159 Pressure ulcer of sacral region, unspecified stage: Secondary | ICD-10-CM | POA: Diagnosis present

## 2015-10-05 DIAGNOSIS — Z87891 Personal history of nicotine dependence: Secondary | ICD-10-CM | POA: Diagnosis not present

## 2015-10-05 DIAGNOSIS — R609 Edema, unspecified: Secondary | ICD-10-CM | POA: Diagnosis not present

## 2015-10-05 DIAGNOSIS — R0902 Hypoxemia: Secondary | ICD-10-CM

## 2015-10-05 DIAGNOSIS — E1143 Type 2 diabetes mellitus with diabetic autonomic (poly)neuropathy: Secondary | ICD-10-CM | POA: Diagnosis present

## 2015-10-05 DIAGNOSIS — Z21 Asymptomatic human immunodeficiency virus [HIV] infection status: Secondary | ICD-10-CM | POA: Diagnosis not present

## 2015-10-05 DIAGNOSIS — Z66 Do not resuscitate: Secondary | ICD-10-CM | POA: Diagnosis present

## 2015-10-05 DIAGNOSIS — E119 Type 2 diabetes mellitus without complications: Secondary | ICD-10-CM

## 2015-10-05 DIAGNOSIS — R40241 Glasgow coma scale score 13-15, unspecified time: Secondary | ICD-10-CM | POA: Diagnosis not present

## 2015-10-05 DIAGNOSIS — Y848 Other medical procedures as the cause of abnormal reaction of the patient, or of later complication, without mention of misadventure at the time of the procedure: Secondary | ICD-10-CM | POA: Diagnosis not present

## 2015-10-05 DIAGNOSIS — F329 Major depressive disorder, single episode, unspecified: Secondary | ICD-10-CM | POA: Diagnosis present

## 2015-10-05 DIAGNOSIS — J151 Pneumonia due to Pseudomonas: Secondary | ICD-10-CM | POA: Diagnosis present

## 2015-10-05 DIAGNOSIS — L89153 Pressure ulcer of sacral region, stage 3: Secondary | ICD-10-CM | POA: Diagnosis present

## 2015-10-05 DIAGNOSIS — J9610 Chronic respiratory failure, unspecified whether with hypoxia or hypercapnia: Secondary | ICD-10-CM

## 2015-10-05 DIAGNOSIS — J9690 Respiratory failure, unspecified, unspecified whether with hypoxia or hypercapnia: Secondary | ICD-10-CM | POA: Diagnosis present

## 2015-10-05 DIAGNOSIS — Z931 Gastrostomy status: Secondary | ICD-10-CM

## 2015-10-05 DIAGNOSIS — T17890A Other foreign object in other parts of respiratory tract causing asphyxiation, initial encounter: Secondary | ICD-10-CM | POA: Diagnosis not present

## 2015-10-05 DIAGNOSIS — L89310 Pressure ulcer of right buttock, unstageable: Secondary | ICD-10-CM | POA: Diagnosis not present

## 2015-10-05 DIAGNOSIS — D638 Anemia in other chronic diseases classified elsewhere: Secondary | ICD-10-CM | POA: Diagnosis not present

## 2015-10-05 DIAGNOSIS — B965 Pseudomonas (aeruginosa) (mallei) (pseudomallei) as the cause of diseases classified elsewhere: Secondary | ICD-10-CM | POA: Diagnosis present

## 2015-10-05 DIAGNOSIS — Z794 Long term (current) use of insulin: Secondary | ICD-10-CM | POA: Diagnosis not present

## 2015-10-05 DIAGNOSIS — K5909 Other constipation: Secondary | ICD-10-CM | POA: Diagnosis present

## 2015-10-05 DIAGNOSIS — R0689 Other abnormalities of breathing: Secondary | ICD-10-CM | POA: Insufficient documentation

## 2015-10-05 DIAGNOSIS — Z1624 Resistance to multiple antibiotics: Secondary | ICD-10-CM | POA: Diagnosis present

## 2015-10-05 DIAGNOSIS — E876 Hypokalemia: Secondary | ICD-10-CM | POA: Diagnosis not present

## 2015-10-05 DIAGNOSIS — Z9911 Dependence on respirator [ventilator] status: Secondary | ICD-10-CM | POA: Diagnosis not present

## 2015-10-05 DIAGNOSIS — R401 Stupor: Secondary | ICD-10-CM | POA: Diagnosis not present

## 2015-10-05 DIAGNOSIS — E1165 Type 2 diabetes mellitus with hyperglycemia: Secondary | ICD-10-CM | POA: Diagnosis present

## 2015-10-05 DIAGNOSIS — I10 Essential (primary) hypertension: Secondary | ICD-10-CM | POA: Diagnosis not present

## 2015-10-05 DIAGNOSIS — L89154 Pressure ulcer of sacral region, stage 4: Secondary | ICD-10-CM | POA: Diagnosis not present

## 2015-10-05 DIAGNOSIS — B20 Human immunodeficiency virus [HIV] disease: Secondary | ICD-10-CM | POA: Diagnosis present

## 2015-10-05 DIAGNOSIS — J9600 Acute respiratory failure, unspecified whether with hypoxia or hypercapnia: Secondary | ICD-10-CM

## 2015-10-05 DIAGNOSIS — J189 Pneumonia, unspecified organism: Secondary | ICD-10-CM | POA: Diagnosis not present

## 2015-10-05 DIAGNOSIS — R509 Fever, unspecified: Secondary | ICD-10-CM | POA: Diagnosis not present

## 2015-10-05 DIAGNOSIS — G92 Toxic encephalopathy: Secondary | ICD-10-CM | POA: Diagnosis present

## 2015-10-05 DIAGNOSIS — R51 Headache: Secondary | ICD-10-CM | POA: Diagnosis not present

## 2015-10-05 DIAGNOSIS — R400 Somnolence: Secondary | ICD-10-CM | POA: Diagnosis not present

## 2015-10-05 DIAGNOSIS — L8990 Pressure ulcer of unspecified site, unspecified stage: Secondary | ICD-10-CM | POA: Diagnosis present

## 2015-10-05 DIAGNOSIS — Z8619 Personal history of other infectious and parasitic diseases: Secondary | ICD-10-CM | POA: Diagnosis not present

## 2015-10-05 DIAGNOSIS — J9601 Acute respiratory failure with hypoxia: Secondary | ICD-10-CM | POA: Diagnosis not present

## 2015-10-05 DIAGNOSIS — R601 Generalized edema: Secondary | ICD-10-CM | POA: Diagnosis not present

## 2015-10-05 DIAGNOSIS — L89150 Pressure ulcer of sacral region, unstageable: Secondary | ICD-10-CM

## 2015-10-05 DIAGNOSIS — J9621 Acute and chronic respiratory failure with hypoxia: Secondary | ICD-10-CM | POA: Diagnosis not present

## 2015-10-05 DIAGNOSIS — J181 Lobar pneumonia, unspecified organism: Secondary | ICD-10-CM

## 2015-10-05 DIAGNOSIS — Z1630 Resistance to unspecified antimicrobial drugs: Secondary | ICD-10-CM | POA: Diagnosis not present

## 2015-10-05 DIAGNOSIS — D649 Anemia, unspecified: Secondary | ICD-10-CM

## 2015-10-05 DIAGNOSIS — R4182 Altered mental status, unspecified: Secondary | ICD-10-CM

## 2015-10-05 DIAGNOSIS — J9611 Chronic respiratory failure with hypoxia: Secondary | ICD-10-CM | POA: Diagnosis not present

## 2015-10-05 DIAGNOSIS — B957 Other staphylococcus as the cause of diseases classified elsewhere: Secondary | ICD-10-CM | POA: Diagnosis not present

## 2015-10-05 HISTORY — DX: Neuromuscular dysfunction of bladder, unspecified: N31.9

## 2015-10-05 HISTORY — DX: Acute respiratory failure, unspecified whether with hypoxia or hypercapnia: J96.00

## 2015-10-05 HISTORY — DX: Unspecified infectious disease: B99.9

## 2015-10-05 HISTORY — DX: Dysphagia, oropharyngeal phase: R13.12

## 2015-10-05 HISTORY — DX: Human immunodeficiency virus (HIV) disease: B20

## 2015-10-05 HISTORY — DX: Acute upper respiratory infection, unspecified: J06.9

## 2015-10-05 HISTORY — DX: Dependence on respirator (ventilator) status: Z99.11

## 2015-10-05 HISTORY — DX: Pleural effusion, not elsewhere classified: J90

## 2015-10-05 HISTORY — DX: Toxic encephalopathy: G92

## 2015-10-05 HISTORY — DX: Major depressive disorder, single episode, unspecified: F32.9

## 2015-10-05 HISTORY — DX: Adjustment disorder with depressed mood: F43.21

## 2015-10-05 HISTORY — DX: Abdominal distension (gaseous): R14.0

## 2015-10-05 HISTORY — DX: Insomnia, unspecified: G47.00

## 2015-10-05 HISTORY — DX: Anxiety disorder, unspecified: F41.9

## 2015-10-05 HISTORY — DX: Other chronic pain: G89.29

## 2015-10-05 HISTORY — DX: Gastro-esophageal reflux disease without esophagitis: K21.9

## 2015-10-05 HISTORY — DX: Gastroparesis: K31.84

## 2015-10-05 HISTORY — DX: Pneumonia, unspecified organism: J18.9

## 2015-10-05 HISTORY — DX: Hypokalemia: E87.6

## 2015-10-05 HISTORY — DX: Post-traumatic stress disorder, unspecified: F43.10

## 2015-10-05 HISTORY — DX: Unspecified toxic encephalopathy: G92.9

## 2015-10-05 HISTORY — DX: Anemia, unspecified: D64.9

## 2015-10-05 LAB — CBC
HCT: 27.8 % — ABNORMAL LOW (ref 39.0–52.0)
HEMATOCRIT: 23.6 % — AB (ref 39.0–52.0)
HEMOGLOBIN: 7.1 g/dL — AB (ref 13.0–17.0)
HEMOGLOBIN: 8 g/dL — AB (ref 13.0–17.0)
MCH: 25.4 pg — AB (ref 26.0–34.0)
MCH: 24.1 pg — ABNORMAL LOW (ref 26.0–34.0)
MCHC: 30.1 g/dL (ref 30.0–36.0)
MCHC: 28.8 g/dL — AB (ref 30.0–36.0)
MCV: 84.6 fL (ref 78.0–100.0)
MCV: 83.7 fL (ref 78.0–100.0)
PLATELETS: 138 10*3/uL — AB (ref 150–400)
Platelets: 154 10*3/uL (ref 150–400)
RBC: 2.79 MIL/uL — ABNORMAL LOW (ref 4.22–5.81)
RBC: 3.32 MIL/uL — ABNORMAL LOW (ref 4.22–5.81)
RDW: 16.7 % — AB (ref 11.5–15.5)
RDW: 16.6 % — AB (ref 11.5–15.5)
WBC: 7.3 10*3/uL (ref 4.0–10.5)
WBC: 5.9 10*3/uL (ref 4.0–10.5)

## 2015-10-05 LAB — IRON AND TIBC
Iron: 14 ug/dL — ABNORMAL LOW (ref 45–182)
SATURATION RATIOS: 7 % — AB (ref 17.9–39.5)
TIBC: 210 ug/dL — ABNORMAL LOW (ref 250–450)
UIBC: 196 ug/dL

## 2015-10-05 LAB — I-STAT ARTERIAL BLOOD GAS, ED
ACID-BASE EXCESS: 11 mmol/L — AB (ref 0.0–2.0)
Bicarbonate: 36.9 mEq/L — ABNORMAL HIGH (ref 20.0–24.0)
O2 SAT: 97 %
PCO2 ART: 55.6 mmHg — AB (ref 35.0–45.0)
TCO2: 39 mmol/L (ref 0–100)
pH, Arterial: 7.429 (ref 7.350–7.450)
pO2, Arterial: 88 mmHg (ref 80.0–100.0)

## 2015-10-05 LAB — DIFFERENTIAL
Basophils Absolute: 0 10*3/uL (ref 0.0–0.1)
Basophils Relative: 0 %
Eosinophils Absolute: 0.1 10*3/uL (ref 0.0–0.7)
Eosinophils Relative: 1 %
Lymphocytes Relative: 11 %
Lymphs Abs: 0.8 10*3/uL (ref 0.7–4.0)
MONO ABS: 0.5 10*3/uL (ref 0.1–1.0)
Monocytes Relative: 7 %
NEUTROS ABS: 6.1 10*3/uL (ref 1.7–7.7)
NEUTROS PCT: 81 %

## 2015-10-05 LAB — FERRITIN: FERRITIN: 179 ng/mL (ref 24–336)

## 2015-10-05 LAB — COMPREHENSIVE METABOLIC PANEL
ALBUMIN: 2.2 g/dL — AB (ref 3.5–5.0)
ALT: 20 U/L (ref 17–63)
ANION GAP: 11 (ref 5–15)
AST: 13 U/L — AB (ref 15–41)
Alkaline Phosphatase: 59 U/L (ref 38–126)
BILIRUBIN TOTAL: 0.2 mg/dL — AB (ref 0.3–1.2)
BUN: 30 mg/dL — AB (ref 6–20)
CO2: 32 mmol/L (ref 22–32)
Calcium: 9.1 mg/dL (ref 8.9–10.3)
Chloride: 96 mmol/L — ABNORMAL LOW (ref 101–111)
Creatinine, Ser: 0.66 mg/dL (ref 0.61–1.24)
GFR calc Af Amer: >60 mL/min (ref >60)
GFR calc non Af Amer: >60 mL/min (ref >60)
GLUCOSE: 298 mg/dL — AB (ref 65–99)
POTASSIUM: 4.3 mmol/L (ref 3.5–5.1)
Sodium: 139 mmol/L (ref 135–145)
Total Protein: 5.9 g/dL — ABNORMAL LOW (ref 6.5–8.1)

## 2015-10-05 LAB — APTT: APTT: 23 s — AB (ref 24–37)

## 2015-10-05 LAB — URINALYSIS, ROUTINE W REFLEX MICROSCOPIC
Bilirubin Urine: NEGATIVE
Glucose, UA: >1000 mg/dL — AB
Hgb urine dipstick: NEGATIVE
Ketones, ur: NEGATIVE mg/dL
Nitrite: NEGATIVE
PROTEIN: 30 mg/dL — AB
Specific Gravity, Urine: 1.026 (ref 1.005–1.030)
pH: 5.5 (ref 5.0–8.0)

## 2015-10-05 LAB — ABO/RH: ABO/RH(D): A POS

## 2015-10-05 LAB — RETICULOCYTES
RBC.: 3.32 MIL/uL — AB (ref 4.22–5.81)
RETIC CT PCT: 0.9 % (ref 0.4–3.1)
Retic Count, Absolute: 29.9 10*3/uL (ref 19.0–186.0)

## 2015-10-05 LAB — TSH: TSH: 2.454 u[IU]/mL (ref 0.350–4.500)

## 2015-10-05 LAB — URINE MICROSCOPIC-ADD ON: RBC / HPF: NONE SEEN RBC/hpf (ref 0–5)

## 2015-10-05 LAB — MAGNESIUM: Magnesium: 2 mg/dL (ref 1.7–2.4)

## 2015-10-05 LAB — PROTIME-INR
INR: 1.17 (ref 0.00–1.49)
Prothrombin Time: 15.1 seconds (ref 11.6–15.2)

## 2015-10-05 LAB — AMMONIA: AMMONIA: 34 umol/L (ref 9–35)

## 2015-10-05 LAB — TECHNOLOGIST SMEAR REVIEW

## 2015-10-05 LAB — I-STAT CG4 LACTIC ACID, ED: Lactic Acid, Venous: 1.09 mmol/L (ref 0.5–2.0)

## 2015-10-05 LAB — PROCALCITONIN: Procalcitonin: 0.34 ng/mL

## 2015-10-05 LAB — PHOSPHORUS: Phosphorus: 1.4 mg/dL — ABNORMAL LOW (ref 2.5–4.6)

## 2015-10-05 MED ORDER — ALBUTEROL SULFATE (2.5 MG/3ML) 0.083% IN NEBU
2.5000 mg | INHALATION_SOLUTION | Freq: Three times a day (TID) | RESPIRATORY_TRACT | Status: DC
  Administered 2015-10-05 – 2015-10-24 (×56): 2.5 mg via RESPIRATORY_TRACT
  Filled 2015-10-05 (×56): qty 3

## 2015-10-05 MED ORDER — BACLOFEN 10 MG PO TABS
10.0000 mg | ORAL_TABLET | Freq: Four times a day (QID) | ORAL | Status: DC
  Administered 2015-10-06 – 2015-11-25 (×201): 10 mg
  Filled 2015-10-05 (×208): qty 1

## 2015-10-05 MED ORDER — SODIUM CHLORIDE 0.9% FLUSH
3.0000 mL | Freq: Two times a day (BID) | INTRAVENOUS | Status: DC
  Administered 2015-10-05 – 2015-11-19 (×42): 3 mL via INTRAVENOUS
  Administered 2015-11-20: 10 mL via INTRAVENOUS
  Administered 2015-11-21 – 2015-11-25 (×5): 3 mL via INTRAVENOUS

## 2015-10-05 MED ORDER — SODIUM CHLORIDE 0.9 % IV SOLN
1.0000 g | Freq: Three times a day (TID) | INTRAVENOUS | Status: DC
  Administered 2015-10-06 – 2015-10-12 (×20): 1 g via INTRAVENOUS
  Filled 2015-10-05 (×22): qty 1

## 2015-10-05 MED ORDER — ONDANSETRON HCL 4 MG PO TABS: 4.0000 mg | ORAL_TABLET | Freq: Four times a day (QID) | ORAL | Status: DC | PRN

## 2015-10-05 MED ORDER — VANCOMYCIN HCL IN DEXTROSE 1-5 GM/200ML-% IV SOLN
1000.0000 mg | Freq: Once | INTRAVENOUS | Status: DC
  Administered 2015-10-05: 1000 mg via INTRAVENOUS
  Filled 2015-10-05: qty 200

## 2015-10-05 MED ORDER — DOLUTEGRAVIR SODIUM 50 MG PO TABS
50.0000 mg | ORAL_TABLET | Freq: Every day | ORAL | Status: DC
  Administered 2015-10-06 – 2015-11-25 (×52): 50 mg via ORAL
  Filled 2015-10-05 (×88): qty 1

## 2015-10-05 MED ORDER — ENOXAPARIN SODIUM 40 MG/0.4ML ~~LOC~~ SOLN
40.0000 mg | Freq: Every day | SUBCUTANEOUS | Status: DC
  Administered 2015-10-06 – 2015-11-25 (×51): 40 mg via SUBCUTANEOUS
  Filled 2015-10-05 (×51): qty 0.4

## 2015-10-05 MED ORDER — BISACODYL 10 MG RE SUPP: 10.0000 mg | Freq: Every day | RECTAL | Status: DC

## 2015-10-05 MED ORDER — ALTEPLASE 2 MG IJ SOLR
2.0000 mg | Freq: Once | INTRAMUSCULAR | Status: AC
  Administered 2015-10-23: 2 mg
  Filled 2015-10-05 (×3): qty 2

## 2015-10-05 MED ORDER — INSULIN ASPART 100 UNIT/ML ~~LOC~~ SOLN
0.0000 [IU] | Freq: Three times a day (TID) | SUBCUTANEOUS | Status: DC
  Administered 2015-10-06: 3 [IU] via SUBCUTANEOUS
  Administered 2015-10-07 (×2): 2 [IU] via SUBCUTANEOUS
  Administered 2015-10-07: 3 [IU] via SUBCUTANEOUS
  Administered 2015-10-08 (×2): 5 [IU] via SUBCUTANEOUS
  Administered 2015-10-08 – 2015-10-09 (×2): 3 [IU] via SUBCUTANEOUS
  Administered 2015-10-09 – 2015-10-10 (×3): 5 [IU] via SUBCUTANEOUS

## 2015-10-05 MED ORDER — ACETAMINOPHEN 650 MG RE SUPP: 650.0000 mg | Freq: Four times a day (QID) | RECTAL | Status: DC | PRN

## 2015-10-05 MED ORDER — DULOXETINE HCL 60 MG PO CPEP
60.0000 mg | ORAL_CAPSULE | Freq: Every day | ORAL | Status: DC
  Administered 2015-10-07 – 2015-11-25 (×49): 60 mg via ORAL
  Filled 2015-10-05 (×52): qty 1

## 2015-10-05 MED ORDER — CLONAZEPAM 1 MG PO TABS
1.0000 mg | ORAL_TABLET | Freq: Four times a day (QID) | ORAL | Status: DC
  Administered 2015-10-06 – 2015-11-25 (×197): 1 mg
  Filled 2015-10-05 (×201): qty 1

## 2015-10-05 MED ORDER — PANTOPRAZOLE SODIUM 40 MG IV SOLR
40.0000 mg | Freq: Every day | INTRAVENOUS | Status: DC
  Administered 2015-10-06: 40 mg via INTRAVENOUS
  Filled 2015-10-05: qty 40

## 2015-10-05 MED ORDER — INSULIN GLARGINE 100 UNIT/ML ~~LOC~~ SOLN
15.0000 [IU] | Freq: Every day | SUBCUTANEOUS | Status: DC
  Administered 2015-10-06 – 2015-10-07 (×3): 15 [IU] via SUBCUTANEOUS
  Filled 2015-10-05 (×5): qty 0.15

## 2015-10-05 MED ORDER — ONDANSETRON HCL 4 MG/2ML IJ SOLN: 4.0000 mg | Freq: Four times a day (QID) | INTRAMUSCULAR | Status: DC | PRN

## 2015-10-05 MED ORDER — QUETIAPINE FUMARATE 50 MG PO TABS
50.0000 mg | ORAL_TABLET | Freq: Every day | ORAL | Status: DC
  Administered 2015-10-06 – 2015-11-24 (×51): 50 mg
  Filled 2015-10-05 (×51): qty 1

## 2015-10-05 MED ORDER — PIPERACILLIN-TAZOBACTAM 3.375 G IVPB 30 MIN
3.3750 g | Freq: Once | INTRAVENOUS | Status: AC
  Administered 2015-10-05: 3.375 g via INTRAVENOUS
  Filled 2015-10-05: qty 50

## 2015-10-05 MED ORDER — EMTRICITABINE-TENOFOVIR AF 200-25 MG PO TABS
1.0000 | ORAL_TABLET | Freq: Every day | ORAL | Status: DC
  Administered 2015-10-06 – 2015-11-25 (×52): 1
  Filled 2015-10-05 (×53): qty 1

## 2015-10-05 MED ORDER — SENNOSIDES-DOCUSATE SODIUM 8.6-50 MG PO TABS
1.0000 | ORAL_TABLET | Freq: Every evening | ORAL | Status: DC | PRN
  Filled 2015-10-05: qty 1

## 2015-10-05 MED ORDER — FUROSEMIDE 10 MG/ML IJ SOLN
40.0000 mg | Freq: Three times a day (TID) | INTRAMUSCULAR | Status: AC
  Administered 2015-10-06 (×2): 40 mg via INTRAVENOUS
  Filled 2015-10-05 (×2): qty 4

## 2015-10-05 MED ORDER — SODIUM PHOSPHATE 3 MMOLE/ML IV SOLN
40.0000 meq | Freq: Once | INTRAVENOUS | Status: AC
  Administered 2015-10-06: 40 meq via INTRAVENOUS
  Filled 2015-10-05: qty 10

## 2015-10-05 MED ORDER — ALBUTEROL SULFATE (2.5 MG/3ML) 0.083% IN NEBU: 2.5000 mg | INHALATION_SOLUTION | RESPIRATORY_TRACT | Status: DC | PRN

## 2015-10-05 MED ORDER — HYPROMELLOSE (GONIOSCOPIC) 2.5 % OP SOLN
2.0000 [drp] | Freq: Four times a day (QID) | OPHTHALMIC | Status: DC
  Administered 2015-10-06 – 2015-11-07 (×71): 2 [drp] via OPHTHALMIC
  Filled 2015-10-05 (×2): qty 15

## 2015-10-05 MED ORDER — ACETAMINOPHEN 325 MG PO TABS
650.0000 mg | ORAL_TABLET | Freq: Four times a day (QID) | ORAL | Status: DC | PRN
  Administered 2015-10-06 – 2015-10-12 (×2): 650 mg
  Filled 2015-10-05 (×3): qty 2

## 2015-10-05 NOTE — ED Notes (Signed)
Visitor at bedside.

## 2015-10-05 NOTE — ED Notes (Signed)
Iv team at the bedside 

## 2015-10-05 NOTE — ED Notes (Signed)
Patient placed on monitor, continuous pulse oximetry and blood pressure cuff; RT maintaining airway

## 2015-10-05 NOTE — ED Notes (Signed)
Report called to rn on 2h 

## 2015-10-05 NOTE — Progress Notes (Signed)
Pharmacy Antibiotic Note  Anthony Avila is a 70 y.o. male admitted on 10/05/2015 with pneumonia.  Pharmacy has been consulted for meropenem dosing. Pt is afebrile and WBC is WNL. SCr is WNL but pt is quadriplegic which makes renal function difficult to assess. Per MD, pt had a pseudomonas pneumonia with cultures sensitive to meropenem on 09/23/15.   Plan: - Meropenem 1gm IV Q8H - F/u renal fxn, C&S, clinical status     Temp (24hrs), Avg:97.8 F (36.6 C), Min:97.8 F (36.6 C), Max:97.8 F (36.6 C)   Recent Labs Lab 10/05/15 1616 10/05/15 1639 10/05/15 2152  WBC 7.3  --  5.9  CREATININE 0.66  --   --   LATICACIDVEN  --  1.09  --     CrCl cannot be calculated (Unknown ideal weight.).    No Known Allergies  Antimicrobials this admission: Vanc x 1 2/14 (not yet charted) Zosyn x 1 2/14 Meropenem 2/14>>  Dose adjustments this admission: N/A  Microbiology results: Pending  Thank you for allowing pharmacy to be a part of this patient's care.  Britanni Yarde, Drake Leach 10/05/2015 10:09 PM

## 2015-10-05 NOTE — ED Provider Notes (Signed)
CSN: [id number]     Arrival date & time 10/05/15  1328 History   First MD Initiated Contact with Patient 10/05/15 1440     Chief Complaint  Patient presents with  . Altered Mental Status     (Consider location/radiation/quality/duration/timing/severity/associated sxs/prior Treatment) Patient is a 70 y.o. male presenting with altered mental status. The history is provided by a relative. The history is limited by the condition of the patient (Altered mental status).  Altered Mental Status He is a resident at [facility] and on chronic ventilator treatment for quadriplegia. Son is noted mental status change over the last 2 weeks with decreased responsiveness and more somnolence as well as some increase in swelling in his arms and abdomen. Son states that he has been getting more fluid over the last 2 weeks. He is concerned that he patient is not receiving proper care at [facility] and has brought him here for a second opinion. He has already been seen by Dr. Molli Knock of pulmonary critical care service and has made the patient DO NOT RESUSCITATE.  Past Medical History  Diagnosis Date  . Diabetes mellitus without complication (HCC)   . HIV disease (HCC)   . Hypertension   . Reflux   . Depressed   . Quadriplegia (HCC)   . Stage IV pressure ulcer (HCC)   . Dysphagia, oropharyngeal phase   . 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 Acute respiratory failure (HCC)   . Quadriplegia (HCC)   . Neuromuscular dysfunction of bladder   . Acute URI   . Anemia   . Toxic encephalopathy   . Pleural effusion   . Gastroparesis   . Hypokalemia   . Major depressive disorder (HCC)   . Anxiety disorder   . PTSD (post-traumatic stress disorder)   . Adjustment disorder with depressed mood   . Insomnia   . Chronic pain   . Pneumonia   . GERD without esophagitis   . Abdominal distention   . Infectious and parasitic disease   . HIV (human immunodeficiency virus infection) [facility])     Past Surgical History  Procedure Laterality Date  . Tracheostomy    . Cervical spine surgery    . Gastrostomy     No family history on file. Social History  Substance Use Topics  . Smoking status: Former Smoker    Quit date: 01/02/2015  . Smokeless tobacco: None  . Alcohol Use: No    Review of Systems  Unable to perform ROS: Mental status change      Allergies  Review of patient's allergies indicates no known allergies.  Home Medications   Prior to Admission medications   Medication Sig Start Date End Date Taking? Authorizing Provider  acetaminophen (TYLENOL) 325 MG tablet Place 2 tablets (650 mg total) into feeding tube every 4 (four) hours as needed for mild pain, fever or headache. 06/28/15   Joseph Art, DO  albuterol (PROVENTIL) (2.5 MG/3ML) 0.083% nebulizer solution Take 3 mLs (2.5 mg total) by nebulization 3 (three) times daily. 06/28/15   Joseph Art, DO  ALPRAZolam (XANAX) 0.5 MG tablet Take 1 tablet (0.5 mg total) by mouth 3 (three) times daily as needed for anxiety. 06/28/15   Joseph Art, DO  antiseptic oral rinse (BIOTENE) LIQD 5 mLs by Mouth Rinse route every 4 (four) hours. While awake for dry mouth    Historical Provider, MD  baclofen (LIORESAL) 10 MG tablet Take 1 tablet (10 mg total) by mouth 4 (  four) times daily. 06/28/15   Joseph Art, DO  bisacodyl (DULCOLAX) 10 MG suppository Place 1 suppository (10 mg total) rectally daily. 05/31/15   Jeanella Craze, NP  dolutegravir (TIVICAY) 50 MG tablet Place 50 mg into feeding tube daily.    Historical Provider, MD  DULoxetine (CYMBALTA) 30 MG capsule Take 1 capsule (30 mg total) by mouth daily. 06/28/15   Joseph Art, DO  emtricitabine-tenofovir (TRUVADA) 200-300 MG tablet Place 1 tablet into feeding tube daily.    Historical Provider, MD  enoxaparin (LOVENOX) 30 MG/0.3ML injection Inject 30 mg into the skin every 12 (twelve) hours.    Historical Provider, MD  fentaNYL (DURAGESIC - DOSED MCG/HR) 100  MCG/HR Place 1 patch (100 mcg total) onto the skin every 3 (three) days. 06/28/15   Joseph Art, DO  furosemide (LASIX) 20 MG tablet Place 20 mg into feeding tube daily.    Historical Provider, MD  gentamicin 600 mg in dextrose 5 % 100 mL Inject 600 mg into the vein every other day. 06/28/15   Joseph Art, DO  glucagon (GLUCAGEN) 1 MG SOLR injection Inject 1 mg into the muscle once as needed for low blood sugar.    Historical Provider, MD  guaifenesin (ROBITUSSIN) 100 MG/5ML syrup Take 200 mg by mouth 4 (four) times daily as needed for cough.    Historical Provider, MD  hydroxypropyl methylcellulose / hypromellose (ISOPTO TEARS / GONIOVISC) 2.5 % ophthalmic solution Place 2 drops into both eyes 4 (four) times daily. For dry eyes while awake    Historical Provider, MD  insulin aspart (NOVOLOG FLEXPEN) 100 UNIT/ML FlexPen Inject 0-20 Units into the skin every 6 (six) hours. If blood glucose 0-150 = 0 units, 151-200 = 3 units, 201-300 = 5 units, 301-400 = 10 units, 401-500 = 15 greater than 500 give 20 units with repeat blood sugar every hour and coverage with his sliding scale until CBG <200, subcutaneously every 6 hours for DM.    Historical Provider, MD  insulin glargine (LANTUS) 100 UNIT/ML injection Inject 0.18 mLs (18 Units total) into the skin at bedtime. 06/28/15   Joseph Art, DO  ipratropium-albuterol (DUONEB) 0.5-2.5 (3) MG/3ML SOLN Take 3 mLs by nebulization every 6 (six) hours.    Historical Provider, MD  lactulose (CHRONULAC) 10 GM/15ML solution Place 20 g into feeding tube every other day.    Historical Provider, MD  levofloxacin (LEVAQUIN) 750 MG/150ML SOLN Inject 150 mLs (750 mg total) into the vein daily. 06/28/15   Joseph Art, DO  Multiple Vitamin (MULTIVITAMIN) LIQD Take 5 mLs by mouth daily.    Historical Provider, MD  Nutritional Supplements (FEEDING SUPPLEMENT, VITAL AF 1.2 CAL,) LIQD Rate:  75 ml/hr per tube. 05/31/15   Jeanella Craze, NP  oxyCODONE-acetaminophen  (PERCOCET/ROXICET) 5-325 MG tablet Place 1 tablet into feeding tube every 8 (eight) hours as needed for severe pain. 06/28/15   Joseph Art, DO  pantoprazole sodium (PROTONIX) 40 mg/20 mL PACK Place 40 mg into feeding tube 2 (two) times daily.    Historical Provider, MD  potassium & sodium phosphates (PHOS-NAK) 280-160-250 MG PACK Place 1 packet into feeding tube every 12 (twelve) hours.    Historical Provider, MD  potassium chloride SA (K-DUR,KLOR-CON) 20 MEQ tablet Place 20 mEq into feeding tube daily.    Historical Provider, MD  propantheline (PROBANTHINE) 15 MG tablet Place 15 mg into feeding tube every 6 (six) hours.    Historical Provider, MD  Protein POWD  Place 1 scoop into feeding tube every 6 (six) hours.    Historical Provider, MD  QUEtiapine (SEROQUEL) 50 MG tablet Take 1 tablet (50 mg total) by mouth at bedtime. 06/28/15   Joseph Art, DO  sennosides (SENOKOT) 8.8 MG/5ML syrup Place 5 mLs into feeding tube daily as needed for mild constipation.    Historical Provider, MD  simethicone (MYLICON) 80 MG chewable tablet Place 80 mg into feeding tube every 6 (six) hours.    Historical Provider, MD  vitamin C (ASCORBIC ACID) 500 MG tablet Place 500 mg into feeding tube daily.    Historical Provider, MD  Water For Irrigation, Sterile (FREE WATER) SOLN Place 100 mLs into feeding tube every 6 (six) hours. 06/28/15   Jessica U Vann, DO   BP 167/68 mmHg  Pulse 77  Temp(Src) 97.8 F (36.6 C) (Temporal)  Resp 22  SpO2 95% Physical Exam  Nursing note and vitals reviewed.  70 year old male, resting comfortably and in no acute distress. Vital signs are significant for hypertension and tachypnea. Oxygen saturation is 95%, which is normal. Head is normocephalic and atraumatic. PERRLA, EOMI. Oropharynx is clear. Neck is nontender and supple without adenopathy or JVD. Tracheostomy is in place. Lungs are clear without rales, wheezes, or rhonchi. Chest is nontender. Heart has regular rate and  rhythm with 2/6 harsh systolic murmur heard at the upper right sternal border. Abdomen is soft, flat, nontender without masses or hepatosplenomegaly and peristalsis is normoactive. Trace edema is present on the abdomen. Extremities: 1+ edema of legs and 2+ edema of arms. Skin is warm and dry. Sacral decubitus is present with dressing in place. Dressing is not removed. Neurologic: He is awake and intermittently responsive to verbal stimuli but does not answer questions consistently. Cranial nerves are intact. He is quadriplegic.  ED Course  Procedures (including critical care time) Labs Review Labs Reviewed  URINE CULTURE  CULTURE, BLOOD (ROUTINE X 2)  CULTURE, BLOOD (ROUTINE X 2)  COMPREHENSIVE METABOLIC PANEL  CBC  URINALYSIS, ROUTINE W REFLEX MICROSCOPIC (NOT AT [facility])  BLOOD GAS, ARTERIAL  DIFFERENTIAL  AMMONIA  CBG MONITORING, ED  I-STAT CG4 LACTIC ACID, ED    Imaging Review No results found. I have personally reviewed and evaluated these images and lab results as part of my medical decision-making.   EKG Interpretation   Date/Time:  Tuesday October 05 2015 13:47:14 EST Ventricular Rate:  86 PR Interval:  126 QRS Duration: 90 QT Interval:  352 QTC Calculation: 421 R Axis:   100 Text Interpretation:  Sinus rhythm Posterior infarct, old When compared  with ECG of 05/28/2015, No significant change was found Reconfirmed by  [facility]  MD, Kieron Kantner ([id number]) on 10/05/2015 3:27:35 PM      MDM   Final diagnoses:  Altered mental status, unspecified altered mental status type  HCAP (healthcare-associated pneumonia)  Quadriplegia (HCC)  Ventilator dependence (HCC)  Hypercarbia  Normocytic anemia  Sacral decubitus ulcer, unstageable (HCC)    Altered mental status of uncertain cause. He has potential sources of infection with indwelling Foley catheter and known sacral decubitus. Old records reviewed and he had hospitalization for months ago for healthcare associated pneumonia and  decreased mentation related to excess narcotic use. He will be screened for occult infection. Critical care consult is appreciated.  ED workup is significant for probable left lower lobe pneumonia. Venous Doppler studies pending at this time. He is started on antibiotics for healthcare associated pneumonia-vancomycin and Zosyn. Unfortunately, his PICC line appears to  have clotted and is not responding to declotting with TPA and it Will be placed. This is going to delay administration of antibiotics. However, he shows no signs of sepsis with hemodynamic stability and normal lactic acid. Case is discussed with Dr. Senaida Ores of internal medicine teaching service who agrees to admit the patient.  Dione Booze, MD 10/05/15 1911
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
## 2015-10-05 NOTE — Progress Notes (Signed)
eLink Physician-Brief Progress Note Patient Name: Anthony Avila DOB: May 04, 1946 MRN: [id number]   Date of Service  10/05/2015  HPI/Events of Note  Pain Patient wife refusing fentanyl  eICU Interventions  dilaudid     Intervention Category Intermediate Interventions: Pain - evaluation and management  Max Fickle 10/05/2015, 8:00 PM

## 2015-10-05 NOTE — ED Notes (Signed)
RT at the bedside suctioning patient  

## 2015-10-05 NOTE — ED Notes (Signed)
Per family, Pt started to decline last week. Family wanted pt to be seen and to have a second opinion about care. Per family, Patient has been at [facility] and patient was being worked with with PT. About a week ago, Family reports patient was no longer working with PT. Pt stopped talking. Pt started to have swelling bilaterally in hands that continued to get worse. Family reports swelling in his abdomen. When patient arrived to the ED, Pt is alert. Pt is oriented to person. Pt is a trach patient that is on a ventilator.

## 2015-10-05 NOTE — ED Notes (Signed)
Iv team rn reports that the p[icc line will be placed in the am   She has placed a peripheral line

## 2015-10-05 NOTE — ED Notes (Signed)
The pt is waiting for a new picc line.  He has antibiotics to be hung the edp  Said ok to wait for iv antibiotics

## 2015-10-06 ENCOUNTER — Inpatient Hospital Stay (HOSPITAL_COMMUNITY): Payer: Medicare Other

## 2015-10-06 DIAGNOSIS — E1165 Type 2 diabetes mellitus with hyperglycemia: Secondary | ICD-10-CM

## 2015-10-06 DIAGNOSIS — Z21 Asymptomatic human immunodeficiency virus [HIV] infection status: Secondary | ICD-10-CM

## 2015-10-06 DIAGNOSIS — Z9911 Dependence on respirator [ventilator] status: Secondary | ICD-10-CM

## 2015-10-06 DIAGNOSIS — D649 Anemia, unspecified: Secondary | ICD-10-CM

## 2015-10-06 DIAGNOSIS — J151 Pneumonia due to Pseudomonas: Principal | ICD-10-CM

## 2015-10-06 DIAGNOSIS — Y95 Nosocomial condition: Secondary | ICD-10-CM

## 2015-10-06 DIAGNOSIS — R600 Localized edema: Secondary | ICD-10-CM

## 2015-10-06 DIAGNOSIS — F418 Other specified anxiety disorders: Secondary | ICD-10-CM

## 2015-10-06 DIAGNOSIS — J9610 Chronic respiratory failure, unspecified whether with hypoxia or hypercapnia: Secondary | ICD-10-CM | POA: Insufficient documentation

## 2015-10-06 DIAGNOSIS — R609 Edema, unspecified: Secondary | ICD-10-CM

## 2015-10-06 DIAGNOSIS — G8250 Quadriplegia, unspecified: Secondary | ICD-10-CM

## 2015-10-06 DIAGNOSIS — R4182 Altered mental status, unspecified: Secondary | ICD-10-CM

## 2015-10-06 DIAGNOSIS — Z794 Long term (current) use of insulin: Secondary | ICD-10-CM

## 2015-10-06 DIAGNOSIS — L89153 Pressure ulcer of sacral region, stage 3: Secondary | ICD-10-CM | POA: Diagnosis present

## 2015-10-06 LAB — T-HELPER CELLS (CD4) COUNT (NOT AT ARMC)
CD4 T CELL ABS: 540 /uL (ref 400–2700)
CD4 T CELL HELPER: 41 % (ref 33–55)

## 2015-10-06 LAB — GLUCOSE, CAPILLARY
GLUCOSE-CAPILLARY: 191 mg/dL — AB (ref 65–99)
GLUCOSE-CAPILLARY: 162 mg/dL — AB (ref 65–99)
GLUCOSE-CAPILLARY: 149 mg/dL — AB (ref 65–99)
Glucose-Capillary: 117 mg/dL — ABNORMAL HIGH (ref 65–99)
Glucose-Capillary: 115 mg/dL — ABNORMAL HIGH (ref 65–99)

## 2015-10-06 LAB — BASIC METABOLIC PANEL
Anion gap: 10 (ref 5–15)
BUN: 22 mg/dL — ABNORMAL HIGH (ref 6–20)
CALCIUM: 8.7 mg/dL — AB (ref 8.9–10.3)
CO2: 36 mmol/L — ABNORMAL HIGH (ref 22–32)
CREATININE: 0.65 mg/dL (ref 0.61–1.24)
Chloride: 96 mmol/L — ABNORMAL LOW (ref 101–111)
Glucose, Bld: 176 mg/dL — ABNORMAL HIGH (ref 65–99)
Potassium: 3.7 mmol/L (ref 3.5–5.1)
SODIUM: 142 mmol/L (ref 135–145)

## 2015-10-06 LAB — CBC
HCT: 22.3 % — ABNORMAL LOW (ref 39.0–52.0)
HEMATOCRIT: 24 % — AB (ref 39.0–52.0)
Hemoglobin: 6.7 g/dL — CL (ref 13.0–17.0)
Hemoglobin: 7.4 g/dL — ABNORMAL LOW (ref 13.0–17.0)
MCH: 25.4 pg — ABNORMAL LOW (ref 26.0–34.0)
MCH: 25.8 pg — ABNORMAL LOW (ref 26.0–34.0)
MCHC: 30 g/dL (ref 30.0–36.0)
MCHC: 30.8 g/dL (ref 30.0–36.0)
MCV: 84.5 fL (ref 78.0–100.0)
MCV: 83.6 fL (ref 78.0–100.0)
PLATELETS: 166 10*3/uL (ref 150–400)
Platelets: 151 10*3/uL (ref 150–400)
RBC: 2.64 MIL/uL — AB (ref 4.22–5.81)
RBC: 2.87 MIL/uL — AB (ref 4.22–5.81)
RDW: 17.1 % — AB (ref 11.5–15.5)
RDW: 17 % — ABNORMAL HIGH (ref 11.5–15.5)
WBC: 4.9 10*3/uL (ref 4.0–10.5)
WBC: 7.7 10*3/uL (ref 4.0–10.5)

## 2015-10-06 LAB — BRAIN NATRIURETIC PEPTIDE: B Natriuretic Peptide: 322.8 pg/mL — ABNORMAL HIGH (ref 0.0–100.0)

## 2015-10-06 LAB — PREPARE RBC (CROSSMATCH)

## 2015-10-06 LAB — MRSA PCR SCREENING: MRSA BY PCR: NEGATIVE

## 2015-10-06 LAB — URINE CULTURE: Culture: NO GROWTH

## 2015-10-06 LAB — RPR: RPR Ser Ql: NONREACTIVE

## 2015-10-06 LAB — PHOSPHORUS: PHOSPHORUS: 3.8 mg/dL (ref 2.5–4.6)

## 2015-10-06 MED ORDER — SODIUM CHLORIDE 0.9% FLUSH
10.0000 mL | INTRAVENOUS | Status: DC | PRN
  Administered 2015-10-23: 20 mL
  Administered 2015-10-27 – 2015-11-24 (×4): 10 mL
  Filled 2015-10-06 (×5): qty 40

## 2015-10-06 MED ORDER — CHLORHEXIDINE GLUCONATE 0.12% ORAL RINSE (MEDLINE KIT)
15.0000 mL | Freq: Two times a day (BID) | OROMUCOSAL | Status: DC
  Administered 2015-10-06 – 2015-10-13 (×14): 15 mL via OROMUCOSAL

## 2015-10-06 MED ORDER — BISACODYL 10 MG RE SUPP: 10.0000 mg | Freq: Every day | RECTAL | Status: DC | PRN

## 2015-10-06 MED ORDER — SODIUM CHLORIDE 0.9 % IV SOLN
Freq: Once | INTRAVENOUS | Status: AC
  Administered 2015-10-06: 09:00:00 via INTRAVENOUS

## 2015-10-06 MED ORDER — LABETALOL HCL 5 MG/ML IV SOLN
5.0000 mg | Freq: Once | INTRAVENOUS | Status: DC
  Filled 2015-10-06: qty 4

## 2015-10-06 MED ORDER — ANTISEPTIC ORAL RINSE SOLUTION (CORINZ)
7.0000 mL | Freq: Four times a day (QID) | OROMUCOSAL | Status: DC
  Administered 2015-10-06 – 2015-10-13 (×28): 7 mL via OROMUCOSAL

## 2015-10-06 MED ORDER — GENTAMICIN SULFATE 40 MG/ML IJ SOLN
7.0000 mg/kg | Freq: Once | INTRAVENOUS | Status: AC
  Administered 2015-10-06: 560 mg via INTRAVENOUS
  Filled 2015-10-06: qty 14

## 2015-10-06 MED ORDER — VITAL AF 1.2 CAL PO LIQD
1000.0000 mL | ORAL | Status: DC
  Administered 2015-10-06 – 2015-11-24 (×62): 1000 mL
  Filled 2015-10-06 (×17): qty 1000
  Filled 2015-10-06: qty 237
  Filled 2015-10-06 (×50): qty 1000

## 2015-10-06 MED ORDER — JEVITY 1.2 CAL PO LIQD: 1000.0000 mL | ORAL | Status: DC

## 2015-10-06 MED ORDER — SCOPOLAMINE 1 MG/3DAYS TD PT72
1.0000 | MEDICATED_PATCH | TRANSDERMAL | Status: DC
  Administered 2015-10-06 – 2015-10-18 (×5): 1.5 mg via TRANSDERMAL
  Filled 2015-10-06 (×5): qty 1

## 2015-10-06 MED ORDER — SODIUM CHLORIDE 0.9% FLUSH
10.0000 mL | Freq: Two times a day (BID) | INTRAVENOUS | Status: DC
  Administered 2015-10-06: 10 mL
  Administered 2015-10-07: 20 mL
  Administered 2015-10-07 – 2015-10-08 (×2): 10 mL
  Administered 2015-10-08: 20 mL
  Administered 2015-10-09 – 2015-10-20 (×21): 10 mL
  Administered 2015-10-21 – 2015-10-22 (×2): 20 mL
  Administered 2015-10-22: 10 mL
  Administered 2015-10-23: 30 mL
  Administered 2015-10-24: 20 mL
  Administered 2015-10-24 – 2015-10-29 (×10): 10 mL
  Administered 2015-10-30: 40 mL
  Administered 2015-10-30: 10 mL
  Administered 2015-10-31: 40 mL
  Administered 2015-11-01 – 2015-11-09 (×17): 10 mL
  Administered 2015-11-09 – 2015-11-10 (×2): 20 mL
  Administered 2015-11-10 – 2015-11-17 (×13): 10 mL
  Administered 2015-11-18: 20 mL
  Administered 2015-11-18: 10 mL
  Administered 2015-11-19: 20 mL
  Administered 2015-11-19 – 2015-11-21 (×5): 10 mL
  Administered 2015-11-22 – 2015-11-23 (×2): 20 mL
  Administered 2015-11-23 – 2015-11-25 (×5): 10 mL

## 2015-10-06 NOTE — Progress Notes (Signed)
Pharmacy Antibiotic Note  Anthony Avila is a 70 y.o. male admitted on 10/05/2015 with pneumonia.  Pharmacy has been consulted for Gentamicin dosing. Pt has history of multi-drug resistant Pseudomonas and Stenotrophomonas. ?infection vs colonization. MD requesting addition of Gentamicin for possible new MDR infection while awaiting cultures. Pt has been on extended-interval dosing in the past, will try this first and switch to conventional dosing if needed  Plan: -Gentamicin 560 mg IV x 1 -Check 10 hr post infusion gentamicin level -Then order appropriate dosing interval for extended dosing or switch to conventional dosing  Weight: 175 lb 7.8 oz (79.6 kg)  Temp (24hrs), Avg:97.9 F (36.6 C), Min:97.6 F (36.4 C), Max:98.4 F (36.9 C)   Recent Labs Lab 10/05/15 1616 10/05/15 1639 10/05/15 2152 10/06/15 0615  WBC 7.3  --  5.9 4.9  CREATININE 0.66  --   --  0.65  LATICACIDVEN  --  1.09  --   --     Estimated Creatinine Clearance: 99.5 mL/min (by C-G formula based on Cr of 0.65).    No Known Allergies  [name] 10/06/2015 7:41 AM

## 2015-10-06 NOTE — Consult Note (Addendum)
WOC wound consult note Reason for Consult: Consult requested for left heel, buttocks, and sacrum.  Pt has multiple systemic factors which can impair healing.  Wound type: Left heel with deep tissue pressure injury; .3X1cm, dark black without open wound or drainage Right buttock with chronic unstageable pressure injury; 1.5X1.5X.1cm, 100% yellow slough, small amt yellow drainage, bone palpable, large amt yellow drainage with some odor,  Sacrum with stage 4 pressure injury; 10X8X4cm, with undermining to wound edges to 4 cm, 85% red, 15% yellow interspersed throughout.  Pressure Ulcer POA: Yes Dressing procedure/placement/frequency: Air mattress to reduce pressure.  Pt is wearing Prevalon boots to BLE to float heels.  Foam dressing to left heel and right buttocks to protect from further injury and promote healing.  Moist gauze packing to sacrum to absorb drainage. No family present to discuss plan of care and pt is not responsive. Please re-consult if further assistance is needed.  Thank-you,  Cammie Mcgee MSN, RN, CWOCN, [place], CNS [phone]

## 2015-10-06 NOTE — Progress Notes (Signed)
Internal Medicine Attending  Date: 10/06/2015  Patient name: Anthony Avila Medical record number: [id number] Date of birth: 1945-12-10 Age: 70 y.o. Gender: male  I saw and evaluated the patient. I reviewed the resident's note by Dr. Allena Katz and I agree with the resident's findings and plans as documented in his progress note.  Please see my H&P dated 10/06/2015 and attached to Dr. Gregary Cromer H&P dated 10/05/2015 for specifics of my evaluation, assessment, and plan from earlier today.

## 2015-10-06 NOTE — Progress Notes (Signed)
VASCULAR LAB PRELIMINARY  PRELIMINARY  PRELIMINARY  PRELIMINARY  Bilateral upper extremity venous duplex completed.    Preliminary report:  Bilateral:  No evidence of DVT or superficial thrombosis.  Anthony Avila, RVT 10/06/2015, 7:05 PM

## 2015-10-06 NOTE — Clinical Documentation Improvement (Signed)
Critical Care  Can the diagnosis of HIV be further specified? Conflicting documentation noted in chart: HIV disease  Vs   HIV infection. Your response will drive the DRG.   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  The CDC defines AIDS as present in an HIV positive patient who has or has had any of the following:  - Current or prior CD4+ T-lymphocyte count <200 cells/uL  - Current or prior CD4+ T-lymphocyte count < 14% of total lymphocytes    - Current or prior diagnosis of an AIDS-defining condition     Supporting Information:  Chart notes 2 CD4's resulting as 420 with 54% lymphocytes(06/17/15) and 540 with 41% lymphocytes for current admission  No AIDS related illnesses documented to support HIV Disease  Care Everywhere tab notes HIV infection from [facility]  Please exercise your independent, professional judgment when responding. A specific answer is not anticipated or expected.  Thank You,  Shellee Milo Health Information Management [place] [phone]; Cell: [phone]

## 2015-10-06 NOTE — Care Management Note (Signed)
Case Management Note  Patient Details  Name: Harbor Vanover MRN: [id number] Date of Birth: 1946/05/14  Subjective/Objective:          Adm w ams, has trach and vent, started on iv antibiotics          Action/Plan: from [facility], brother in [place], son in [place]   Expected Discharge Date:                  Expected Discharge Plan:  Skilled Nursing Facility  In-House Referral:  Clinical Social Work  Discharge planning Services     Post Acute Care Choice:    Choice offered to:     DME Arranged:    DME Agency:     HH Arranged:    HH Agency:     Status of Service:     Medicare Important Message Given:    Date Medicare IM Given:    Medicare IM give by:    Date Additional Medicare IM Given:    Additional Medicare Important Message give by:     If discussed at Long Length of Stay Meetings, dates discussed:    Additional Comments: sw ref made. Ur review done  Hanley Hays, RN 10/06/2015, 8:17 AM
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
## 2015-10-06 NOTE — Progress Notes (Signed)
Peripherally Inserted Central Catheter/Midline Placement  The IV Nurse has discussed with the patient and/or persons authorized to consent for the patient, the purpose of this procedure and the potential benefits and risks involved with this procedure.  The benefits include less needle sticks, lab draws from the catheter and patient may be discharged home with the catheter.  Risks include, but not limited to, infection, bleeding, blood clot (thrombus formation), and puncture of an artery; nerve damage and irregular heat beat.  Alternatives to this procedure were also discussed.  PICC/Midline Placement Documentation  PICC Double Lumen 10/06/15 PICC Left Cephalic 49 cm 1 cm (Active)  Indication for Insertion or Continuance of Line Poor Vasculature-patient has had multiple peripheral attempts or PIVs lasting less than 24 hours 10/06/2015  3:00 PM  Exposed Catheter (cm) 1 cm 10/06/2015  3:00 PM  Dressing Change Due 10/13/15 10/06/2015  3:00 PM   Telephone consent   Stacie Glaze Horton 10/06/2015, 3:57 PM

## 2015-10-07 ENCOUNTER — Inpatient Hospital Stay (HOSPITAL_COMMUNITY): Payer: Medicare Other

## 2015-10-07 DIAGNOSIS — G8250 Quadriplegia, unspecified: Secondary | ICD-10-CM | POA: Diagnosis present

## 2015-10-07 DIAGNOSIS — J9611 Chronic respiratory failure with hypoxia: Secondary | ICD-10-CM

## 2015-10-07 DIAGNOSIS — A498 Other bacterial infections of unspecified site: Secondary | ICD-10-CM

## 2015-10-07 DIAGNOSIS — J9610 Chronic respiratory failure, unspecified whether with hypoxia or hypercapnia: Secondary | ICD-10-CM | POA: Diagnosis present

## 2015-10-07 DIAGNOSIS — Z1624 Resistance to multiple antibiotics: Secondary | ICD-10-CM

## 2015-10-07 LAB — CBC
HEMATOCRIT: 20.6 % — AB (ref 39.0–52.0)
HEMOGLOBIN: 6.5 g/dL — AB (ref 13.0–17.0)
MCH: 26.2 pg (ref 26.0–34.0)
MCHC: 31.6 g/dL (ref 30.0–36.0)
MCV: 83.1 fL (ref 78.0–100.0)
Platelets: 136 10*3/uL — ABNORMAL LOW (ref 150–400)
RBC: 2.48 MIL/uL — ABNORMAL LOW (ref 4.22–5.81)
RDW: 17.4 % — ABNORMAL HIGH (ref 11.5–15.5)
WBC: 5.1 10*3/uL (ref 4.0–10.5)

## 2015-10-07 LAB — GLUCOSE, CAPILLARY
GLUCOSE-CAPILLARY: 167 mg/dL — AB (ref 65–99)
Glucose-Capillary: 109 mg/dL — ABNORMAL HIGH (ref 65–99)
Glucose-Capillary: 131 mg/dL — ABNORMAL HIGH (ref 65–99)
Glucose-Capillary: 172 mg/dL — ABNORMAL HIGH (ref 65–99)
Glucose-Capillary: 178 mg/dL — ABNORMAL HIGH (ref 65–99)
Glucose-Capillary: 186 mg/dL — ABNORMAL HIGH (ref 65–99)
Glucose-Capillary: 206 mg/dL — ABNORMAL HIGH (ref 65–99)

## 2015-10-07 LAB — BASIC METABOLIC PANEL
Anion gap: 12 (ref 5–15)
BUN: 15 mg/dL (ref 6–20)
CHLORIDE: 104 mmol/L (ref 101–111)
CO2: 33 mmol/L — AB (ref 22–32)
Calcium: 7.6 mg/dL — ABNORMAL LOW (ref 8.9–10.3)
Creatinine, Ser: 0.57 mg/dL — ABNORMAL LOW (ref 0.61–1.24)
GFR calc Af Amer: >60 mL/min (ref >60)
GFR calc non Af Amer: >60 mL/min (ref >60)
GLUCOSE: 87 mg/dL (ref 65–99)
POTASSIUM: 2.8 mmol/L — AB (ref 3.5–5.1)
Sodium: 149 mmol/L — ABNORMAL HIGH (ref 135–145)

## 2015-10-07 LAB — HIV-1 RNA ULTRAQUANT REFLEX TO GENTYP+
HIV-1 RNA BY PCR: <20 copies/mL
HIV-1 RNA Quant, Log: UNDETERMINED log10copy/mL

## 2015-10-07 LAB — GENTAMICIN LEVEL, RANDOM: Gentamicin Rm: 4.8 ug/mL

## 2015-10-07 LAB — PREPARE RBC (CROSSMATCH)

## 2015-10-07 LAB — PHOSPHORUS: Phosphorus: 1.8 mg/dL — ABNORMAL LOW (ref 2.5–4.6)

## 2015-10-07 LAB — HEMOGLOBIN A1C
Hgb A1c MFr Bld: 9 % — ABNORMAL HIGH (ref 4.8–5.6)
Mean Plasma Glucose: 212 mg/dL

## 2015-10-07 LAB — TECHNOLOGIST SMEAR REVIEW

## 2015-10-07 LAB — LACTATE DEHYDROGENASE: LDH: 153 U/L (ref 98–192)

## 2015-10-07 LAB — HEMOGLOBIN AND HEMATOCRIT, BLOOD
HEMATOCRIT: 27 % — AB (ref 39.0–52.0)
HEMOGLOBIN: 8.5 g/dL — AB (ref 13.0–17.0)

## 2015-10-07 LAB — HEPATITIS C ANTIBODY (REFLEX)

## 2015-10-07 LAB — HCV COMMENT:

## 2015-10-07 MED ORDER — ALPRAZOLAM 0.5 MG PO TABS
0.5000 mg | ORAL_TABLET | Freq: Three times a day (TID) | ORAL | Status: DC | PRN
  Administered 2015-10-07 – 2015-11-25 (×77): 0.5 mg
  Filled 2015-10-07 (×80): qty 1

## 2015-10-07 MED ORDER — OXYCODONE-ACETAMINOPHEN 5-325 MG PO TABS
1.0000 | ORAL_TABLET | Freq: Three times a day (TID) | ORAL | Status: DC | PRN
  Administered 2015-10-07 – 2015-10-08 (×2): 1
  Filled 2015-10-07 (×2): qty 1

## 2015-10-07 MED ORDER — POTASSIUM CHLORIDE 20 MEQ/15ML (10%) PO SOLN
40.0000 meq | Freq: Three times a day (TID) | ORAL | Status: AC
  Administered 2015-10-07 (×3): 40 meq
  Filled 2015-10-07 (×3): qty 30

## 2015-10-07 MED ORDER — PANTOPRAZOLE SODIUM 40 MG PO PACK
40.0000 mg | PACK | Freq: Every day | ORAL | Status: DC
  Administered 2015-10-07 – 2015-11-25 (×50): 40 mg
  Filled 2015-10-07 (×52): qty 20

## 2015-10-07 MED ORDER — GENTAMICIN IN SALINE 1-0.9 MG/ML-% IV SOLN
100.0000 mg | Freq: Two times a day (BID) | INTRAVENOUS | Status: DC
  Administered 2015-10-08 – 2015-10-09 (×4): 100 mg via INTRAVENOUS
  Filled 2015-10-07 (×5): qty 100

## 2015-10-07 MED ORDER — BISACODYL 10 MG RE SUPP
10.0000 mg | Freq: Every day | RECTAL | Status: DC
  Administered 2015-10-25 – 2015-11-19 (×5): 10 mg via RECTAL
  Filled 2015-10-07 (×11): qty 1

## 2015-10-07 MED ORDER — SODIUM CHLORIDE 0.9 % IV SOLN
Freq: Once | INTRAVENOUS | Status: AC
  Administered 2015-10-07: 08:00:00 via INTRAVENOUS

## 2015-10-07 NOTE — Clinical Social Work Note (Signed)
CSW contacted [facility] at [phone] to discuss patient's progress.  CSW left message for Stacy awaiting call back from facility.  Ervin Knack. Kendre Jacinto, MSW, Theresia Majors [phone] 10/07/2015 4:09 PM

## 2015-10-07 NOTE — Progress Notes (Signed)
Pharmacy Antibiotic Note  Addendum 4:42 AM 10 hour random gentamicin level was lost in transit from here to [facility] hospital for testing so a ~16 hour gentamicin level was obtained which was 4.8. Pt falls outside of range for extended interval dosing and will be switched to conventional dosing.   Plan -Will start Gentamicin 100 mg IV q12h at 0800 on 2/17  -F/U timing for getting gent peak/trough =======================================  Anthony Avila is a 70 y.o. male admitted on 10/05/2015 with pneumonia.  Pharmacy has been consulted for Gentamicin dosing. Pt has history of multi-drug resistant Pseudomonas and Stenotrophomonas. ?infection vs colonization. MD requesting addition of Gentamicin for possible new MDR infection while awaiting cultures. Pt has been on extended-interval dosing in the past, will try this first and switch to conventional dosing if needed  Plan: -Gentamicin 560 mg IV x 1 -Check 10 hr post infusion gentamicin level -Then order appropriate dosing interval for extended dosing or switch to conventional dosing  Weight: 171 lb (77.565 kg)  Temp (24hrs), Avg:98.9 F (37.2 C), Min:97.2 F (36.2 C), Max:100.9 F (38.3 C)   Recent Labs Lab 10/05/15 1616 10/05/15 1639 10/05/15 2152 10/06/15 0615 10/06/15 1535 10/07/15 0032  WBC 7.3  --  5.9 4.9 7.7  --   CREATININE 0.66  --   --  0.65  --   --   LATICACIDVEN  --  1.09  --   --   --   --   GENTRANDOM  --   --   --   --   --  4.8    Estimated Creatinine Clearance: 97 mL/min (by C-G formula based on Cr of 0.65).    No Known Allergies  Anthony Avila 10/07/2015 4:42 AM
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
## 2015-10-07 NOTE — Progress Notes (Signed)
Sputum culture collected, sent to lab.  

## 2015-10-08 DIAGNOSIS — L89159 Pressure ulcer of sacral region, unspecified stage: Secondary | ICD-10-CM | POA: Diagnosis present

## 2015-10-08 DIAGNOSIS — M549 Dorsalgia, unspecified: Secondary | ICD-10-CM

## 2015-10-08 LAB — BASIC METABOLIC PANEL
Anion gap: 7 (ref 5–15)
BUN: 16 mg/dL (ref 6–20)
CALCIUM: 8.6 mg/dL — AB (ref 8.9–10.3)
CO2: 34 mmol/L — AB (ref 22–32)
CREATININE: 0.61 mg/dL (ref 0.61–1.24)
Chloride: 100 mmol/L — ABNORMAL LOW (ref 101–111)
GFR calc non Af Amer: >60 mL/min (ref >60)
Glucose, Bld: 203 mg/dL — ABNORMAL HIGH (ref 65–99)
Potassium: 4.4 mmol/L (ref 3.5–5.1)
Sodium: 141 mmol/L (ref 135–145)

## 2015-10-08 LAB — PHOSPHORUS: PHOSPHORUS: 2.2 mg/dL — AB (ref 2.5–4.6)

## 2015-10-08 LAB — CBC
HEMATOCRIT: 26.5 % — AB (ref 39.0–52.0)
Hemoglobin: 8.3 g/dL — ABNORMAL LOW (ref 13.0–17.0)
MCH: 26.3 pg (ref 26.0–34.0)
MCHC: 31.3 g/dL (ref 30.0–36.0)
MCV: 83.9 fL (ref 78.0–100.0)
Platelets: 170 10*3/uL (ref 150–400)
RBC: 3.16 MIL/uL — ABNORMAL LOW (ref 4.22–5.81)
RDW: 17.3 % — AB (ref 11.5–15.5)
WBC: 5.2 10*3/uL (ref 4.0–10.5)

## 2015-10-08 LAB — TYPE AND SCREEN
ABO/RH(D): A POS
ANTIBODY SCREEN: NEGATIVE
UNIT DIVISION: 0
UNIT DIVISION: 0

## 2015-10-08 LAB — GLUCOSE, CAPILLARY
GLUCOSE-CAPILLARY: 184 mg/dL — AB (ref 65–99)
GLUCOSE-CAPILLARY: 217 mg/dL — AB (ref 65–99)
Glucose-Capillary: 188 mg/dL — ABNORMAL HIGH (ref 65–99)
Glucose-Capillary: 210 mg/dL — ABNORMAL HIGH (ref 65–99)
Glucose-Capillary: 168 mg/dL — ABNORMAL HIGH (ref 65–99)

## 2015-10-08 LAB — OCCULT BLOOD X 1 CARD TO LAB, STOOL: Fecal Occult Bld: NEGATIVE

## 2015-10-08 LAB — MAGNESIUM: Magnesium: 2 mg/dL (ref 1.7–2.4)

## 2015-10-08 LAB — HAPTOGLOBIN: HAPTOGLOBIN: 338 mg/dL — AB (ref 34–200)

## 2015-10-08 MED ORDER — DEXTROSE 5 % IV SOLN
20.0000 meq | Freq: Once | INTRAVENOUS | Status: AC
  Administered 2015-10-08: 20 meq via INTRAVENOUS
  Filled 2015-10-08: qty 5

## 2015-10-08 MED ORDER — INSULIN GLARGINE 100 UNIT/ML ~~LOC~~ SOLN
20.0000 [IU] | Freq: Every day | SUBCUTANEOUS | Status: DC
  Administered 2015-10-08 – 2015-10-22 (×15): 20 [IU] via SUBCUTANEOUS
  Filled 2015-10-08 (×16): qty 0.2

## 2015-10-08 MED ORDER — OXYCODONE-ACETAMINOPHEN 5-325 MG PO TABS
1.0000 | ORAL_TABLET | Freq: Four times a day (QID) | ORAL | Status: DC | PRN
  Administered 2015-10-08 – 2015-11-25 (×110): 1
  Filled 2015-10-08 (×112): qty 1

## 2015-10-08 MED ORDER — OXYCODONE HCL 5 MG PO TABS
5.0000 mg | ORAL_TABLET | Freq: Once | ORAL | Status: AC
  Administered 2015-10-08: 5 mg
  Filled 2015-10-08: qty 1

## 2015-10-08 NOTE — Progress Notes (Signed)
Pt has increase cuff pressures in order to get an adequate seal around his trach. Pt talks around his trach but is still getting good volumes inspirations and expirations. Daughter is at bedside and made aware which she was already aware of this regard during the day shift of yesterday.  Pt is stable at this time no complications noted. Pt is resting comfortably
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
## 2015-10-08 NOTE — Clinical Social Work Note (Addendum)
CSW spoke to patient's son Maclain Cohron at [phone], he said they would rather have him come home with home health, and pay privately for in home care.  CSW informed patient's son that the case manager can speak to him regarding setting up home health once patient is medically ready and discharge orders have been received.  CSW explained to patient's son that in order for Medicare SNF benefits to start again, patient will have to be out of the hospital for 60 days, patient's son asked if Medicare would help pay for some of the care in the home, CSW told him that the case manager can talk to him about what is available.  CSW to continue to follow patient's progress in case patient's family decides to go to SNF.  CSW updated case Production designer, theatre/television/film and physician.  Ervin Knack. Traylen Eckels, MSW, Theresia Majors [phone] 10/08/2015 4:25 PM

## 2015-10-08 NOTE — Progress Notes (Signed)
Internal Medicine Attending  Date: 10/08/2015  Patient name: Anthony Avila Medical record number: [id number] Date of birth: 03/11/1946 Age: 70 y.o. Gender: male  I saw and evaluated the patient. I reviewed the resident's note by Dr. Allena Katz and I agree with the resident's findings and plans as documented in his progress note.  He remains stable from a chronic ventilatory standpoint and seems to be clinically responding to the antibiotic therapy. His hemoglobin has also been stable after the second unit of blood he received. His fecal blood test was negative. He also had a negative workup for hemolysis. One bottle (aerobic) was positive for gram-positive cocci and the identification is pending at this time. We appreciate ENT's input that it would be prudent to continue with the current tracheostomy in place given that he has excellent delivery of his tidal volumes. We will need to keep this in mind to make sure that excellent oral hygiene takes place given the concern about aspiration around the cuff. While we await the identification of the blood culture we will be working on placement, which will be tricky given his chronic vent status and the family's desire not to return to [facility]. From our standpoint I suspect we will have a good idea of the necessary antibiotic regimen and duration within the next 48 hours.
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
## 2015-10-08 NOTE — Progress Notes (Signed)
Inpatient Diabetes Program Recommendations  AACE/ADA: New Consensus Statement on Inpatient Glycemic Control (2015)  Target Ranges:  Prepandial:   less than 140 mg/dL      Peak postprandial:   less than 180 mg/dL (1-2 hours)      Critically ill patients:  140 - 180 mg/dL   Review of Glycemic Control  Results for ARYON, NHAM (MRN [id number]) as of 10/08/2015 08:43  Ref. Range 10/07/2015 16:13 10/07/2015 20:11 10/07/2015 23:20 10/08/2015 03:39 10/08/2015 07:49  Glucose-Capillary Latest Ref Range: 65-99 mg/dL 119 (H) 147 (H) 829 (H) 188 (H) 210 (H)    Diabetes history: Type 2, A1C 9% on 10/05/15 Outpatient Diabetes medications: Lantus 23 units qhs, Novolog 0-20 units q6h Current orders for Inpatient glycemic control: Lantus 15 units qhs, Novolog 0-15 units tid  Inpatient Diabetes Program Recommendations:  Please consider increasing Lantus to 20 units qhs, adding Novolog 3 units tube feed coverage q4h (hold if feeds are held or stopped) and change Novolog correction insulin to q4h since he is NPO  Susette Racer, RN, [place], [place], CDE Diabetes Coordinator Inpatient Diabetes Program  [phone] (Team Pager) [phone] [facility] Office) 10/08/2015 8:48 AM

## 2015-10-08 NOTE — Progress Notes (Signed)
Came to assess pt, and check to make sure pt has an patent airway through his trach. Notice pt crying on my arrival and patient stated that he couldn't breath. Pt talks around his trach. Pt began to desat into the low 60's and was in respiratory distress. Breath sounds noted Scattered Rhonchus. Pt was lavaged and suctioning and that seem to help some with his breathing. Got back through inline suctioning COPIOUS Tan/yeollow creamy secretions. Made three passes into his airway for secretion clearance and each time got back Copious amount of tan/yellow secretions. Gave pt postive reinforcement and comfort him best I could that seem to help with his anxiety. Pt is stable now after this event. Primary RN aware and Charge RN aware of event and both at bedside. RT will continue to monitor pt.

## 2015-10-09 DIAGNOSIS — E876 Hypokalemia: Secondary | ICD-10-CM

## 2015-10-09 LAB — CBC
HCT: 27.9 % — ABNORMAL LOW (ref 39.0–52.0)
HEMATOCRIT: 24.4 % — AB (ref 39.0–52.0)
Hemoglobin: 7.3 g/dL — ABNORMAL LOW (ref 13.0–17.0)
Hemoglobin: 8.2 g/dL — ABNORMAL LOW (ref 13.0–17.0)
MCH: 25.3 pg — AB (ref 26.0–34.0)
MCH: 24.9 pg — ABNORMAL LOW (ref 26.0–34.0)
MCHC: 29.9 g/dL — AB (ref 30.0–36.0)
MCHC: 29.4 g/dL — ABNORMAL LOW (ref 30.0–36.0)
MCV: 84.7 fL (ref 78.0–100.0)
MCV: 84.8 fL (ref 78.0–100.0)
PLATELETS: 207 10*3/uL (ref 150–400)
Platelets: 161 10*3/uL (ref 150–400)
RBC: 2.88 MIL/uL — ABNORMAL LOW (ref 4.22–5.81)
RBC: 3.29 MIL/uL — AB (ref 4.22–5.81)
RDW: 17.5 % — AB (ref 11.5–15.5)
RDW: 17.3 % — AB (ref 11.5–15.5)
WBC: 5.3 10*3/uL (ref 4.0–10.5)
WBC: 7 10*3/uL (ref 4.0–10.5)

## 2015-10-09 LAB — GLUCOSE, CAPILLARY
GLUCOSE-CAPILLARY: 244 mg/dL — AB (ref 65–99)
GLUCOSE-CAPILLARY: 203 mg/dL — AB (ref 65–99)
GLUCOSE-CAPILLARY: 173 mg/dL — AB (ref 65–99)
GLUCOSE-CAPILLARY: 138 mg/dL — AB (ref 65–99)
Glucose-Capillary: 138 mg/dL — ABNORMAL HIGH (ref 65–99)

## 2015-10-09 LAB — PHOSPHORUS: PHOSPHORUS: 2.1 mg/dL — AB (ref 2.5–4.6)

## 2015-10-09 LAB — BASIC METABOLIC PANEL
Anion gap: 11 (ref 5–15)
BUN: 17 mg/dL (ref 6–20)
CO2: 30 mmol/L (ref 22–32)
CREATININE: 0.61 mg/dL (ref 0.61–1.24)
Calcium: 8 mg/dL — ABNORMAL LOW (ref 8.9–10.3)
Chloride: 103 mmol/L (ref 101–111)
GFR calc Af Amer: >60 mL/min (ref >60)
GLUCOSE: 187 mg/dL — AB (ref 65–99)
Potassium: 3.4 mmol/L — ABNORMAL LOW (ref 3.5–5.1)
SODIUM: 144 mmol/L (ref 135–145)

## 2015-10-09 LAB — OCCULT BLOOD X 1 CARD TO LAB, STOOL: FECAL OCCULT BLD: NEGATIVE

## 2015-10-09 LAB — GENTAMICIN LEVEL, TROUGH: Gentamicin Trough: 2.1 ug/mL (ref 0.5–2.0)

## 2015-10-09 LAB — GENTAMICIN LEVEL, PEAK: Gentamicin Pk: 6.9 ug/mL (ref 5.0–10.0)

## 2015-10-09 MED ORDER — SENNOSIDES-DOCUSATE SODIUM 8.6-50 MG PO TABS
1.0000 | ORAL_TABLET | Freq: Two times a day (BID) | ORAL | Status: DC
  Administered 2015-10-09 – 2015-11-07 (×36): 1
  Filled 2015-10-09 (×40): qty 1

## 2015-10-09 MED ORDER — POLYETHYLENE GLYCOL 3350 17 G PO PACK
17.0000 g | PACK | Freq: Two times a day (BID) | ORAL | Status: DC
  Administered 2015-10-11 – 2015-11-12 (×26): 17 g
  Filled 2015-10-09 (×32): qty 1

## 2015-10-09 MED ORDER — SODIUM PHOSPHATE 3 MMOLE/ML IV SOLN
40.0000 meq | Freq: Once | INTRAVENOUS | Status: AC
  Administered 2015-10-09: 40 meq via INTRAVENOUS
  Filled 2015-10-09: qty 10

## 2015-10-09 MED ORDER — POTASSIUM CHLORIDE 20 MEQ/15ML (10%) PO SOLN
40.0000 meq | Freq: Once | ORAL | Status: AC
  Administered 2015-10-09: 40 meq via ORAL
  Filled 2015-10-09: qty 30

## 2015-10-09 MED ORDER — GENTAMICIN SULFATE 40 MG/ML IJ SOLN
120.0000 mg | INTRAVENOUS | Status: DC
  Administered 2015-10-10 – 2015-10-18 (×11): 120 mg via INTRAVENOUS
  Filled 2015-10-09 (×14): qty 3

## 2015-10-09 NOTE — Progress Notes (Signed)
Pharmacy Antibiotic Note   Anthony Avila is a 70 y.o. male admitted on 10/05/2015 with pneumonia.  Pharmacy has been consulted for Gentamicin dosing. Pt has history of multi-drug resistant Pseudomonas and Stenotrophomonas.   Gentamicin trough tonight above goal.  Plan: Change Gentamicin 120 mg IV q18h   Height:  (188 cm) Weight: 186 lb (84.369 kg) IBW/kg (Calculated) : 82.2  Temp (24hrs), Avg:99 F (37.2 C), Min:98.6 F (37 C), Max:99.5 F (37.5 C)   Recent Labs Lab 10/05/15 1616 10/05/15 1639  10/06/15 0615 10/06/15 1535 10/07/15 0032 10/07/15 0651 10/08/15 0445 10/09/15 0547 10/09/15 1615 10/09/15 1935 10/09/15 2050  WBC 7.3  --   < > 4.9 7.7  --  5.1 5.2 5.3 7.0  --   --   CREATININE 0.66  --   --  0.65  --   --  0.57* 0.61 0.61  --   --   --   LATICACIDVEN  --  1.09  --   --   --   --   --   --   --   --   --   --   GENTTROUGH  --   --   --   --   --   --   --   --   --   --  2.1*  --   GENTPEAK  --   --   --   --   --   --   --   --   --   --   --  6.9  GENTRANDOM  --   --   --   --   --  4.8  --   --   --   --   --   --   < > = values in this interval not displayed.  Estimated Creatinine Clearance: 102.8 mL/min (by C-G formula based on Cr of 0.61).    No Known Allergies  Eddie Candle 10/09/2015 11:06 PM

## 2015-10-09 NOTE — Progress Notes (Signed)
Patient having excessively thick secretions that are obstructing airway and causing patient to have multiple episodes of oxygen desaturation. RT in room to help clear airway. Patient O2 sats have increased at this time and will continue to monitor patient. Milon Dikes, RN

## 2015-10-09 NOTE — Progress Notes (Signed)
Patient hemoglobin 7.3. Paged Internal Medicine MD and notified Dr. Loney Loh of value. MD said no transfusion at this time. Will continue to monitor. Milon Dikes, RN

## 2015-10-09 NOTE — Progress Notes (Signed)
Internal Medicine Attending  Date: 10/09/2015  Patient name: Anthony Avila Medical record number: [id number] Date of birth: November 09, 1945 Age: 70 y.o. Gender: male  I saw and evaluated the patient. I reviewed the resident's note by Dr. Allena Katz and I agree with the resident's findings and plans as documented in his progress note.  Anthony Avila had an event overnight of desaturating into the 60s associated with copious secretions within the lungs. This highlights our concern for the ability to talk around the trach as he likely continues to aspirate. We will continue aggressive pulmonary toilet and treatment of his healthcare associated multidrug resistant Pseudomonas pneumonia. When I saw him on rounds this morning he was comfortable and denied any pain having recently received his pain medication. He was breathing comfortably and only minimal secretions were aspirated through his in-line suction catheter. We will continue the current antibiotics for a total 14 days. The family would like to take him home to receive care and social work is beginning to address this issue.
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
## 2015-10-10 LAB — CBC
HCT: 27.6 % — ABNORMAL LOW (ref 39.0–52.0)
Hemoglobin: 8.1 g/dL — ABNORMAL LOW (ref 13.0–17.0)
MCH: 24.8 pg — ABNORMAL LOW (ref 26.0–34.0)
MCHC: 29.3 g/dL — AB (ref 30.0–36.0)
MCV: 84.7 fL (ref 78.0–100.0)
Platelets: 196 10*3/uL (ref 150–400)
RBC: 3.26 MIL/uL — ABNORMAL LOW (ref 4.22–5.81)
RDW: 17.2 % — AB (ref 11.5–15.5)
WBC: 5.7 10*3/uL (ref 4.0–10.5)

## 2015-10-10 LAB — BASIC METABOLIC PANEL
ANION GAP: 7 (ref 5–15)
BUN: 19 mg/dL (ref 6–20)
CALCIUM: 8.6 mg/dL — AB (ref 8.9–10.3)
CO2: 32 mmol/L (ref 22–32)
CREATININE: 0.59 mg/dL — AB (ref 0.61–1.24)
Chloride: 99 mmol/L — ABNORMAL LOW (ref 101–111)
Glucose, Bld: 208 mg/dL — ABNORMAL HIGH (ref 65–99)
Potassium: 3.9 mmol/L (ref 3.5–5.1)
SODIUM: 138 mmol/L (ref 135–145)

## 2015-10-10 LAB — GLUCOSE, CAPILLARY
GLUCOSE-CAPILLARY: 228 mg/dL — AB (ref 65–99)
GLUCOSE-CAPILLARY: 156 mg/dL — AB (ref 65–99)
GLUCOSE-CAPILLARY: 184 mg/dL — AB (ref 65–99)
Glucose-Capillary: 146 mg/dL — ABNORMAL HIGH (ref 65–99)
Glucose-Capillary: 195 mg/dL — ABNORMAL HIGH (ref 65–99)
Glucose-Capillary: 198 mg/dL — ABNORMAL HIGH (ref 65–99)
Glucose-Capillary: 206 mg/dL — ABNORMAL HIGH (ref 65–99)

## 2015-10-10 LAB — CULTURE, BLOOD (ROUTINE X 2): Culture: NO GROWTH

## 2015-10-10 LAB — PHOSPHORUS: PHOSPHORUS: 2.9 mg/dL (ref 2.5–4.6)

## 2015-10-10 MED ORDER — INSULIN ASPART 100 UNIT/ML ~~LOC~~ SOLN
0.0000 [IU] | SUBCUTANEOUS | Status: DC
  Administered 2015-10-10 (×3): 3 [IU] via SUBCUTANEOUS
  Administered 2015-10-10 – 2015-10-11 (×3): 2 [IU] via SUBCUTANEOUS
  Administered 2015-10-11 (×2): 3 [IU] via SUBCUTANEOUS
  Administered 2015-10-11: 2 [IU] via SUBCUTANEOUS
  Administered 2015-10-12: 3 [IU] via SUBCUTANEOUS
  Administered 2015-10-12: 2 [IU] via SUBCUTANEOUS
  Administered 2015-10-12 (×3): 3 [IU] via SUBCUTANEOUS
  Administered 2015-10-12: 2 [IU] via SUBCUTANEOUS
  Administered 2015-10-13: 3 [IU] via SUBCUTANEOUS
  Administered 2015-10-13 (×3): 2 [IU] via SUBCUTANEOUS
  Administered 2015-10-14: 3 [IU] via SUBCUTANEOUS
  Administered 2015-10-14: 2 [IU] via SUBCUTANEOUS
  Administered 2015-10-14: 3 [IU] via SUBCUTANEOUS
  Administered 2015-10-14: 2 [IU] via SUBCUTANEOUS
  Administered 2015-10-14: 3 [IU] via SUBCUTANEOUS
  Administered 2015-10-14: 2 [IU] via SUBCUTANEOUS
  Administered 2015-10-15 (×2): 3 [IU] via SUBCUTANEOUS
  Administered 2015-10-15 (×2): 2 [IU] via SUBCUTANEOUS
  Administered 2015-10-15: 3 [IU] via SUBCUTANEOUS
  Administered 2015-10-15: 2 [IU] via SUBCUTANEOUS
  Administered 2015-10-16 (×2): 3 [IU] via SUBCUTANEOUS
  Administered 2015-10-16: 2 [IU] via SUBCUTANEOUS
  Administered 2015-10-17: 3 [IU] via SUBCUTANEOUS
  Administered 2015-10-17: 2 [IU] via SUBCUTANEOUS
  Administered 2015-10-17 (×2): 3 [IU] via SUBCUTANEOUS
  Administered 2015-10-18: 2 [IU] via SUBCUTANEOUS
  Administered 2015-10-18 (×3): 3 [IU] via SUBCUTANEOUS
  Administered 2015-10-18 – 2015-10-19 (×4): 2 [IU] via SUBCUTANEOUS
  Administered 2015-10-19 (×2): 3 [IU] via SUBCUTANEOUS
  Administered 2015-10-19: 2 [IU] via SUBCUTANEOUS
  Administered 2015-10-20: 3 [IU] via SUBCUTANEOUS
  Administered 2015-10-20 (×2): 2 [IU] via SUBCUTANEOUS
  Administered 2015-10-20: 0 [IU] via SUBCUTANEOUS
  Administered 2015-10-20 – 2015-10-21 (×5): 2 [IU] via SUBCUTANEOUS
  Administered 2015-10-21: 3 [IU] via SUBCUTANEOUS
  Administered 2015-10-22: 2 [IU] via SUBCUTANEOUS
  Administered 2015-10-22 (×2): 3 [IU] via SUBCUTANEOUS
  Administered 2015-10-23: 2 [IU] via SUBCUTANEOUS
  Administered 2015-10-23: 3 [IU] via SUBCUTANEOUS
  Administered 2015-10-23 (×2): 2 [IU] via SUBCUTANEOUS
  Administered 2015-10-24: 3 [IU] via SUBCUTANEOUS
  Administered 2015-10-24 (×3): 2 [IU] via SUBCUTANEOUS
  Administered 2015-10-25: 3 [IU] via SUBCUTANEOUS
  Administered 2015-10-25 (×2): 2 [IU] via SUBCUTANEOUS
  Administered 2015-10-25: 3 [IU] via SUBCUTANEOUS
  Administered 2015-10-26 – 2015-10-27 (×7): 2 [IU] via SUBCUTANEOUS
  Administered 2015-10-28 (×2): 3 [IU] via SUBCUTANEOUS
  Administered 2015-10-28: 2 [IU] via SUBCUTANEOUS
  Administered 2015-10-28: 3 [IU] via SUBCUTANEOUS
  Administered 2015-10-28 – 2015-10-29 (×3): 2 [IU] via SUBCUTANEOUS
  Administered 2015-10-29: 3 [IU] via SUBCUTANEOUS
  Administered 2015-10-29: 5 [IU] via SUBCUTANEOUS
  Administered 2015-10-30 – 2015-10-31 (×8): 2 [IU] via SUBCUTANEOUS
  Administered 2015-10-31: 3 [IU] via SUBCUTANEOUS
  Administered 2015-10-31 – 2015-11-08 (×18): 2 [IU] via SUBCUTANEOUS
  Administered 2015-11-08: 1 [IU] via SUBCUTANEOUS
  Administered 2015-11-08 – 2015-11-11 (×12): 2 [IU] via SUBCUTANEOUS
  Administered 2015-11-12: 3 [IU] via SUBCUTANEOUS
  Administered 2015-11-12 – 2015-11-15 (×7): 2 [IU] via SUBCUTANEOUS
  Administered 2015-11-16: 0 [IU] via SUBCUTANEOUS
  Administered 2015-11-16 (×2): 2 [IU] via SUBCUTANEOUS
  Administered 2015-11-17 (×2): 0 [IU] via SUBCUTANEOUS
  Administered 2015-11-17 – 2015-11-18 (×4): 2 [IU] via SUBCUTANEOUS
  Administered 2015-11-18: 3 [IU] via SUBCUTANEOUS
  Administered 2015-11-18: 2 [IU] via SUBCUTANEOUS
  Administered 2015-11-19: 3 [IU] via SUBCUTANEOUS
  Administered 2015-11-19 – 2015-11-20 (×4): 2 [IU] via SUBCUTANEOUS
  Administered 2015-11-20: 3 [IU] via SUBCUTANEOUS
  Administered 2015-11-21 – 2015-11-25 (×7): 2 [IU] via SUBCUTANEOUS

## 2015-10-10 NOTE — Progress Notes (Signed)
10/09/15 2135 Gent level Trough of 2.1 called to Pharmacist. Jerry Caras \

## 2015-10-10 NOTE — Progress Notes (Signed)
Internal Medicine Attending  Date: 10/10/2015  Patient name: Anthony Avila Medical record number: [id number] Date of birth: 1945/10/12 Age: 70 y.o. Gender: male  I saw and evaluated the patient. I reviewed the resident's note by Dr. Allena Katz and I agree with the resident's findings and plans as documented in his progress note.  Anthony Avila sputum is growing Pseudomonas not unsurprisingly. We are waiting on sensitivities. We are also continuing to sort out ultimate disposition. In the meantime, we continue to treat his pseudomonal pneumonia that is healthcare associated with 2 antipseudomonal antibiotics for a planned 14 day course.
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
## 2015-10-11 ENCOUNTER — Inpatient Hospital Stay (HOSPITAL_COMMUNITY)
Admission: EM | Disposition: A | Discharge status: Skilled Nursing Facility | Payer: Medicare Other | Source: Home / Self Care | Attending: Internal Medicine

## 2015-10-11 LAB — GLUCOSE, CAPILLARY
GLUCOSE-CAPILLARY: 153 mg/dL — AB (ref 65–99)
GLUCOSE-CAPILLARY: 165 mg/dL — AB (ref 65–99)
GLUCOSE-CAPILLARY: 131 mg/dL — AB (ref 65–99)
GLUCOSE-CAPILLARY: 161 mg/dL — AB (ref 65–99)
Glucose-Capillary: 106 mg/dL — ABNORMAL HIGH (ref 65–99)

## 2015-10-11 SURGERY — CREATION, TRACHEOSTOMY
Anesthesia: General
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
## 2015-10-11 NOTE — Progress Notes (Signed)
Have sent inform on private duty nsg agencies in [place], have prices for amb transport. Await call back from son to see which dme he prefers . Spoke w cm at local hosp and found phy practice that does home visits. Sent all above inform to son. Will cont to follow.

## 2015-10-11 NOTE — Progress Notes (Signed)
Internal Medicine Attending  Date: 10/11/2015  Patient name: Anthony Avila Medical record number: [id number] Date of birth: [birth year] Age: 69 y.o. Gender: male  I saw and evaluated the patient. I reviewed the resident's note by Dr. Allena Katz and I agree with the resident's findings and plans as documented in his progress note.  [name] is unchanged today other than the fact that he continues to have significant secretions requiring frequent suctioning by nursing. This is likely put quite a strain on the family when they take him home. Along those lines case management continues to work diligently on arranging appropriate support for the family's goal of taking [name] home. In the meantime, we will continue with supportive care and antibiotics pending further sensitivities of the multidrug resistant Pseudomonas.

## 2015-10-12 DIAGNOSIS — A498 Other bacterial infections of unspecified site: Secondary | ICD-10-CM

## 2015-10-12 LAB — GLUCOSE, CAPILLARY
GLUCOSE-CAPILLARY: 137 mg/dL — AB (ref 65–99)
GLUCOSE-CAPILLARY: 175 mg/dL — AB (ref 65–99)
GLUCOSE-CAPILLARY: 170 mg/dL — AB (ref 65–99)
Glucose-Capillary: 163 mg/dL — ABNORMAL HIGH (ref 65–99)
Glucose-Capillary: 133 mg/dL — ABNORMAL HIGH (ref 65–99)
Glucose-Capillary: 173 mg/dL — ABNORMAL HIGH (ref 65–99)

## 2015-10-12 LAB — BASIC METABOLIC PANEL
ANION GAP: 5 (ref 5–15)
BUN: 18 mg/dL (ref 6–20)
CALCIUM: 9 mg/dL (ref 8.9–10.3)
CO2: 34 mmol/L — ABNORMAL HIGH (ref 22–32)
CREATININE: 0.56 mg/dL — AB (ref 0.61–1.24)
Chloride: 104 mmol/L (ref 101–111)
GFR calc Af Amer: >60 mL/min (ref >60)
GLUCOSE: 143 mg/dL — AB (ref 65–99)
Potassium: 3.8 mmol/L (ref 3.5–5.1)
Sodium: 143 mmol/L (ref 135–145)

## 2015-10-12 LAB — CULTURE, BLOOD (ROUTINE X 2)

## 2015-10-12 MED ORDER — CHLORHEXIDINE GLUCONATE 0.12 % MT SOLN
OROMUCOSAL | Status: AC
  Administered 2015-10-12: 15 mL
  Filled 2015-10-12: qty 15

## 2015-10-12 NOTE — Progress Notes (Addendum)
Spoke w son pierre Wierzbicki this am. He is going to start pricing private duty nsg today. Went over prices for [employer]. He is going to decide which fam member to be contact in [place] and let me know name and phone number for home dme visit. Will work on stating home vent  w dme today.sw to cont to follow in case home program does not work out and vent snf still needed. Spoke w both private duty agencies and both are familiar w vents from [facility]. Ref to jermaine at [facility] to start working on home vent,hospbed,tube feeds, suct.  Will awaits son decision on which fam member will help be point person in [place]. 1415 hrs have started placing dme orders so [facility] can start working on home eq. Await decision of son on fam member who will be point in [place] and name of private duty agency.

## 2015-10-12 NOTE — Clinical Social Work Note (Signed)
CSW continuing to follow patient's progress in case the family decides to have patient return to [facility].  CSW updated [facility] that patient may be returning home with home health, but will still consider going back to SNF if they have to.  Ervin Knack. Shawnique Mariotti, MSW, Theresia Majors [phone] 10/12/2015 1:44 PM
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
## 2015-10-12 NOTE — Progress Notes (Signed)
Internal Medicine Attending  Date: 10/12/2015  Patient name: Anthony Avila Medical record number: [id number] Date of birth: Dec 01, 1945 Age: 70 y.o. Gender: male  I saw and evaluated the patient. I reviewed the resident's note by Dr. Senaida Ores and I agree with the resident's findings and plans as documented in her progress note.  We have received the sensitivities for the Pseudomonas. Unfortunately it is resistant to the meropenem but is sensitive to multiple aminoglycosides and intermediately sensitive to levofloxacin. It is also sensitive to norfloxacin, although this is not an antibiotic I would use for pneumonia. Although we would prefer double coverage for this severe pseudomonal pneumonia, such coverage is not available for [name] with this particular bacteria. Fortunately, he has responded to the IV gentamicin and we will complete a 14 day course with this antibiotic. We are hopeful that we are achieving bacteriocidal concentrations within the lung for this usually bacteriostatic agent. We continue to work on placement per the family wishes.
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
## 2015-10-12 NOTE — Progress Notes (Signed)
Pharmacy Antibiotic Note   Anthony Avila is a 70 y.o. male admitted on 10/05/2015 with pneumonia.  Pharmacy has been consulted for Gentamicin dosing. Pt has history of multi-drug resistant Pseudomonas and Stenotrophomonas.    Meropenem/Gentamicin D#7 or pseudomonas PNA/UTI - Afebrile, WBC WNL, Scr 0.56 (Quad, this appears to be his baseline), LA 1.90 - PSA sensitive to meropenem on 2/2 per MD but unclear if this was treated PSA MDR now, still with heavy amts of secretions *asked lab to test avytaz and zerbaxa  Zosyn x 1 2/14 Vanc x 1 2/14  Meropenem 2/15>> Gentamicin 2/15>> 2/16 gent random = 4.8 after  x 1 dose 2/18 gent trough = 2.1, gent peak 6.9 on gent 100 q12h Changed to [facility] 120 IV q18h for peak ~ 8, trough ~ 1  2/16 resp >> pseudomonas 2/14 urine>> negative 2/14 blood x2>> 1/2 gram + cocci in chains/pairs>>reincubate 11/1 urine >> negative 10/26 resp >> pseudomonas, stenotrophomonas (pseudo S - cipro, gent, tob, steno S - levaquin, bactrim) 10/25 resp >> pseudomonas (S-cipro, gent) 10/22 blood x2 >> negative  10/11 resp >> pseudomonas (S-cipro, gent, imi) 10/2 urine >> pseudomonas (S-cipro, gent, imi) 10/2 blood >> 1/2 CoNS  Plan: Continue Gentamicin 120 mg IV q18h Continue Merrem 1g q8 for now Follow up additional sensitivities   Height:  (188 cm) Weight: 171 lb (77.565 kg) IBW/kg (Calculated) : 82.2  Temp (24hrs), Avg:97.7 F (36.5 C), Min:96.4 F (35.8 C), Max:99.5 F (37.5 C)   Recent Labs Lab 10/05/15 1639  10/07/15 0032 10/07/15 0651 10/08/15 0445 10/09/15 0547 10/09/15 1615 10/09/15 1935 10/09/15 2050 10/10/15 0400 10/12/15 0425  WBC  --   < >  --  5.1 5.2 5.3 7.0  --   --  5.7  --   CREATININE  --   < >  --  0.57* 0.61 0.61  --   --   --  0.59* 0.56*  LATICACIDVEN 1.09  --   --   --   --   --   --   --   --   --   --   GENTTROUGH  --   --   --   --   --   --   --  2.1*  --   --   --   GENTPEAK  --   --   --   --   --   --   --   --   6.9  --   --   GENTRANDOM  --   --  4.8  --   --   --   --   --   --   --   --   < > = values in this interval not displayed.  Estimated Creatinine Clearance: 97 mL/min (by C-G formula based on Cr of 0.56).    No Known Allergies  Severiano Gilbert 10/12/2015 8:05 AM
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
## 2015-10-13 LAB — BASIC METABOLIC PANEL
Anion gap: 11 (ref 5–15)
BUN: 19 mg/dL (ref 6–20)
CALCIUM: 8.9 mg/dL (ref 8.9–10.3)
CO2: 30 mmol/L (ref 22–32)
CREATININE: 0.66 mg/dL (ref 0.61–1.24)
Chloride: 100 mmol/L — ABNORMAL LOW (ref 101–111)
GFR calc Af Amer: >60 mL/min (ref >60)
GLUCOSE: 149 mg/dL — AB (ref 65–99)
Potassium: 3.9 mmol/L (ref 3.5–5.1)
Sodium: 141 mmol/L (ref 135–145)

## 2015-10-13 LAB — GLUCOSE, CAPILLARY
GLUCOSE-CAPILLARY: 141 mg/dL — AB (ref 65–99)
GLUCOSE-CAPILLARY: 142 mg/dL — AB (ref 65–99)
GLUCOSE-CAPILLARY: 149 mg/dL — AB (ref 65–99)
GLUCOSE-CAPILLARY: 192 mg/dL — AB (ref 65–99)
GLUCOSE-CAPILLARY: 78 mg/dL (ref 65–99)
Glucose-Capillary: 169 mg/dL — ABNORMAL HIGH (ref 65–99)

## 2015-10-13 MED ORDER — CHLORHEXIDINE GLUCONATE 0.12% ORAL RINSE (MEDLINE KIT)
15.0000 mL | Freq: Two times a day (BID) | OROMUCOSAL | Status: DC
  Administered 2015-10-13 – 2015-11-25 (×76): 15 mL via OROMUCOSAL

## 2015-10-13 MED ORDER — ANTISEPTIC ORAL RINSE SOLUTION (CORINZ)
7.0000 mL | Freq: Four times a day (QID) | OROMUCOSAL | Status: DC
  Administered 2015-10-13 – 2015-11-25 (×159): 7 mL via OROMUCOSAL
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
## 2015-10-13 NOTE — Progress Notes (Signed)
Internal Medicine Attending  Date: 10/13/2015  Patient name: Anthony Avila Medical record number: [id number] Date of birth: 1946-05-04 Age: 70 y.o. Gender: male  I saw and evaluated the patient. I reviewed the resident's note by Dr. Allena Katz and I agree with the resident's findings and plans as documented in his progress note.  He remains clinically stable on current therapy for his presumed Pseudomonas pneumonia.  Case Management continues to work hard on trying to set up transfer to family's home and we understand this is a very long process.

## 2015-10-14 DIAGNOSIS — R401 Stupor: Secondary | ICD-10-CM

## 2015-10-14 DIAGNOSIS — R4182 Altered mental status, unspecified: Secondary | ICD-10-CM | POA: Insufficient documentation

## 2015-10-14 LAB — GLUCOSE, CAPILLARY
GLUCOSE-CAPILLARY: 128 mg/dL — AB (ref 65–99)
GLUCOSE-CAPILLARY: 135 mg/dL — AB (ref 65–99)
Glucose-Capillary: 147 mg/dL — ABNORMAL HIGH (ref 65–99)
Glucose-Capillary: 153 mg/dL — ABNORMAL HIGH (ref 65–99)
Glucose-Capillary: 157 mg/dL — ABNORMAL HIGH (ref 65–99)
Glucose-Capillary: 176 mg/dL — ABNORMAL HIGH (ref 65–99)

## 2015-10-14 LAB — GENTAMICIN LEVEL, TROUGH: GENTAMICIN TR: 1.9 ug/mL (ref 0.5–2.0)

## 2015-10-14 NOTE — Progress Notes (Signed)
RN informed CM that she was informed that son would not be @ hospital today as expected, was preparing pt's home for equipment and would not be able to travel to [place].  CM called niece, Wandra Mannan [phone]), and discussed need for family member to train on vent management.  She agreed to meet [facility] RT in the AM for training - [facility] liaison notified.
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
## 2015-10-14 NOTE — Progress Notes (Signed)
Pharmacy Antibiotic Note   Anthony Avila is a 70 y.o. male admitted on 10/05/2015 with pneumonia.  Pharmacy has been consulted for Gentamicin dosing. Pt has history of multi-drug resistant Pseudomonas and Stenotrophomonas.    Meropenem/Gentamicin D#7 or pseudomonas PNA/UTI - Afebrile, WBC WNL, Scr 0.56 (Quad, this appears to be his baseline), LA 1.90 - PSA sensitive to meropenem on 2/2 per MD but unclear if this was treated PSA MDR now, still with heavy amts of secretions *asked lab to test avytaz and zerbaxa  Zosyn x 1 2/14 Vanc x 1 2/14  Meropenem 2/15>> Gentamicin 2/15>> 2/16 gent random = 4.8 after  x 1 dose 2/18 gent trough = 2.1, gent peak 6.9 on gent 100 q12h Changed to [facility] 120 IV q18h for peak ~ 8, trough < 2 2/23 gent trough = 1.9 on gent  q18h  2/16 resp >> pseudomonas 2/14 urine>> negative 2/14 blood x2>> 1/2 gram + cocci in chains/pairs>>reincubate 11/1 urine >> negative 10/26 resp >> pseudomonas, stenotrophomonas (pseudo S - cipro, gent, tob, steno S - levaquin, bactrim) 10/25 resp >> pseudomonas (S-cipro, gent) 10/22 blood x2 >> negative  10/11 resp >> pseudomonas (S-cipro, gent, imi) 10/2 urine >> pseudomonas (S-cipro, gent, imi) 10/2 blood >> 1/2 CoNS  Plan: Continue Gentamicin 120 mg IV q18h Continue Merrem 1g q8 for now Follow up additional sensitivities   Height:  (188 cm) Weight: 164 lb (74.39 kg) IBW/kg (Calculated) : 82.2  Temp (24hrs), Avg:97.7 F (36.5 C), Min:97.1 F (36.2 C), Max:98.4 F (36.9 C)   Recent Labs Lab 10/07/15 0651 10/08/15 0445 10/09/15 0547 10/09/15 1615 10/09/15 1935 10/09/15 2050 10/10/15 0400 10/12/15 0425 10/13/15 0532 10/14/15 0400  WBC 5.1 5.2 5.3 7.0  --   --  5.7  --   --   --   CREATININE 0.57* 0.61 0.61  --   --   --  0.59* 0.56* 0.66  --   GENTTROUGH  --   --   --   --  2.1*  --   --   --   --  1.9  GENTPEAK  --   --   --   --   --  6.9  --   --   --   --     Estimated Creatinine Clearance:  93 mL/min (by C-G formula based on Cr of 0.66).    No Known Allergies  Arlean Hopping. Newman Pies, PharmD, BCPS Clinical Pharmacist Pager [phone] 10/14/2015 5:35 AM
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
## 2015-10-14 NOTE — Trach Care Team (Signed)
Trach Care Progression Note   Patient Details Name: Anthony Avila MRN: [id number] DOB: [birth year] Today's Date: 10/14/2015   Tracheostomy Assessment    Tracheostomy 7 mm Cuffed (Active)  Status Secured 10/14/2015  1:39 PM  Site Assessment Clean;Dry 10/14/2015  1:39 PM  Site Care Protective barrier to skin 10/14/2015  9:04 AM  Inner Cannula Care No inner cannula 10/13/2015  9:03 AM  Ties Assessment Clean;Dry 10/14/2015  1:39 PM  Cuff pressure (cm) 50 cm 10/13/2015  4:04 PM  Emergency Equipment at bedside Yes 10/14/2015  1:39 PM     Care Needs     Respiratory Therapy O2 Device: Ventilator FiO2 (%): 30 % SpO2: 100 %    Speech Language Pathology      Physical Therapy      Occupational Therapy      Nutritional Patient's Current Diet: Tube feeding Tube Feeding: Vital AF 1.2 Cal Tube Feeding Frequency: Continuous Tube Feeding Strength: Full strength    Case Management/Social Work      Theatre manager Care Team/Provider Recommendations Trach Care Team Members Present-  Lorinda Creed and Graciella Belton, RD    Continues on full support. Discharge plan pending. Possible discharge home. Family will need education once discharge plan determined.          Rashi Giuliani, Silva Bandy 10/14/2015, 3:17 PM Scribe for Team

## 2015-10-14 NOTE — Progress Notes (Signed)
Internal Medicine Attending  Date: 10/14/2015  Patient name: Anthony Avila Medical record number: [id number] Date of birth: 07/30/1946 Age: 70 y.o. Gender: male  I saw and evaluated the patient. I reviewed the resident's note by Dr. Allena Katz and I agree with the resident's findings and plans as documented in his progress note.  [name] was unchanged when seen on rounds today. He had some minor back pain but had just received his Percocet. Nursing reports that he continues to have significant oral secretions but that the pulmonary secretions are minimal. We are continuing our current therapy and waiting set up of all the necessary infrastructure so that he can be discharged to home per patient and family wishes if this is possible.

## 2015-10-15 LAB — BASIC METABOLIC PANEL
Anion gap: 10 (ref 5–15)
BUN: 25 mg/dL — AB (ref 6–20)
CALCIUM: 7.5 mg/dL — AB (ref 8.9–10.3)
CO2: 35 mmol/L — ABNORMAL HIGH (ref 22–32)
CREATININE: 0.91 mg/dL (ref 0.61–1.24)
Chloride: 96 mmol/L — ABNORMAL LOW (ref 101–111)
GFR calc Af Amer: >60 mL/min (ref >60)
GLUCOSE: 126 mg/dL — AB (ref 65–99)
Potassium: 3.3 mmol/L — ABNORMAL LOW (ref 3.5–5.1)
SODIUM: 141 mmol/L (ref 135–145)

## 2015-10-15 LAB — GLUCOSE, CAPILLARY
GLUCOSE-CAPILLARY: 165 mg/dL — AB (ref 65–99)
GLUCOSE-CAPILLARY: 117 mg/dL — AB (ref 65–99)
GLUCOSE-CAPILLARY: 139 mg/dL — AB (ref 65–99)
GLUCOSE-CAPILLARY: 140 mg/dL — AB (ref 65–99)
Glucose-Capillary: 163 mg/dL — ABNORMAL HIGH (ref 65–99)
Glucose-Capillary: 156 mg/dL — ABNORMAL HIGH (ref 65–99)
Glucose-Capillary: 134 mg/dL — ABNORMAL HIGH (ref 65–99)

## 2015-10-15 MED ORDER — SODIUM CHLORIDE 0.9 % IV SOLN
INTRAVENOUS | Status: DC | PRN
  Administered 2015-10-15: 07:00:00 via INTRAVENOUS
  Administered 2015-10-18 – 2015-10-29 (×3): 250 mL via INTRAVENOUS
  Administered 2015-11-02 – 2015-11-06 (×2): 1000 mL via INTRAVENOUS
  Administered 2015-11-08 – 2015-11-10 (×2): 500 mL via INTRAVENOUS

## 2015-10-15 MED ORDER — POTASSIUM CHLORIDE 20 MEQ/15ML (10%) PO SOLN
40.0000 meq | Freq: Once | ORAL | Status: AC
Start: 2015-10-15 — End: 2015-10-15
  Administered 2015-10-15: 40 meq
  Filled 2015-10-15: qty 30

## 2015-10-15 MED ORDER — FREE WATER
200.0000 mL | Freq: Three times a day (TID) | Status: DC
  Administered 2015-10-15 – 2015-11-25 (×118): 200 mL
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
## 2015-10-15 NOTE — Progress Notes (Signed)
CM talked with niece, Wandra Mannan, who states pt had weaned from vent @ one time and was on trach collar until he developed a mucus plug and became very anxious which necessitated him being placed back on vent.  She proposes weaning trials.  Also discussed lack of primary caregiver in the home and she states family is not able to provide that currently.  She feels that best option may be placement in vent SNF in [place] or [place] where he will be close to family.  She has not discussed this with pt's son yet.  CM called son, Alla German, and left message about above, requested call back to discuss.

## 2015-10-15 NOTE — Progress Notes (Signed)
Internal Medicine Attending  Date: 10/15/2015  Patient name: Anthony Avila Medical record number: [id number] Date of birth: 09-11-1945 Age: 70 y.o. Gender: male  I saw and evaluated the patient. I reviewed the resident's note by Dr. Allena Katz and I agree with the resident's findings and plans as documented in his progress note.  [name] was again unchanged when seen on rounds this morning. Apparently the level of commitment from the family caregivers necessary to have him return home may be more than can be provided at this time. I think it is prudent to begin looking for placement in a chronic vent facility as suggested, closer to patient's family. We are continuing current therapy for his pneumonia and other supportive care. We have no plans on weaning him from the ventilator during this hospitalization as he has a significant pneumonia and a history of easy mucus plugging, thus any attempts at weaning are inappropriate at this time.

## 2015-10-15 NOTE — Clinical Social Work Note (Signed)
CSW continuing to follow patient's progress in case he needs to go to vent snf.  Ervin Knack. Tyion Boylen, MSW, Theresia Majors [phone] 10/15/2015 5:48 PM

## 2015-10-16 LAB — BASIC METABOLIC PANEL
Anion gap: 6 (ref 5–15)
BUN: 23 mg/dL — ABNORMAL HIGH (ref 6–20)
CO2: 27 mmol/L (ref 22–32)
Calcium: 9 mg/dL (ref 8.9–10.3)
Chloride: 104 mmol/L (ref 101–111)
Creatinine, Ser: 0.71 mg/dL (ref 0.61–1.24)
GFR calc Af Amer: >60 mL/min (ref >60)
GFR calc non Af Amer: >60 mL/min (ref >60)
Glucose, Bld: 165 mg/dL — ABNORMAL HIGH (ref 65–99)
Potassium: 4.1 mmol/L (ref 3.5–5.1)
Sodium: 137 mmol/L (ref 135–145)

## 2015-10-16 LAB — GLUCOSE, CAPILLARY
GLUCOSE-CAPILLARY: 100 mg/dL — AB (ref 65–99)
GLUCOSE-CAPILLARY: 152 mg/dL — AB (ref 65–99)
GLUCOSE-CAPILLARY: 153 mg/dL — AB (ref 65–99)
GLUCOSE-CAPILLARY: 178 mg/dL — AB (ref 65–99)
Glucose-Capillary: 114 mg/dL — ABNORMAL HIGH (ref 65–99)
Glucose-Capillary: 158 mg/dL — ABNORMAL HIGH (ref 65–99)
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
## 2015-10-17 LAB — CBC
HCT: 25.5 % — ABNORMAL LOW (ref 39.0–52.0)
HEMOGLOBIN: 7.6 g/dL — AB (ref 13.0–17.0)
MCH: 25.5 pg — ABNORMAL LOW (ref 26.0–34.0)
MCHC: 29.8 g/dL — AB (ref 30.0–36.0)
MCV: 85.6 fL (ref 78.0–100.0)
Platelets: 245 10*3/uL (ref 150–400)
RBC: 2.98 MIL/uL — ABNORMAL LOW (ref 4.22–5.81)
RDW: 18.5 % — ABNORMAL HIGH (ref 11.5–15.5)
WBC: 5.7 10*3/uL (ref 4.0–10.5)

## 2015-10-17 LAB — GLUCOSE, CAPILLARY
GLUCOSE-CAPILLARY: 110 mg/dL — AB (ref 65–99)
GLUCOSE-CAPILLARY: 120 mg/dL — AB (ref 65–99)
Glucose-Capillary: 132 mg/dL — ABNORMAL HIGH (ref 65–99)
Glucose-Capillary: 168 mg/dL — ABNORMAL HIGH (ref 65–99)

## 2015-10-17 NOTE — Progress Notes (Signed)
Pharmacy Antibiotic Note   Anthony Avila is a 70 y.o. male admitted on 10/05/2015 with pneumonia. Pharmacy has been consulted for Gentamicin dosing. Pt has history of multi-drug resistant Pseudomonas and Stenotrophomonas.    Meropenem/Gentamicin D#12 for pseudomonas PNA/UTI - Afebrile, WBC WNL, Scr 0.7 (Quad, this appears to be his baseline), LA 1.90 - PSA sensitive to meropenem on 2/2 per MD but unclear if this was treated PSA MDR now, still with heavy amts of secretions *asked lab to test avytaz and zerbaxa  Zosyn x 1 2/14 Vanc x 1 2/14  Meropenem 2/15>>2/21 Gentamicin 2/15>> 2/16 gent random = 4.8 after 560 mg x 1 dose 2/18 gent trough = 2.1, gent peak 6.9 on gent 100 mg q12h Changed to Gent 120 mg q18h for peak ~ 8, trough < 2      2/23 gent trough = 1.9 on gent  q18h  2/16 resp >> pseudomonas, s-amg, i-cipro 2/14 urine>> negative 2/14 blood x2>> 1/2 gram + cocci in chains/pairs>>reincubate>>diphtheroids,strep viridans 11/1 urine >> negative 10/26 resp >> pseudomonas, stenotrophomonas (pseudo S - cipro, gent, tob, steno S - levaquin, bactrim) 10/25 resp >> pseudomonas (S-cipro, gent) 10/22 blood x2 >> negative  10/11 resp >> pseudomonas (S-cipro, gent, imi) 10/2 urine >> pseudomonas (S-cipro, gent, imi) 10/2 blood >> 1/2 CoNS  Plan: - Continue Gentamicin 120 mg IV q18h with planned stop date of 2/28 - monitor renal function, clinical improvement   Height:  (188 cm) Weight: 166 lb 3.6 oz (75.4 kg) IBW/kg (Calculated) : 82.2  Temp (24hrs), Avg:98.6 F (37 C), Min:98.4 F (36.9 C), Max:98.8 F (37.1 C)   Recent Labs Lab 10/12/15 0425 10/13/15 0532 10/14/15 0400 10/15/15 0400 10/16/15 0415 10/17/15 0417  WBC  --   --   --   --   --  5.7  CREATININE 0.56* 0.66  --  0.91 0.71  --   GENTTROUGH  --   --  1.9  --   --   --     Estimated Creatinine Clearance: 94.3 mL/min (by C-G formula based on Cr of 0.71).    No Known Allergies  Arcola Jansky,  PharmD Clinical Pharmacy Resident Pager: [phone]  10/17/2015 2:09 PM

## 2015-10-17 NOTE — Progress Notes (Addendum)
I was paged by RN to speak with patient's son over his concern for DNR status. He has stated that his father's and his families wishes are to continue aggressive treatment, (CPR, cardioversion, pressors) in the event that patient should lose pulse/ require the above interventions. He has stated that they would want these aggressive measures to continue up to 4 minutes. If return of spontaneous circulation is not achieved by 4 minutes, they would want to withdraw care.   I asked the patient specifically if he would want this type of aggressive care, he says yes. He also confirms that he would want to withdraw care after 4 minutes of CPR if ROSC is not achieved. I discussed the risks of these heroic measures with both the patient and son. They still want to proceed with change in code status.  Patient will be changed to PARTIAL CODE status with specific maximum duration of FOUR MINUTES if no return of spontaneous circulation achieved.
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
## 2015-10-18 DIAGNOSIS — F419 Anxiety disorder, unspecified: Secondary | ICD-10-CM

## 2015-10-18 DIAGNOSIS — R52 Pain, unspecified: Secondary | ICD-10-CM

## 2015-10-18 DIAGNOSIS — E119 Type 2 diabetes mellitus without complications: Secondary | ICD-10-CM

## 2015-10-18 LAB — GLUCOSE, CAPILLARY
GLUCOSE-CAPILLARY: 126 mg/dL — AB (ref 65–99)
GLUCOSE-CAPILLARY: 127 mg/dL — AB (ref 65–99)
GLUCOSE-CAPILLARY: 162 mg/dL — AB (ref 65–99)
GLUCOSE-CAPILLARY: 162 mg/dL — AB (ref 65–99)
GLUCOSE-CAPILLARY: 173 mg/dL — AB (ref 65–99)
Glucose-Capillary: 143 mg/dL — ABNORMAL HIGH (ref 65–99)
Glucose-Capillary: 146 mg/dL — ABNORMAL HIGH (ref 65–99)
Glucose-Capillary: 174 mg/dL — ABNORMAL HIGH (ref 65–99)

## 2015-10-18 LAB — BASIC METABOLIC PANEL
Anion gap: 11 (ref 5–15)
BUN: 23 mg/dL — ABNORMAL HIGH (ref 6–20)
CHLORIDE: 99 mmol/L — AB (ref 101–111)
CO2: 28 mmol/L (ref 22–32)
CREATININE: 0.69 mg/dL (ref 0.61–1.24)
Calcium: 8.9 mg/dL (ref 8.9–10.3)
GFR calc non Af Amer: >60 mL/min (ref >60)
GLUCOSE: 134 mg/dL — AB (ref 65–99)
Potassium: 3.8 mmol/L (ref 3.5–5.1)
Sodium: 138 mmol/L (ref 135–145)

## 2015-10-18 MED ORDER — GENTAMICIN SULFATE 40 MG/ML IJ SOLN
120.0000 mg | INTRAVENOUS | Status: AC
  Administered 2015-10-18 – 2015-10-19 (×2): 120 mg via INTRAVENOUS
  Filled 2015-10-18 (×3): qty 3

## 2015-10-18 NOTE — Clinical Social Work Note (Addendum)
CSW attempted to contact patient's son to discuss Vent SNF placements, message left on patient's phone.  Patient's son was requesting quote from [facility] private pay rate.  CSW to continue to help patient and family with vent SNF placement options if patient is not able to go home with home health.  Ervin Knack. Vasil Juhasz, MSW, Theresia Majors [phone] 10/18/2015 5:57 PM
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
## 2015-10-18 NOTE — Progress Notes (Signed)
Internal Medicine Attending  Date: 10/18/2015  Patient name: Anthony Avila Medical record number: [id number] Date of birth: 08-01-1946 Age: 70 y.o. Gender: male  I saw and evaluated the patient. I reviewed the resident's note by Dr. Allena Katz and I agree with the resident's findings and plans as documented in his progress note.  [name] was without complaints this morning when he was seen on rounds. Exam is unchanged. Tomorrow will be the last day of the gentamicin. We continue to work on arrangements for transfer to home to be managed by his family. In the meantime we'll continue current supportive care.

## 2015-10-19 DIAGNOSIS — R400 Somnolence: Secondary | ICD-10-CM

## 2015-10-19 LAB — GLUCOSE, CAPILLARY
GLUCOSE-CAPILLARY: 121 mg/dL — AB (ref 65–99)
GLUCOSE-CAPILLARY: 110 mg/dL — AB (ref 65–99)
Glucose-Capillary: 169 mg/dL — ABNORMAL HIGH (ref 65–99)
Glucose-Capillary: 130 mg/dL — ABNORMAL HIGH (ref 65–99)
Glucose-Capillary: 167 mg/dL — ABNORMAL HIGH (ref 65–99)

## 2015-10-19 NOTE — Progress Notes (Signed)
Internal Medicine Attending  Date: 10/19/2015  Patient name: Anthony Avila Medical record number: [id number] Date of birth: 06-Dec-1945 Age: 70 y.o. Gender: male  I saw and evaluated the patient. I reviewed the resident's note by Dr. Allena Katz and I agree with the resident's findings and plans as documented in his progress note.  Mr. Simkins remains in surprisingly good spirits. He also is stable from a pulmonary standpoint as we are completing the antibiotic course. At this point, placement is the issue keeping him in the hospital. We will continue current supportive care.
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
## 2015-10-20 LAB — GLUCOSE, CAPILLARY
GLUCOSE-CAPILLARY: 132 mg/dL — AB (ref 65–99)
GLUCOSE-CAPILLARY: 121 mg/dL — AB (ref 65–99)
GLUCOSE-CAPILLARY: 144 mg/dL — AB (ref 65–99)
GLUCOSE-CAPILLARY: 131 mg/dL — AB (ref 65–99)
Glucose-Capillary: 109 mg/dL — ABNORMAL HIGH (ref 65–99)
Glucose-Capillary: 162 mg/dL — ABNORMAL HIGH (ref 65–99)
Glucose-Capillary: 162 mg/dL — ABNORMAL HIGH (ref 65–99)

## 2015-10-20 LAB — BASIC METABOLIC PANEL
ANION GAP: 8 (ref 5–15)
BUN: 21 mg/dL — AB (ref 6–20)
CALCIUM: 9 mg/dL (ref 8.9–10.3)
CO2: 29 mmol/L (ref 22–32)
Chloride: 101 mmol/L (ref 101–111)
Creatinine, Ser: 0.66 mg/dL (ref 0.61–1.24)
GFR calc Af Amer: >60 mL/min (ref >60)
GFR calc non Af Amer: >60 mL/min (ref >60)
GLUCOSE: 142 mg/dL — AB (ref 65–99)
Potassium: 3.7 mmol/L (ref 3.5–5.1)
Sodium: 138 mmol/L (ref 135–145)

## 2015-10-20 MED ORDER — POLYETHYLENE GLYCOL 3350 17 G PO PACK: 17.0000 g | PACK | Freq: Two times a day (BID) | ORAL | Status: AC

## 2015-10-20 MED ORDER — ENOXAPARIN SODIUM 30 MG/0.3ML ~~LOC~~ SOLN: 40.0000 mg | SUBCUTANEOUS | Status: AC

## 2015-10-20 MED ORDER — INSULIN GLARGINE 100 UNIT/ML ~~LOC~~ SOLN: 20.0000 [IU] | Freq: Every day | SUBCUTANEOUS | Status: DC

## 2015-10-20 MED ORDER — BACLOFEN 10 MG PO TABS: 10.0000 mg | ORAL_TABLET | Freq: Four times a day (QID) | ORAL | Status: AC

## 2015-10-20 MED ORDER — PANTOPRAZOLE SODIUM 40 MG PO PACK: 40.0000 mg | PACK | Freq: Every day | ORAL | Status: AC

## 2015-10-20 MED ORDER — ALPRAZOLAM 0.5 MG PO TABS: 0.5000 mg | ORAL_TABLET | Freq: Three times a day (TID) | ORAL | Status: DC | PRN

## 2015-10-20 MED ORDER — QUETIAPINE FUMARATE 50 MG PO TABS: 50.0000 mg | ORAL_TABLET | Freq: Every day | ORAL | Status: AC

## 2015-10-20 NOTE — Clinical Social Work Note (Addendum)
CSW attempted to call patient's son Alla German again to discuss discharge planning, CSW left message for patient's son to call back.  CSW attempted to call patient's brother Tresa Endo to discuss discharge planning left a message on his phone awaiting call back from family.  CSW contacted [facility] and spoke to Francisco Capuchin to discuss if patient will be able to return she said they can take patient once a bed is available and if family agrees to placement at [facility].  5:25pm CSW received phone call back from patient's brother Tresa Endo to discuss SNF placement verse home health.  CSW informed patient's brother that [facility] is able to take patient back once a bed has opened up, and also informed him that there is a SNF in [place] that could potentially take patient once a bed is available.  CSW was informed by patient's brother that he will talk with patient's son this evening to determine what discharge plan will be.  CSW requested to have patient's brother or son call back in the morning with their decisoin.  CSW to continue to follow patient's progress.  Ervin Knack. Dontrez Pettis, MSW, Theresia Majors [phone] 10/20/2015 11:31 AM
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
## 2015-10-20 NOTE — Progress Notes (Signed)
Internal Medicine Attending  Date: 10/20/2015  Patient name: Anthony Avila Medical record number: [id number] Date of birth: [birth year] Age: 70 y.o. Gender: male  I saw and evaluated the patient. I reviewed the resident's note by Dr. Allena Katz and I agree with the resident's findings and plans as documented in his progress note.  [name] remains unchanged on our supportive therapy. He has completed the IV antibiotics. We are hoping to have a final decision on disposition in the very near future.

## 2015-10-21 LAB — GLUCOSE, CAPILLARY
GLUCOSE-CAPILLARY: 142 mg/dL — AB (ref 65–99)
GLUCOSE-CAPILLARY: 111 mg/dL — AB (ref 65–99)
GLUCOSE-CAPILLARY: 127 mg/dL — AB (ref 65–99)
GLUCOSE-CAPILLARY: 166 mg/dL — AB (ref 65–99)
Glucose-Capillary: 149 mg/dL — ABNORMAL HIGH (ref 65–99)
Glucose-Capillary: 173 mg/dL — ABNORMAL HIGH (ref 65–99)

## 2015-10-21 LAB — CULTURE, RESPIRATORY W GRAM STAIN
Gram Stain: NONE SEEN
Special Requests: NORMAL

## 2015-10-21 LAB — CULTURE, RESPIRATORY

## 2015-10-21 MED ORDER — ACETAMINOPHEN 160 MG/5ML PO SOLN
650.0000 mg | Freq: Four times a day (QID) | ORAL | Status: DC | PRN
  Administered 2015-10-21 – 2015-10-25 (×3): 650 mg via ORAL
  Filled 2015-10-21 (×4): qty 20.3
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
## 2015-10-21 NOTE — Progress Notes (Signed)
Called to room by RN stat d/t pt desat in 40's.  Upon entering room- sat 70% and RN ambu bagging on 100% fio2.  Per RN, pt was just turned side-to-side for cleaning.  Sx for copious secretions.  Pt placed on vent and recruitment maneuver performed x 2  Minutes.  Pt placed back on previous vent settings.  Sat continues to remain at 100%, [facility] coarse t/o diminished.  No distress presently noted.

## 2015-10-21 NOTE — Progress Notes (Signed)
Internal Medicine Attending  Date: 10/21/2015  Patient name: Anthony Avila Medical record number: [id number] Date of birth: [birth year] Age: 70 y.o. Gender: male  I saw and evaluated the patient. I reviewed the resident's note by Dr. Allena Katz and I agree with the resident's findings and plans as documented in his progress note.  Anthony Avila status is unchanged this morning. The main issue at this point remains placement. We are hopeful that he can be transferred either to a skilled nursing facility or home in the near future before he develops a hospital associated complication. We are continuing current supportive care.
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
## 2015-10-21 NOTE — Clinical Social Work Note (Signed)
Clinical Social Work Assessment  Patient Details  Name: Anthony Avila MRN: [id number] Date of Birth: 1946-04-02  Date of referral:  10/20/15               Reason for consult:  Facility Placement                Permission sought to share information with:  Family Supports, Magazine features editor Permission granted to share information::  Yes, Verbal Permission Granted  Name::     Colm, Lyford [phone] or [phone] or Gregorey, Nabor brother [phone]  Agency::  Vent SNF admissions  Relationship::     Contact Information:     Housing/Transportation Living arrangements for the past 2 months:  Skilled Nursing Facility Source of Information:  Adult Children Patient Interpreter Needed:  None Criminal Activity/Legal Involvement Pertinent to Current Situation/Hospitalization:  No - Comment as needed Significant Relationships:  Adult Children, Siblings Lives with:  Facility Resident Do you feel safe going back to the place where you live?  Yes Need for family participation in patient care:  Yes (Comment)  Care giving concerns:  Patient is on a vent, patient's son is trying to make arrangements for home Health with vent, but is open to returning to vent snf   Social Worker assessment / plan:  Patient is a 70 year old male who is on a ventilator, patient's son is the main contact.  Patient is alert, but unable to speak well due to vent.  Patient's son would like to set up patient for home with home vent, but is working on trying to make arrangements to have someone be able to stay with him.  Patient's son stated he has been at the vent SNF and is open to having him return if they can not make arrangement for home.  Patient's son states he works and patient's other family members work as well, so it is difficult to make arrangements, but he is looking into some other options.  CSW spoke to patient's brother as well who lives locally and he is aware that patient's discharge plan is  still unknown.  Patient's brother said he will talk to patient's son to get an update on progress.  Employment status:  Retired Health and safety inspector:  Medicare PT Recommendations:  Not assessed at this time Information / Referral to community resources:  Skilled Nursing Facility  Patient/Family's Response to care: Patient and family would like to return back home if possible, but is open to going to a Vent SNF if he has to.  Patient/Family's Understanding of and Emotional Response to Diagnosis, Current Treatment, and Prognosis:  Patient and family aware of current diagnosis and treatment plan.  Emotional Assessment Appearance:  Appears stated age Attitude/Demeanor/Rapport:    Affect (typically observed):  Appropriate Orientation:  Oriented to Place, Oriented to Self, Oriented to  Time, Oriented to Situation Alcohol / Substance use:  Not Applicable Psych involvement (Current and /or in the community):  No (Comment)  Discharge Needs  Concerns to be addressed:  Adjustment to Illness Readmission within the last 30 days:  No Current discharge risk:  None Barriers to Discharge:  Continued Medical Work up, [employer] not available   Darleene Cleaver, LCSWA 10/21/2015, 8:00 PM

## 2015-10-21 NOTE — Clinical Social Work Note (Signed)
CSW was informed by case manager that son is still trying to work on making arrangements for home health.  CSW to continue to follow patient's progress in case vent snf is needed.  Ervin Knack. Kanden Carey, MSW, [facility] Majors [phone] 10/21/2015 7:16 PM

## 2015-10-21 NOTE — Progress Notes (Signed)
S/P turning patient for cleaning and dressing change, patient desated to 45 and suctioning and bagging was done. O2 sats began to increase.  Copious thick secretions were noted in ETT.  Called Respiratory Therapy stat to bedside to assist. Will continue to monitor for changes.

## 2015-10-21 NOTE — Progress Notes (Signed)
Home on vent on hold because unable to provide 2 fam members to be trained on vent to cover in case private duty nurses were unable to get there. Son e mailed cm today that he is working on other options. Enc son to get in touch w Child psychotherapist. sw checking on cost and snf vent facilities that maybe able to take pt. Will cont to follow.

## 2015-10-22 LAB — BASIC METABOLIC PANEL
Anion gap: 13 (ref 5–15)
BUN: 21 mg/dL — AB (ref 6–20)
CALCIUM: 9.3 mg/dL (ref 8.9–10.3)
CO2: 28 mmol/L (ref 22–32)
CREATININE: 0.71 mg/dL (ref 0.61–1.24)
Chloride: 98 mmol/L — ABNORMAL LOW (ref 101–111)
GFR calc Af Amer: >60 mL/min (ref >60)
GLUCOSE: 115 mg/dL — AB (ref 65–99)
Potassium: 3.5 mmol/L (ref 3.5–5.1)
Sodium: 139 mmol/L (ref 135–145)

## 2015-10-22 LAB — GLUCOSE, CAPILLARY
GLUCOSE-CAPILLARY: 141 mg/dL — AB (ref 65–99)
GLUCOSE-CAPILLARY: 153 mg/dL — AB (ref 65–99)
GLUCOSE-CAPILLARY: 165 mg/dL — AB (ref 65–99)
Glucose-Capillary: 117 mg/dL — ABNORMAL HIGH (ref 65–99)
Glucose-Capillary: 114 mg/dL — ABNORMAL HIGH (ref 65–99)
Glucose-Capillary: 127 mg/dL — ABNORMAL HIGH (ref 65–99)
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
## 2015-10-22 NOTE — Progress Notes (Signed)
Internal Medicine Attending  Date: 10/22/2015  Patient name: Anthony Avila Medical record number: [id number] Date of birth: 08/04/1946 Age: 70 y.o. Gender: male  I saw and evaluated the patient. I reviewed the resident's note by Dr. Allena KatzPatel and I agree with the resident's findings and plans as documented in his progress note.  Anthony Avila had an episode of desaturation yesterday when turned to his side. After suctioning secretions his O2 sats returned to 100% on previous vent settings. It appears the family is no closer to making arrangements to have him return home despite reported efforts over greater than one week. It now seems time that we start focusing on returning Anthony Avila to a skilled nursing facility that accepts ventilators and we will ask Case Management to begin this process, if not already doing so. In the meantime, we'll continue current supportive care.

## 2015-10-23 LAB — GLUCOSE, CAPILLARY
GLUCOSE-CAPILLARY: 83 mg/dL (ref 65–99)
GLUCOSE-CAPILLARY: 143 mg/dL — AB (ref 65–99)
GLUCOSE-CAPILLARY: 129 mg/dL — AB (ref 65–99)
GLUCOSE-CAPILLARY: 138 mg/dL — AB (ref 65–99)
Glucose-Capillary: 165 mg/dL — ABNORMAL HIGH (ref 65–99)
Glucose-Capillary: 113 mg/dL — ABNORMAL HIGH (ref 65–99)

## 2015-10-23 MED ORDER — INSULIN GLARGINE 100 UNIT/ML ~~LOC~~ SOLN
23.0000 [IU] | Freq: Every day | SUBCUTANEOUS | Status: DC
  Administered 2015-10-23 – 2015-11-25 (×33): 23 [IU] via SUBCUTANEOUS
  Filled 2015-10-23 (×36): qty 0.23

## 2015-10-23 NOTE — NC FL2 (Signed)
[place] MEDICAID FL2 LEVEL OF CARE SCREENING TOOL     IDENTIFICATION  Patient Name: Anthony Avila Birthdate: 09/28/1945 Sex: male Admission Date (Current Location): 10/05/2015  [facility] [place]   Facility and Address:  [facility], [address], [place], [place] [address]      Provider Number: [id number]  Attending Physician Name and Address:  Doneen PoissonLawrence Klima, MD  Relative Name and Phone Number:       Current Level of Care: Hospital Recommended Level of Care: Skilled Nursing Facility Prior Approval Number:    Date Approved/Denied:   PASRR Number: [id number]  Discharge Plan: SNF    Current Diagnoses: Patient Active Problem List   Diagnosis Date Noted  . Altered mental status   . Sacral decubitus ulcer   . Quadriplegia (HCC)   . Chronic neuromuscular respiratory failure (HCC)   . Chronic respiratory failure (HCC)   . Decubitus ulcer of sacral region, stage 3 (HCC)   . Diabetes mellitus (HCC) 10/05/2015  . Chronic constipation 10/05/2015  . Acute on chronic respiratory failure with hypoxemia (HCC)   . Acute encephalopathy   . Anasarca   . 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 Acute on chronic respiratory failure with hypoxia (HCC) 06/16/2015  . VAP (ventilator-associated pneumonia) (HCC)   . Lung collapse   . 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 Acute respiratory failure (HCC)   . Neck pain   . Ventilator dependence (HCC)   . Anemia of chronic disease   . HIV (human immunodeficiency virus infection) (HCC)   . Palliative care encounter   . 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 Atelectasis   . Apnea   . Arm swelling   . Leaking PEG tube (HCC)   . Pulmonary edema   . HCAP (healthcare-associated pneumonia) 05/23/2015    Orientation RESPIRATION BLADDER Height & Weight     Self, Situation  Tracheostomy, Vent Incontinent, Indwelling catheter  Weight: 163 lb (73.936 kg) Height:  6\' 2"  (188 cm)  BEHAVIORAL SYMPTOMS/MOOD NEUROLOGICAL BOWEL NUTRITION STATUS      Incontinent Feeding tube  AMBULATORY STATUS COMMUNICATION OF NEEDS Skin   Total Care Verbally (Trouble speaking due to vent) Other (Comment) (Unstageable on right buttocks with foam dressing. Stage 2 on left buttocks with foam dressing. Stage 4 on buttocks with wet to dry dressing.)                       Personal Care Assistance Level of Assistance  Total care       Total Care Assistance: Maximum assistance   Functional Limitations Info  Sight, Hearing, Speech Sight Info: Adequate Hearing Info: Adequate Speech Info: Impaired    SPECIAL CARE FACTORS FREQUENCY  PT (By licensed PT), OT (By licensed OT)                    Contractures      Additional Factors Info  Code Status, Allergies, Isolation Precautions, Psychotropic, Suctioning Needs, Insulin Sliding Scale Code Status Info: Partial Allergies Info: NKDA Psychotropic Info: Klonopin, Cymbalta Insulin Sliding Scale Info: Dose: 0-15 Units Freq: 6 times per day Route: [place] Isolation Precautions Info: MDRO     Current Medications (10/23/2015):  This is the current hospital active medication list Current Facility-Administered Medications  Medication Dose Route Frequency Provider Last Rate Last Dose  . 0.9 %  sodium chloride infusion   Intravenous Continuous  PRN Doneen Poisson, MD 10 mL/hr at 10/21/15 2000    . acetaminophen (TYLENOL) solution 650 mg  650 mg Oral Q6H PRN Doneen Poisson, MD   650 mg at 10/22/15 1409  . albuterol (PROVENTIL) (2.5 MG/3ML) 0.083% nebulizer solution 2.5 mg  2.5 mg Nebulization TID Marjan Rabbani, MD   2.5 mg at 10/23/15 1346  . ALPRAZolam Prudy Feeler) tablet 0.5 mg  0.5 mg Per Tube TID PRN Darreld Mclean, MD   0.5 mg at 10/23/15 0041  . antiseptic oral rinse solution (CORINZ)  7 mL Mouth Rinse QID Doneen Poisson, MD   7 mL at 10/23/15 0456  . baclofen (LIORESAL) tablet 10 mg  10 mg  Per Tube QID Otis Brace, MD   10 mg at 10/23/15 1205  . bisacodyl (DULCOLAX) suppository 10 mg  10 mg Rectal QHS Darreld Mclean, MD   10 mg at 10/07/15 2200  . chlorhexidine gluconate (PERIDEX) 0.12 % solution 15 mL  15 mL Mouth Rinse BID Doneen Poisson, MD   15 mL at 10/23/15 0927  . clonazePAM (KLONOPIN) tablet 1 mg  1 mg Per Tube Q6H Marjan Rabbani, MD   1 mg at 10/23/15 1205  . dolutegravir (TIVICAY) tablet 50 mg  50 mg Oral Daily Marjan Rabbani, MD   50 mg at 10/23/15 0925  . DULoxetine (CYMBALTA) DR capsule 60 mg  60 mg Oral Daily Marjan Rabbani, MD   60 mg at 10/23/15 0925  . emtricitabine-tenofovir AF (DESCOVY) 200-25 MG per tablet 1 tablet  1 tablet Per Tube Daily Otis Brace, MD   1 tablet at 10/23/15 0928  . enoxaparin (LOVENOX) injection 40 mg  40 mg Subcutaneous Daily Marjan Rabbani, MD   40 mg at 10/23/15 0924  . feeding supplement (VITAL AF 1.2 CAL) liquid 1,000 mL  1,000 mL Per Tube Continuous Doneen Poisson, MD 75 mL/hr at 10/22/15 2300 1,000 mL at 10/22/15 2300  . free water 200 mL  200 mL Per Tube 3 times per day Alexa Lucrezia Starch, MD   200 mL at 10/22/15 2300  . hydroxypropyl methylcellulose / hypromellose (ISOPTO TEARS / GONIOVISC) 2.5 % ophthalmic solution 2 drop  2 drop Both Eyes QID Otis Brace, MD   2 drop at 10/23/15 0928  . insulin aspart (novoLOG) injection 0-15 Units  0-15 Units Subcutaneous 6 times per day Servando Snare, MD   3 Units at 10/23/15 0456  . insulin glargine (LANTUS) injection 23 Units  23 Units Subcutaneous QHS Lora Paula, MD      . labetalol (NORMODYNE,TRANDATE) injection 5 mg  5 mg Intravenous Once John Giovanni, MD   5 mg at 10/06/15 2315  . ondansetron (ZOFRAN) tablet 4 mg  4 mg Oral Q6H PRN Marjan Rabbani, MD       Or  . ondansetron (ZOFRAN) injection 4 mg  4 mg Intravenous Q6H PRN Marjan Rabbani, MD      . oxyCODONE-acetaminophen (PERCOCET/ROXICET) 5-325 MG per tablet 1 tablet  1 tablet Per Tube Q6H PRN Jana Half, MD   1 tablet  at 10/23/15 1205  . pantoprazole sodium (PROTONIX) 40 mg/20 mL oral suspension 40 mg  40 mg Per Tube Daily Doneen Poisson, MD   40 mg at 10/23/15 0925  . polyethylene glycol (MIRALAX / GLYCOLAX) packet 17 g  17 g Per Tube BID Darreld Mclean, MD   17 g at 10/23/15 0929  . QUEtiapine (SEROQUEL) tablet 50 mg  50 mg Per Tube QHS Otis Brace, MD   50 mg  at 10/22/15 2300  . senna-docusate (Senokot-S) tablet 1 tablet  1 tablet Per Tube BID Darreld Mclean, MD   1 tablet at 10/23/15 0926  . sodium chloride flush (NS) 0.9 % injection 10-40 mL  10-40 mL Intracatheter Q12H Doneen Poisson, MD   30 mL at 10/23/15 1000  . sodium chloride flush (NS) 0.9 % injection 10-40 mL  10-40 mL Intracatheter PRN Doneen Poisson, MD   20 mL at 10/23/15 0007  . sodium chloride flush (NS) 0.9 % injection 3 mL  3 mL Intravenous Q12H Marjan Rabbani, MD   3 mL at 10/23/15 1000     Discharge Medications: Please see discharge summary for a list of discharge medications.  Relevant Imaging Results:  Relevant Lab Results:   Additional Information    Marnee Spring, LCSW Weekend Coverage
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
## 2015-10-24 LAB — GLUCOSE, CAPILLARY
GLUCOSE-CAPILLARY: 112 mg/dL — AB (ref 65–99)
GLUCOSE-CAPILLARY: 158 mg/dL — AB (ref 65–99)
GLUCOSE-CAPILLARY: 128 mg/dL — AB (ref 65–99)
GLUCOSE-CAPILLARY: 141 mg/dL — AB (ref 65–99)
Glucose-Capillary: 88 mg/dL (ref 65–99)
Glucose-Capillary: 119 mg/dL — ABNORMAL HIGH (ref 65–99)

## 2015-10-24 MED ORDER — ALBUTEROL SULFATE (2.5 MG/3ML) 0.083% IN NEBU
2.5000 mg | INHALATION_SOLUTION | Freq: Four times a day (QID) | RESPIRATORY_TRACT | Status: DC
  Administered 2015-10-24 – 2015-11-25 (×128): 2.5 mg via RESPIRATORY_TRACT
  Filled 2015-10-24 (×131): qty 3
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
## 2015-10-24 NOTE — Progress Notes (Signed)
Report rec'd from [facility] nurse. Awaiting patient arrival.

## 2015-10-25 ENCOUNTER — Inpatient Hospital Stay (HOSPITAL_COMMUNITY): Payer: Medicare Other

## 2015-10-25 DIAGNOSIS — D72829 Elevated white blood cell count, unspecified: Secondary | ICD-10-CM

## 2015-10-25 DIAGNOSIS — R509 Fever, unspecified: Secondary | ICD-10-CM

## 2015-10-25 DIAGNOSIS — R40241 Glasgow coma scale score 13-15, unspecified time: Secondary | ICD-10-CM

## 2015-10-25 DIAGNOSIS — J189 Pneumonia, unspecified organism: Secondary | ICD-10-CM

## 2015-10-25 LAB — URINALYSIS, ROUTINE W REFLEX MICROSCOPIC
Bilirubin Urine: NEGATIVE
GLUCOSE, UA: NEGATIVE mg/dL
Ketones, ur: NEGATIVE mg/dL
Nitrite: NEGATIVE
PH: 6 (ref 5.0–8.0)
PROTEIN: 100 mg/dL — AB
Specific Gravity, Urine: 1.034 — ABNORMAL HIGH (ref 1.005–1.030)

## 2015-10-25 LAB — URINE MICROSCOPIC-ADD ON

## 2015-10-25 LAB — GLUCOSE, CAPILLARY
GLUCOSE-CAPILLARY: 130 mg/dL — AB (ref 65–99)
Glucose-Capillary: 167 mg/dL — ABNORMAL HIGH (ref 65–99)
Glucose-Capillary: 141 mg/dL — ABNORMAL HIGH (ref 65–99)
Glucose-Capillary: 179 mg/dL — ABNORMAL HIGH (ref 65–99)
Glucose-Capillary: 147 mg/dL — ABNORMAL HIGH (ref 65–99)

## 2015-10-25 MED ORDER — ETOMIDATE 2 MG/ML IV SOLN: 0.3000 mg/kg | Freq: Once | INTRAVENOUS | Status: DC

## 2015-10-25 MED ORDER — ETOMIDATE 2 MG/ML IV SOLN
20.0000 mg | Freq: Once | INTRAVENOUS | Status: AC
  Administered 2015-10-25: 20 mg via INTRAVENOUS

## 2015-10-25 MED ORDER — TOBRAMYCIN 300 MG/5ML IN NEBU
300.0000 mg | INHALATION_SOLUTION | Freq: Two times a day (BID) | RESPIRATORY_TRACT | Status: DC
  Administered 2015-10-25 – 2015-11-08 (×28): 300 mg via RESPIRATORY_TRACT
  Filled 2015-10-25 (×34): qty 5

## 2015-10-25 MED ORDER — DEXTROSE 5 % IV SOLN
2.0000 g | Freq: Three times a day (TID) | INTRAVENOUS | Status: DC
  Administered 2015-10-25 – 2015-10-29 (×12): 2 g via INTRAVENOUS
  Filled 2015-10-25 (×13): qty 2

## 2015-10-25 MED ORDER — ACETYLCYSTEINE 20 % IN SOLN
4.0000 mL | Freq: Two times a day (BID) | RESPIRATORY_TRACT | Status: DC
  Administered 2015-10-26: 4 mL via RESPIRATORY_TRACT
  Filled 2015-10-25 (×2): qty 4

## 2015-10-25 MED ORDER — ACETAMINOPHEN 160 MG/5ML PO SOLN
650.0000 mg | Freq: Four times a day (QID) | ORAL | Status: DC | PRN
  Administered 2015-10-30 – 2015-11-10 (×12): 650 mg via ORAL
  Administered 2015-11-12 (×2): 500 mg via ORAL
  Administered 2015-11-14 – 2015-11-18 (×3): 650 mg via ORAL
  Filled 2015-10-25 (×18): qty 20.3

## 2015-10-25 NOTE — Progress Notes (Signed)
SATS began to drop to 84%, increased O2 to 60% in increments of 10 till patient SATS improved to 94%, will try to wean O2 later, patient has copious amounts of thick yellow secretions.

## 2015-10-25 NOTE — Progress Notes (Signed)
Internal Medicine Attending  Date: 10/25/2015  Patient name: Anthony Avila Medical record number: [id number] Date of birth: 11/03/1945 Age: 70 y.o. Gender: male  I saw and evaluated the patient. I reviewed the resident's note by Dr. Allena KatzPatel and I agree with the resident's findings and plans as documented in his progress note.  Anthony Avila's clinical condition has deteriorated over the last 24 hours. He now has a fever to 100.9, copious yellow secretions from the lung, and increasing oxygen requirements. Chest x-ray reveals a worsening in the left lower lobe basilar consolidation and urine has a leukocytosis suggestive of a urinary tract infection. It was a matter of time before he would aspirate and reinfect his lung so this is not a surprise. This time we will ask pulmonary medicine to consider doing a bronchoscopy specifically to the left lower lobe for washing or protective brush in order to definitively identify the offending agent, so that if it is multi-drug-resistant, we can trust the results. We are withholding antibiotics at this time as he is otherwise clinically stable and would like to get cultures off of antibiotics via the bronchoscopy. If he were to deteriorate before the bronchoscopy we would then empirically treat with IV aztreonam and inhaled tobramycin. We should continue to work on skilled nursing facility that accepts vents as we are hopeful that he will clinically respond to antibiotics as quickly as he did during the last episode.

## 2015-10-25 NOTE — Procedures (Signed)
Bronchoscopy Procedure Note Anthony Avila [id number] 01/20/1946  Procedure: Bronchoscopy Indications: Diagnostic evaluation of the airways, Obtain specimens for culture and/or other diagnostic studies and Remove secretions  Procedure Details Consent: Risks of procedure as well as the alternatives and risks of each were explained to the (patient/caregiver).  Consent for procedure obtained. Time Out: Verified patient identification, verified procedure, site/side was marked, verified correct patient position, special equipment/implants available, medications/allergies/relevent history reviewed, required imaging and test results available.  Performed  In preparation for procedure, patient was given 100% FiO2 and bronchoscope lubricated. Sedation: Etomidate  Airway entered and the following bronchi were examined: RUL, RML, RLL, LUL, LLL and Bronchi.   Procedures performed: Brushings performed- no Bronchoscope removed.  , Patient placed back on 100% FiO2 at conclusion of procedure.    Evaluation Hemodynamic Status: BP stable throughout; O2 sats: stable throughout Patient's Current Condition: stable Specimens:  Sent purulent fluid Complications: No apparent complications Patient did tolerate procedure well.   Nelda BucksFEINSTEIN,DANIEL J. 10/25/2015   1. Severe pus throughout with obstruction full BI, RLL, LEFT LL, upper division, suctioned clear 2. BAL LLL pus yellow  3. No lesions 4. Anthony Avila exits into airway with some granulation 33% obstructed, with next change, would benefit extra long  Keep peep 8 -10 for a few days Does not appear to be resolving this with systemic abx, neb? Will obtain pcxr  Mcarthur Rossettianiel J. Tyson AliasFeinstein, MD, FACP Pgr: [phone] [place] [facility]
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
## 2015-10-25 NOTE — Clinical Social Work Note (Signed)
Patient transferred from 2H28 to 2C09, unit CSW BeninJenna Holoman was updated.  This Child psychotherapistsocial worker to sign off.  Ervin KnackEric R. Zema Lizardo, MSW, Theresia MajorsLCSWA [phone] 10/25/2015 9:55 AM
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
## 2015-10-26 LAB — CBC
HCT: 26.2 % — ABNORMAL LOW (ref 39.0–52.0)
HEMOGLOBIN: 7.7 g/dL — AB (ref 13.0–17.0)
MCH: 24.9 pg — ABNORMAL LOW (ref 26.0–34.0)
MCHC: 29.4 g/dL — ABNORMAL LOW (ref 30.0–36.0)
MCV: 84.8 fL (ref 78.0–100.0)
PLATELETS: 295 10*3/uL (ref 150–400)
RBC: 3.09 MIL/uL — AB (ref 4.22–5.81)
RDW: 17.8 % — ABNORMAL HIGH (ref 11.5–15.5)
WBC: 8.3 10*3/uL (ref 4.0–10.5)

## 2015-10-26 LAB — GLUCOSE, CAPILLARY
GLUCOSE-CAPILLARY: 133 mg/dL — AB (ref 65–99)
GLUCOSE-CAPILLARY: 144 mg/dL — AB (ref 65–99)
GLUCOSE-CAPILLARY: 134 mg/dL — AB (ref 65–99)
Glucose-Capillary: 128 mg/dL — ABNORMAL HIGH (ref 65–99)
Glucose-Capillary: 129 mg/dL — ABNORMAL HIGH (ref 65–99)
Glucose-Capillary: 118 mg/dL — ABNORMAL HIGH (ref 65–99)

## 2015-10-26 LAB — BASIC METABOLIC PANEL
ANION GAP: 9 (ref 5–15)
BUN: 23 mg/dL — ABNORMAL HIGH (ref 6–20)
CHLORIDE: 101 mmol/L (ref 101–111)
CO2: 30 mmol/L (ref 22–32)
CREATININE: 0.78 mg/dL (ref 0.61–1.24)
Calcium: 9.2 mg/dL (ref 8.9–10.3)
GFR calc non Af Amer: >60 mL/min (ref >60)
Glucose, Bld: 140 mg/dL — ABNORMAL HIGH (ref 65–99)
POTASSIUM: 3.3 mmol/L — AB (ref 3.5–5.1)
SODIUM: 140 mmol/L (ref 135–145)

## 2015-10-26 MED ORDER — VANCOMYCIN HCL 10 G IV SOLR
1250.0000 mg | Freq: Once | INTRAVENOUS | Status: AC
  Administered 2015-10-26: 1250 mg via INTRAVENOUS
  Filled 2015-10-26: qty 1250
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
## 2015-10-26 NOTE — Progress Notes (Signed)
CSW attempted to call patient son, [name], at work and personal numbers- left messages. Attempted to contact to discuss pt disposition.  CSW will continue to follow.  Merlyn LotJenna Holoman, LCSWA Clinical Social Worker [phone]

## 2015-10-26 NOTE — Progress Notes (Signed)
Internal Medicine Attending  Date: 10/26/2015  Patient name: Anthony Avila Medical record number: [id number] Date of birth: 12/26/1945 Age: 70 y.o. Gender: male  I saw and evaluated the patient. I reviewed the resident's note by Dr. Allena KatzPatel and I agree with the resident's findings and plans as documented in his progress note.  Clinically Anthony Avila is slightly improved today. His FiO2 requirements of dropped from 0.6 to 0.4 with saturations at 100%. He has also been afebrile. He was started on inhaled tobramycin and IV aztreonam empirically while we await the results of the bronchial washings. Thus far we have a gram variable rod with specific identification and sensitivities pending. His blood culture 1 of 2 bottles in the aerobic component was positive for gram-positive cocci in clusters. We have added vancomycin pending identification and sensitivities of this organism. It appears we are continuing to have difficulty establishing decisions from the family and should now be concentrating on ventilator facility placement.  I will ask clinical social work to start moving in this direction as once we define his organisms in the lung and blood and start antibiotic therapy I anticipate he will be ready for transfer to a long care facility.

## 2015-10-26 NOTE — Progress Notes (Signed)
Utilization Review Completed.  
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
## 2015-10-26 NOTE — Progress Notes (Signed)
Blood culture result is gram positive cocci aerobic set, Dr. Allena KatzPatel was informed.

## 2015-10-27 LAB — GLUCOSE, CAPILLARY
GLUCOSE-CAPILLARY: 109 mg/dL — AB (ref 65–99)
GLUCOSE-CAPILLARY: 117 mg/dL — AB (ref 65–99)
Glucose-Capillary: 115 mg/dL — ABNORMAL HIGH (ref 65–99)
Glucose-Capillary: 122 mg/dL — ABNORMAL HIGH (ref 65–99)
Glucose-Capillary: 130 mg/dL — ABNORMAL HIGH (ref 65–99)
Glucose-Capillary: 94 mg/dL (ref 65–99)

## 2015-10-27 MED ORDER — POTASSIUM CHLORIDE 20 MEQ/15ML (10%) PO SOLN
40.0000 meq | Freq: Once | ORAL | Status: AC
  Administered 2015-10-27: 40 meq
  Filled 2015-10-27: qty 30

## 2015-10-27 MED ORDER — VANCOMYCIN HCL IN DEXTROSE 1-5 GM/200ML-% IV SOLN
1000.0000 mg | Freq: Two times a day (BID) | INTRAVENOUS | Status: DC
  Administered 2015-10-27 – 2015-10-28 (×2): 1000 mg via INTRAVENOUS
  Filled 2015-10-27 (×3): qty 200
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
## 2015-10-27 NOTE — Progress Notes (Signed)
PEEP changed from 8 to 5 per MD order. Vital signs currently stable. RT will continue to monitor.

## 2015-10-27 NOTE — Progress Notes (Signed)
Internal Medicine Attending  Date: 10/27/2015  Patient name: Anthony Avila Medical record number: [id number] Date of birth: 05/14/1946 Age: 70 y.o. Gender: male  I saw and evaluated the patient. I reviewed the resident's note by Dr. Allena KatzPatel and I agree with the resident's findings and plans as documented in his progress note.  Anthony Avila is improved from a pulmonary standpoint today with 100% saturation on a PEEP of 8 and an FiO2 of 0.4. This is markedly improved from 2 days ago. We will therefore decrease the PEEP to 5. If his O2 sats remained excellent despite the decrease in the PEEP we will return the FiO2 back to 0.3 tomorrow as per his previous settings prior to the most recent decompensation. The gram-positive cocci in one of the blood culture vials has yet to be identified. The organism in the BAL is Pseudomonas aeruginosa and the sensitivities are pending. We will continue with our current antibiotic regimen given his clinical improvement pending the identification of the bacteria in the blood culture as well as the sensitivities of the Pseudomonas from the BAL.

## 2015-10-27 NOTE — Progress Notes (Signed)
CSW attempted to call patient son again- he had texted this morning stating he would be calling today- have attempted to call cell phone multiple times and left messages, texted, called work number and left message.  CSW awaiting contact with son to confirm plan.  Per previous social worker [facility] continues to be able to take pt back pending bed availability.  Son is also apparently working towards transport home with home health services but have been unable to confirm if pt will have sufficient caregiving to return home.  CSW will continue to follow  Merlyn LotJenna Holoman, [facility]WA Clinical Social Worker [phone]

## 2015-10-28 DIAGNOSIS — B957 Other staphylococcus as the cause of diseases classified elsewhere: Secondary | ICD-10-CM

## 2015-10-28 LAB — CULTURE, BLOOD (ROUTINE X 2)

## 2015-10-28 LAB — GLUCOSE, CAPILLARY
GLUCOSE-CAPILLARY: 132 mg/dL — AB (ref 65–99)
Glucose-Capillary: 127 mg/dL — ABNORMAL HIGH (ref 65–99)
Glucose-Capillary: 155 mg/dL — ABNORMAL HIGH (ref 65–99)
Glucose-Capillary: 179 mg/dL — ABNORMAL HIGH (ref 65–99)
Glucose-Capillary: 185 mg/dL — ABNORMAL HIGH (ref 65–99)
Glucose-Capillary: 99 mg/dL (ref 65–99)

## 2015-10-28 MED ORDER — ADULT MULTIVITAMIN LIQUID CH
15.0000 mL | Freq: Every day | ORAL | Status: DC
  Administered 2015-10-28 – 2015-11-25 (×29): 15 mL
  Filled 2015-10-28 (×29): qty 15
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
## 2015-10-28 NOTE — Clinical Social Work Note (Addendum)
CSW attempted to contact patient's son, CSW unable to make contact, brother report that son is still working on making arrangements for home health.  Ervin KnackEric R. Saralynn Langhorst, MSW, Theresia MajorsLCSWA [phone] 10/28/2015 6:45 PM

## 2015-10-28 NOTE — Progress Notes (Signed)
Internal Medicine Attending  Date: 10/28/2015  Patient name: Anthony Avila Medical record number: [id number] Date of birth: 11/25/1945 Age: 70 y.o. Gender: male  I saw and evaluated the patient. I reviewed the resident's note by Dr. Allena KatzPatel and I agree with the resident's findings and plans as documented in his progress note.  Anthony Avila continues to do well from a pulmonary standpoint on his current antibiotic regimen. The blood culture aerobic bottle that was growing gram-positive cocci turned out to be a coag negative staph. Given it is only in one of 4 bottles we believe it is a contaminant and we will discontinue the vancomycin. The BAL culture continues to grow pseudomonas aeruginosa with sensitivities pending. We will continue with the aztreonam and inhaled tobramycin pending the sensitivities on this organism. His oxygenation remained excellent after dropping the PEEP from 8 to 5 so we will therefore return the FiO2 back to 0.3 which are the settings he was at before he had his fever 3 days ago. Placement continues to be the issue as it has for the last 2 and half weeks.
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
## 2015-10-29 LAB — BASIC METABOLIC PANEL
Anion gap: 11 (ref 5–15)
BUN: 28 mg/dL — ABNORMAL HIGH (ref 6–20)
CALCIUM: 8.9 mg/dL (ref 8.9–10.3)
CHLORIDE: 102 mmol/L (ref 101–111)
CO2: 28 mmol/L (ref 22–32)
Creatinine, Ser: 0.77 mg/dL (ref 0.61–1.24)
Glucose, Bld: 167 mg/dL — ABNORMAL HIGH (ref 65–99)
Potassium: 3.8 mmol/L (ref 3.5–5.1)
SODIUM: 141 mmol/L (ref 135–145)

## 2015-10-29 LAB — GLUCOSE, CAPILLARY
GLUCOSE-CAPILLARY: 102 mg/dL — AB (ref 65–99)
GLUCOSE-CAPILLARY: 168 mg/dL — AB (ref 65–99)
Glucose-Capillary: 137 mg/dL — ABNORMAL HIGH (ref 65–99)
Glucose-Capillary: 131 mg/dL — ABNORMAL HIGH (ref 65–99)
Glucose-Capillary: 104 mg/dL — ABNORMAL HIGH (ref 65–99)
Glucose-Capillary: 155 mg/dL — ABNORMAL HIGH (ref 65–99)

## 2015-10-29 MED ORDER — WHITE PETROLATUM GEL
Status: AC
  Administered 2015-10-29: 0.2
  Filled 2015-10-29: qty 1

## 2015-10-29 MED ORDER — SODIUM CHLORIDE 0.9 % IV SOLN
1.5000 g | Freq: Three times a day (TID) | INTRAVENOUS | Status: DC
  Administered 2015-10-29 – 2015-11-11 (×38): 1.5 g via INTRAVENOUS
  Filled 2015-10-29 (×54): qty 11.4
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
## 2015-10-29 NOTE — Progress Notes (Signed)
Internal Medicine Attending  Date: 10/29/2015  Patient name: Anthony Avila Medical record number: [id number] Date of birth: 10/15/1945 Age: 70 y.o. Gender: male  I saw and evaluated the patient. I reviewed the resident's note by Dr. Allena KatzPatel and I agree with the resident's findings and plans as documented in his progress note.  Clinically [name] is unchanged. His Pseudomonas aeruginosa sensitivities from the bronchial lavage note the same multi resistant organism although the sensitivity to Zerbaxa and Pricilla Holmvycaz are pending. Interestingly, we just received the report that the sensitivity to Zerbaxa from the February 16 tracheal aspirate is now available and he is sensitive to that antibiotic. We are therefore consulting ID asking for approval for Zerbaxa.

## 2015-10-29 NOTE — Progress Notes (Signed)
CSW attempted multiple times this morning to contact son- texted and left voicemails- no answer  CSW faxed referral to [facility] [place]- at this time they do not have beds- also state that pt medication costs (due to O42 meds) are extremely high- pt is out of Medicare days and cost of [facility] would be $25,000/month  CSW attempted to call son to update- unable to reach- CSW texted pt son updates about cost [facility]- no response  CSW contacted [facility] who are still willing to accept patient back- they anticipate having beds available next week if pt is medically stable  CSW will continue to follow  Merlyn LotJenna Holoman, [facility]aleCSWA Clinical Social Worker [phone]

## 2015-10-29 NOTE — Progress Notes (Signed)
Pharmacy Antibiotic Note  Anthony Avila is a 70 y.o. male admitted on 10/05/2015 with MDR pseudomonas HCAP.  Pharmacy has been consulted for Nebulized Tobramycin dosing.  His BMet today reveals a stable Cr and lytes wnl.  Zerbaxa sensitivity has been tested and is sensitive; ID has been consulted.  Plan: Continue Tobramycin 300mg  nebulized bid F/U plans for Zerbaxa  Height: 6\' 2"  (188 cm) Weight: 166 lb (75.297 kg) IBW/kg (Calculated) : 82.2  Temp (24hrs), Avg:98.8 F (37.1 C), Min:97.4 F (36.3 C), Max:99.9 F (37.7 C)   Recent Labs Lab 10/26/15 0520 10/29/15 0500  WBC 8.3  --   CREATININE 0.78 0.77    Estimated Creatinine Clearance: 94.1 mL/min (by C-G formula based on Cr of 0.77).    No Known Allergies  Antimicrobials this admission: Zosyn x 1 2/14 Vanc x 1 2/14, 3/7>> Meropenem 2/15>>2/21 Gentamicin 2/15>>2/28 Azactam 3/6 >> 3/10 Tobra nebs 3/6 >>  Dose adjustments this admission: 2/16 gent random = 4.8 after 560mg  x 1 dose 2/18 gent trough = 2.1, gent peak 6.9 on gent 100 q12h >> changed to [facility] 120 IV q18h for peak ~ 8, trough ~ 1 2/22 Gent trough = 1.9 continue current dose   Microbiology results: 3/6 BCx >> 1 of 2 GPC clusters 3/6 BAL cx >> gram variable rod 2/16 resp >> pseudomonas, s-amg, i-cipro 2/14 urine>> negative 2/14 blood x2>> 1/2 gram + cocci in chains/pairs>>reincubate>>diphtheroids,strep viridans  Thank you for allowing pharmacy to be a part of this patient's care.  [name], PharmD Clinical Pharmacist [facility]

## 2015-10-30 LAB — GLUCOSE, CAPILLARY
GLUCOSE-CAPILLARY: 105 mg/dL — AB (ref 65–99)
GLUCOSE-CAPILLARY: 137 mg/dL — AB (ref 65–99)
GLUCOSE-CAPILLARY: 138 mg/dL — AB (ref 65–99)
Glucose-Capillary: 113 mg/dL — ABNORMAL HIGH (ref 65–99)
Glucose-Capillary: 132 mg/dL — ABNORMAL HIGH (ref 65–99)
Glucose-Capillary: 132 mg/dL — ABNORMAL HIGH (ref 65–99)
Glucose-Capillary: 130 mg/dL — ABNORMAL HIGH (ref 65–99)

## 2015-10-30 LAB — CULTURE, BLOOD (ROUTINE X 2): Culture: NO GROWTH
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
## 2015-10-31 LAB — GLUCOSE, CAPILLARY
GLUCOSE-CAPILLARY: 111 mg/dL — AB (ref 65–99)
GLUCOSE-CAPILLARY: 120 mg/dL — AB (ref 65–99)
GLUCOSE-CAPILLARY: 125 mg/dL — AB (ref 65–99)
GLUCOSE-CAPILLARY: 131 mg/dL — AB (ref 65–99)
GLUCOSE-CAPILLARY: 151 mg/dL — AB (ref 65–99)
Glucose-Capillary: 136 mg/dL — ABNORMAL HIGH (ref 65–99)
Glucose-Capillary: 130 mg/dL — ABNORMAL HIGH (ref 65–99)
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
## 2015-10-31 NOTE — Progress Notes (Signed)
Pt complaining of not wanting to be suctioned. Tried to explain why we needed to suction

## 2015-11-01 DIAGNOSIS — Z1630 Resistance to unspecified antimicrobial drugs: Secondary | ICD-10-CM

## 2015-11-01 DIAGNOSIS — B965 Pseudomonas (aeruginosa) (mallei) (pseudomallei) as the cause of diseases classified elsewhere: Secondary | ICD-10-CM

## 2015-11-01 LAB — CBC
HCT: 23.4 % — ABNORMAL LOW (ref 39.0–52.0)
HEMOGLOBIN: 6.7 g/dL — AB (ref 13.0–17.0)
MCH: 24.9 pg — AB (ref 26.0–34.0)
MCHC: 28.6 g/dL — ABNORMAL LOW (ref 30.0–36.0)
MCV: 87 fL (ref 78.0–100.0)
PLATELETS: 281 10*3/uL (ref 150–400)
RBC: 2.69 MIL/uL — AB (ref 4.22–5.81)
RDW: 17.3 % — ABNORMAL HIGH (ref 11.5–15.5)
WBC: 5.6 10*3/uL (ref 4.0–10.5)

## 2015-11-01 LAB — BASIC METABOLIC PANEL
ANION GAP: 8 (ref 5–15)
BUN: 29 mg/dL — ABNORMAL HIGH (ref 6–20)
CHLORIDE: 105 mmol/L (ref 101–111)
CO2: 30 mmol/L (ref 22–32)
Calcium: 8.9 mg/dL (ref 8.9–10.3)
Creatinine, Ser: 0.7 mg/dL (ref 0.61–1.24)
Glucose, Bld: 143 mg/dL — ABNORMAL HIGH (ref 65–99)
POTASSIUM: 4.6 mmol/L (ref 3.5–5.1)
SODIUM: 143 mmol/L (ref 135–145)

## 2015-11-01 LAB — PREPARE RBC (CROSSMATCH)

## 2015-11-01 LAB — GLUCOSE, CAPILLARY
GLUCOSE-CAPILLARY: 122 mg/dL — AB (ref 65–99)
GLUCOSE-CAPILLARY: 138 mg/dL — AB (ref 65–99)
GLUCOSE-CAPILLARY: 135 mg/dL — AB (ref 65–99)
GLUCOSE-CAPILLARY: 101 mg/dL — AB (ref 65–99)

## 2015-11-01 MED ORDER — SODIUM CHLORIDE 0.9 % IV SOLN
Freq: Once | INTRAVENOUS | Status: AC
  Administered 2015-11-01: 04:00:00 via INTRAVENOUS

## 2015-11-01 NOTE — Progress Notes (Signed)
eLink Physician-Brief Progress Note Patient Name: Anthony Avila DOB: 03/20/1946 MRN: [id number]   Date of Service  11/01/2015  HPI/Events of Note  RT calling eMD  - trach not changed since admit 10/05/2015 and LOS 27 days   - concerned that inhaled tobi is crystallizing in trach  eICU Interventions  Bedside MD to evaluate > 7am        [name] 11/01/2015, 1:01 AM

## 2015-11-01 NOTE — Procedures (Signed)
  Procedure: Trach change  Indication: Routine trach care   Placed in semi-recumbent position. # 7 cuffed trach removed. Stoma was very around the trach tube w/ mild resistance met while removing it. The replacement trach was placed w/out difficulty. Placement checked w/ EZCAP. Pt suctioned. Placed back on vent. Tolerated well.   Erick Colace ACNP-BC [facility] Pager # [phone] OR # [phone] if no answer

## 2015-11-01 NOTE — Progress Notes (Signed)
CSW spoke with MD concerning plan for pt- pt possibly ready this week.  CSW called and left message for [name] and texted (phone went straight to voicemail)  CSW spoke with pt brother who states that [name] just got back in the country and will be [place] [facility] [place]- pt brother maintains that when he has spoken to [name] the plan has been to get patient back home with home health if possible- CSW has been unable to confirm this with patient son  [facility] continues to be able to accept pt back medically as long as bed is available- facility does have several planned DCs for this week so likely to have bed  CSW will continue to follow  Merlyn LotJenna Holoman, [facility] Clinical Social Worker [phone]

## 2015-11-01 NOTE — Plan of Care (Signed)
Problem: ICU Phase Progression Outcomes Goal: O2 sats trending toward baseline Outcome: Progressing Continually suction as needed for excess secretions
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
## 2015-11-01 NOTE — Progress Notes (Signed)
Tried to get in-touch with Son [name] who has signed a previous blood consent on 10/06/15 good for the hospital stay.  Since the patient was confused obtained another consent from the brother Tresa EndoKelly.

## 2015-11-01 NOTE — Progress Notes (Signed)
ANTIBIOTIC CONSULT NOTE - INITIAL  Pharmacy Consult for Tobramycin nebx Indication: MDR PSA HCAP  No Known Allergies  Patient Measurements: Height: 6\' 2"  (188 cm) Weight: 173 lb (78.472 kg) IBW/kg (Calculated) : 82.2  Vital Signs: Temp: 100.4 F (38 C) (03/13 1100) Temp Source: Oral (03/13 1100) BP: 210/82 mmHg (03/13 1100) Pulse Rate: 96 (03/13 1100) Intake/Output from previous day: 03/12 0701 - 03/13 0700 In: 3366.4 [I.V.:297.2; Blood:335; NG/GT:2400; IV Piggyback:334.2] Out: 1400 [Urine:1400] Intake/Output from this shift:    Labs:  Recent Labs  11/01/15 0202  WBC 5.6  HGB 6.7*  PLT 281  CREATININE 0.70   Estimated Creatinine Clearance: 98.1 mL/min (by C-G formula based on Cr of 0.7). No results for input(s): VANCOTROUGH, VANCOPEAK, VANCORANDOM, GENTTROUGH, GENTPEAK, GENTRANDOM, TOBRATROUGH, TOBRAPEAK, TOBRARND, AMIKACINPEAK, AMIKACINTROU, AMIKACIN in the last 72 hours.   Microbiology:   Medical History: Past Medical History  Diagnosis Date  . Diabetes mellitus without complication (HCC)   . HIV disease (HCC)   . Hypertension   . Reflux   . Depressed   . Quadriplegia (HCC)   . Stage IV pressure ulcer (HCC)   . Dysphagia, oropharyngeal phase   . 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 Acute respiratory failure (HCC)   . Quadriplegia (HCC)   . Neuromuscular dysfunction of bladder   . Acute URI   . Anemia   . Toxic encephalopathy   . Pleural effusion   . Gastroparesis   . Hypokalemia   . Major depressive disorder (HCC)   . Anxiety disorder   . PTSD (post-traumatic stress disorder)   . Adjustment disorder with depressed mood   . Insomnia   . Chronic pain   . Pneumonia   . GERD without esophagitis   . Abdominal distention   . Infectious and parasitic disease   . HIV (human immunodeficiency virus infection) (HCC)    Assessment:  ID: s/p 14d gent for MDR Pseudomonas HCAP/UTI > spiked low grade fever on 3/6 > abx restarted with  plan for 14 days.  Afebrile, WBC WNL.  Zerbaxa sensitive per IM note, ID consulted- initiated Zerbaxa (ceftolozane-tazobactam ). Temp 100.4. Renal function and UOP have remained good.  HIV - [place], [place], CD4 540  Merrem 2/15 >> 2/21 Gent 2/15 >> 2/28 Tobra nebs 3/6 (14d, until 3/19) >> Aztreonam 3/6 >>3/10 Vanc 3/7 >> 3/9 Zerbaxa 3/10  (14d)>>  2/16 gent random = 4.8 after 560mg  x 1 dose 2/18 gent trough = 2.1, gent peak 6.9 on gent 100 q12h (changed to 120 IV q18h for peak ~ 8, trough ~ 1) 2/22 gent trough = 1.9 continue current dose  3/6 Blood > CoNS (1 of 2) 3/6 Resp > Pseudomonas, s-amikacin, norflox 2/16 resp >> pseudomonas, s-amg, i-cipro 2/14 urine>> negative 2/14 blood x2>> 1/2 gram + cocci in chains/pairs>>reincubate>>diphtheroids,strep viridans 11/1 urine >> negative 10/26 resp >> pseudomonas, stenotrophomonas (pseudo S - cipro, gent, tob, steno S - levaquin, bactrim) 10/25 resp >> pseudomonas (S-cipro, gent) 10/22 blood x2 >> negative  10/11 resp >> pseudomonas (S-cipro, gent, imi) 10/2 urine >> pseudomonas (S-cipro, gent, imi) 10/2 blood >> 1/2 CoNS  Goal of Therapy:  Eradication of infection  Plan:   - Zerbaxa 1.5g IV q8h dose ok for CrCl>50 - Tobra nebs 300mg  IH BID   Petra Dumler S. Merilynn Finlandobertson, PharmD, BCPS Clinical Staff Pharmacist Pager [phone]  Misty Stanleyobertson, [place] [facility] Stillinger 11/01/2015,11:38 AM

## 2015-11-01 NOTE — Progress Notes (Signed)
Shift Note:  Patient has increased confusion throughout the shift when it comes to care provided by nursing and respiratory staff.  He has continually stated words that aren't appropriate to the current situation.  For example,  "please, remove those boots so I can walk;" or "I wants his arms untie"  As if he can move his extremities.  Have considered his code status and palliative care.

## 2015-11-01 NOTE — Progress Notes (Signed)
Internal Medicine Attending  Date: 11/01/2015  Patient name: Anthony Avila Medical record number: [id number] Date of birth: 05/26/1946 Age: 70 y.o. Gender: male  I saw and evaluated the patient. I reviewed the resident's note by Dr. Allena KatzPatel and I agree with the resident's findings and plans as documented in his progress note.  When seen on rounds this morning [name] was essentially unchanged. He has been on Ceftolozane-tazobactam since March 10. We are also continuing the inhaled tobramycin. If he continues to do well clinically we will repeat a chest x-ray on Wednesday as we may be able to shorten his antibiotic course to 7 days. Reportedly [name] is back in the country and will be visiting on Wednesday. We would like a final decision at that time with regards to either transferring to home or to [facility] as I believe he no longer requires acute hospital inpatient care.

## 2015-11-01 NOTE — Progress Notes (Signed)
CRITICAL VALUE ALERT  Critical value received:  Hgb 6.7  Date of notification:  11/01/15  Time of notification:  0310  Critical value read back: yes  Nurse who received alert:  Foye Deer. Avrohom Mckelvin RN  MD notified (1st page):  Dr. Karma GreaserBoswell (IM)  Time of first page:  0320  MD notified (2nd page):  Time of second page:  Responding MD:  Dr. Karma GreaserBoswell (IM)  Time MD responded: [phone]
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
## 2015-11-01 NOTE — Plan of Care (Signed)
Problem: Pain Managment: Goal: General experience of comfort will improve Outcome: Progressing Patient needing increased pain medications throughout the shifts
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
## 2015-11-02 LAB — TYPE AND SCREEN
ABO/RH(D): A POS
ANTIBODY SCREEN: NEGATIVE
Unit division: 0

## 2015-11-02 LAB — GLUCOSE, CAPILLARY
GLUCOSE-CAPILLARY: 103 mg/dL — AB (ref 65–99)
GLUCOSE-CAPILLARY: 97 mg/dL (ref 65–99)
Glucose-Capillary: 90 mg/dL (ref 65–99)
Glucose-Capillary: 111 mg/dL — ABNORMAL HIGH (ref 65–99)
Glucose-Capillary: 143 mg/dL — ABNORMAL HIGH (ref 65–99)
Glucose-Capillary: 96 mg/dL (ref 65–99)
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
## 2015-11-02 NOTE — Progress Notes (Signed)
Internal Medicine Attending  Date: 11/02/2015  Patient name: Grafton FolkWayne Zurita Medical record number: [id number] Date of birth: 02/04/1946 Age: 70 y.o. Gender: male  I saw and evaluated the patient. I reviewed the resident's note by Dr. Allena KatzPatel and I agree with the resident's findings and plans as documented in his progress note.  [name] remains unchanged clinically from yesterday. He continues to oxygenate well and had a Tmax yesterday of 100.4 on the Zerbaxa and inhaled tobramycin. We will obtain a chest x-ray tomorrow morning. At this point we are waiting disposition to [facility] or to home. If the family is unable to complete arrangements for transfer to home in a timely fashion I would suggest he temporarily return to [facility] awaiting family preparations.

## 2015-11-03 ENCOUNTER — Inpatient Hospital Stay (HOSPITAL_COMMUNITY): Payer: Medicare Other

## 2015-11-03 DIAGNOSIS — Z931 Gastrostomy status: Secondary | ICD-10-CM

## 2015-11-03 DIAGNOSIS — J9601 Acute respiratory failure with hypoxia: Secondary | ICD-10-CM

## 2015-11-03 DIAGNOSIS — L89154 Pressure ulcer of sacral region, stage 4: Secondary | ICD-10-CM | POA: Diagnosis present

## 2015-11-03 LAB — GLUCOSE, CAPILLARY
GLUCOSE-CAPILLARY: 121 mg/dL — AB (ref 65–99)
GLUCOSE-CAPILLARY: 132 mg/dL — AB (ref 65–99)
GLUCOSE-CAPILLARY: 114 mg/dL — AB (ref 65–99)
GLUCOSE-CAPILLARY: 107 mg/dL — AB (ref 65–99)
Glucose-Capillary: 123 mg/dL — ABNORMAL HIGH (ref 65–99)
Glucose-Capillary: 101 mg/dL — ABNORMAL HIGH (ref 65–99)

## 2015-11-03 LAB — CBC
HCT: 26.1 % — ABNORMAL LOW (ref 39.0–52.0)
HEMOGLOBIN: 8.1 g/dL — AB (ref 13.0–17.0)
MCH: 27 pg (ref 26.0–34.0)
MCHC: 31 g/dL (ref 30.0–36.0)
MCV: 87 fL (ref 78.0–100.0)
Platelets: 301 10*3/uL (ref 150–400)
RBC: 3 MIL/uL — AB (ref 4.22–5.81)
RDW: 16.4 % — ABNORMAL HIGH (ref 11.5–15.5)
WBC: 7.7 10*3/uL (ref 4.0–10.5)

## 2015-11-03 LAB — BASIC METABOLIC PANEL
ANION GAP: 9 (ref 5–15)
BUN: 28 mg/dL — ABNORMAL HIGH (ref 6–20)
CHLORIDE: 102 mmol/L (ref 101–111)
CO2: 31 mmol/L (ref 22–32)
Calcium: 9.1 mg/dL (ref 8.9–10.3)
Creatinine, Ser: 0.64 mg/dL (ref 0.61–1.24)
GFR calc non Af Amer: >60 mL/min (ref >60)
Glucose, Bld: 123 mg/dL — ABNORMAL HIGH (ref 65–99)
POTASSIUM: 4 mmol/L (ref 3.5–5.1)
Sodium: 142 mmol/L (ref 135–145)

## 2015-11-03 MED ORDER — LORAZEPAM 2 MG/ML IJ SOLN: 0.5000 mg | Freq: Once | INTRAMUSCULAR | Status: DC

## 2015-11-03 MED ORDER — ALPRAZOLAM 0.25 MG PO TABS
0.2500 mg | ORAL_TABLET | Freq: Once | ORAL | Status: AC
  Administered 2015-11-03: 0.25 mg
  Filled 2015-11-03: qty 1
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
## 2015-11-03 NOTE — Consult Note (Addendum)
WOC wound follow-up consult note Reason for Consult: Bedside nurse requested re-assessment of sacral wound.  Refer to previous consult note on 2/15. Pt has multiple systemic factors which can impair healing.  Sacrum with stage 4 pressure injury; appearance and size unchanged. Wound is beefy red with bone palpable and undermining to wound edges, mod mat yellow drainage, no odor.  It is difficult to keep wound from becoming soiled, since pt is frequently incontinent of stool and rectum is located in close proximity to the wound. Pressure Ulcer POA: Yes Dressing procedure/placement/frequency: Remains on air mattress to reduce pressure and is wearing Prevalon boots to BLE to float heels.Continue present plan of care with moist gauze packing to sacrum to absorb drainage. No family present to discuss plan of care. Please re-consult if further assistance is needed. Thank-you,  Cammie Mcgeeawn Brailyn Killion MSN, RN, CWOCN, [place], CNS [phone]
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
## 2015-11-03 NOTE — Progress Notes (Signed)
Internal Medicine Attending  Date: 11/03/2015  Patient name: Anthony Avila Medical record number: [id number] Date of birth: 04/05/1946 Age: 70 y.o. Gender: male  I saw and evaluated the patient. I reviewed the resident's note by Dr. Allena KatzPatel and I agree with the resident's findings and plans as documented in his progress note.  When seen on rounds this morning Anthony Avila was noted to require an increase in his FiO2 to 0.5 from 0.3. Examination revealed no breath sounds in the left hemithorax. Portable chest x-ray revealed a white out on the left with the left mainstem bronchus cut off likely by a mucous plug. We have asked RT to provide more aggressive suction and chest physiotherapy. We will repeat the chest x-ray and if improved will continue with the suctioning and chest physiotherapy. If not improved he may require repeat bronchoscopy to suction out the left mainstem mucous plug. By getting a better assessment of the underlying left lower lobe infiltrate we will have a better idea of how long he will require therapy with the Zerbaxa. Placement continues to be the main issue long-term.

## 2015-11-04 ENCOUNTER — Inpatient Hospital Stay (HOSPITAL_COMMUNITY): Payer: Medicare Other

## 2015-11-04 DIAGNOSIS — D649 Anemia, unspecified: Secondary | ICD-10-CM | POA: Diagnosis present

## 2015-11-04 DIAGNOSIS — J181 Lobar pneumonia, unspecified organism: Secondary | ICD-10-CM

## 2015-11-04 DIAGNOSIS — J189 Pneumonia, unspecified organism: Secondary | ICD-10-CM | POA: Diagnosis present

## 2015-11-04 DIAGNOSIS — Z9981 Dependence on supplemental oxygen: Secondary | ICD-10-CM

## 2015-11-04 LAB — GLUCOSE, CAPILLARY
GLUCOSE-CAPILLARY: 118 mg/dL — AB (ref 65–99)
GLUCOSE-CAPILLARY: 123 mg/dL — AB (ref 65–99)
GLUCOSE-CAPILLARY: 136 mg/dL — AB (ref 65–99)
GLUCOSE-CAPILLARY: 90 mg/dL (ref 65–99)
Glucose-Capillary: 98 mg/dL (ref 65–99)

## 2015-11-04 MED ORDER — GLYCOPYRROLATE 0.2 MG/ML IJ SOLN
0.2000 mg | INTRAMUSCULAR | Status: DC | PRN
  Filled 2015-11-04: qty 1

## 2015-11-04 MED ORDER — GLYCOPYRROLATE 1 MG PO TABS
1.0000 mg | ORAL_TABLET | ORAL | Status: DC | PRN
  Administered 2015-11-04 – 2015-11-05 (×2): 1 mg via ORAL
  Filled 2015-11-04 (×5): qty 1

## 2015-11-04 NOTE — Progress Notes (Signed)
eLink Physician-Brief Progress Note Patient Name: Anthony Avila DOB: 05/03/1946 MRN: [id number]   Date of Service  11/04/2015  HPI/Events of Note  Near arrest with hypoxemia Instructed respiratory to bag lavage> thick Bey plugs returned  eICU Interventions  Bag lavage trachea q shift Increase PEEP CXR port     Intervention Category Major Interventions: Respiratory failure - evaluation and management  Anthony Avila 11/04/2015, 5:35 PM

## 2015-11-04 NOTE — Progress Notes (Signed)
    [facility]   Reason for visit: Follow up on MDR pneumonia improved after   Interval History: CXR improved, oxygenation improved; sensitivities noted and norfloxacin listed, though not available in the [place]  Meropenem 2/15>>21 Gentamicin 2/15>>2/2 Aztreonam 3/6>>3/10 Vancomycin 3/7>>3/9 Tobramycin nebs 3/6>> Ceftolozane-tazobactam 3/10 >>  Physical Exam: Constitutional:  Filed Vitals:   11/04/15 1135 11/04/15 1215  BP: 219/108 195/85  Pulse: 80 87  Temp:  97.7 F (36.5 C)  Resp: 22 18   patient appears in NAD, itubated Respiratory: Normal respiratory effort; CTA B Cardiovascular: RRR  Review of Systems: Unable to be assessed due to mental status  Lab Results  Component Value Date   WBC 7.7 11/03/2015   HGB 8.1* 11/03/2015   HCT 26.1* 11/03/2015   MCV 87.0 11/03/2015   PLT 301 11/03/2015    Lab Results  Component Value Date   CREATININE 0.64 11/03/2015   BUN 28* 11/03/2015   NA 142 11/03/2015   K 4.0 11/03/2015   CL 102 11/03/2015   CO2 31 11/03/2015    Lab Results  Component Value Date   ALT 20 10/05/2015   AST 13* 10/05/2015   ALKPHOS 59 10/05/2015     Microbiology: Recent Results (from the past 240 hour(s))  Culture, respiratory (NON-Expectorated)     Status: None (Preliminary result)   Collection Time: 10/25/15  4:00 PM  Result Value Ref Range Status   Specimen Description BRONCHIAL ALVEOLAR LAVAGE  Final   Special Requests NONE  Final   Gram Stain   Final    ABUNDANT WBC PRESENT, PREDOMINANTLY PMN FEW SQUAMOUS EPITHELIAL CELLS PRESENT MODERATE GRAM VARIABLE ROD Performed at [facility]    Culture   Final    MODERATE PSEUDOMONAS AERUGINOSA Note: COLISTIN 2ug/mL SENSITIVE ETEST results for this drug are "FOR INVESTIGATIONAL USE ONLY" and should NOT be used for clinical purposes. Performed at [facility]    Report Status PENDING  Incomplete   Organism ID, Bacteria PSEUDOMONAS AERUGINOSA  Final        Susceptibility   Pseudomonas aeruginosa - MIC*    LEVOFLOXACIN >=4 RESISTANT Resistant     AMIKACIN <=16 SENSITIVE Sensitive     AZTREONAM <=16 RESISTANT Resistant     NORFLOXACIN 4 SENSITIVE Sensitive     * MODERATE PSEUDOMONAS AERUGINOSA    Impression/Plan:  1. MDR Pseudomonal pneumonia - improved after suction.   2.  Chronic respiratory failure - on vent, trach

## 2015-11-04 NOTE — Progress Notes (Addendum)
Called to pt's room by RN, on arrival spo2 60%, spo2 continued to decrease to 20%, pt taken off vent bagged & lavaged, suctioned large amount of thick secretions, pt placed back on vent, peep now @12  per MD, fio2 at 100%, spo2 recovered and now 100%, no distress noted at this time, RN at bedside, MD aware, RT will continue to monitor
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
## 2015-11-04 NOTE — Progress Notes (Signed)
CSW continues to follow pt and reach out to son- left message with no response  [facility] continues to follow for potential patient admission but would have to speak with son prior to admission to discuss payment ( currently owes $16,000 and would need to pay $25,000 for the next month upfront)  CSW will continue to follow  Merlyn LotJenna Holoman, [facility] Center[facility]WA Clinical Social Worker [phone]

## 2015-11-04 NOTE — Progress Notes (Signed)
ANTIBIOTIC CONSULT NOTE  Pharmacy Consult for Tobramycin nebx Indication: MDR PSA HCAP  No Known Allergies  Patient Measurements: Height: 6\' 2"  (188 cm) Weight: 165 lb (74.844 kg) IBW/kg (Calculated) : 82.2  Vital Signs: Temp: 98.7 F (37.1 C) (03/16 0800) Temp Source: Oral (03/16 0800) BP: 153/71 mmHg (03/16 0800) Pulse Rate: 69 (03/16 0800) Intake/Output from previous day: 03/15 0701 - 03/16 0700 In: 1979.2 [I.V.:345; NG/GT:1300; IV Piggyback:334.2] Out: 1775 [Urine:1775] Intake/Output from this shift:    Labs:  Recent Labs  11/03/15 0530  WBC 7.7  HGB 8.1*  PLT 301  CREATININE 0.64   Estimated Creatinine Clearance: 93.5 mL/min (by C-G formula based on Cr of 0.64). No results for input(s): VANCOTROUGH, VANCOPEAK, VANCORANDOM, GENTTROUGH, GENTPEAK, GENTRANDOM, TOBRATROUGH, TOBRAPEAK, TOBRARND, AMIKACINPEAK, AMIKACINTROU, AMIKACIN in the last 72 hours.   Microbiology:   Medical History: Past Medical History  Diagnosis Date  . Diabetes mellitus without complication (HCC)   . HIV disease (HCC)   . Hypertension   . Reflux   . Depressed   . Quadriplegia (HCC)   . Stage IV pressure ulcer (HCC)   . Dysphagia, oropharyngeal phase   . 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 Acute respiratory failure (HCC)   . Quadriplegia (HCC)   . Neuromuscular dysfunction of bladder   . Acute URI   . Anemia   . Toxic encephalopathy   . Pleural effusion   . Gastroparesis   . Hypokalemia   . Major depressive disorder (HCC)   . Anxiety disorder   . PTSD (post-traumatic stress disorder)   . Adjustment disorder with depressed mood   . Insomnia   . Chronic pain   . Pneumonia   . GERD without esophagitis   . Abdominal distention   . Infectious and parasitic disease   . HIV (human immunodeficiency virus infection) (HCC)    Assessment:  ID: s/p 14d gent for MDR Pseudomonas HCAP/UTI > spiked low grade fever on 3/6 > abx restarted with plan for 14 days.   Afebrile, WBC WNL.   ID consulted- initiated Zerbaxa. Pt is afebrile, renal function and UOP have remained good.  HIV - [place], [place]cov[place], CD4 540  Merrem 2/15 >> 2/21 Gent 2/15 >> 2/28 Tobra nebs 3/6 (14d, until 3/19) >> Aztreonam 3/6 >>3/10 Vanc 3/7 >> 3/9 Zerbaxa 3/10  (14d)>>  2/16 gent random = 4.8 after 560mg  x 1 dose 2/18 gent trough = 2.1, gent peak 6.9 on gent 100 q12h (changed to 120 IV q18h for peak ~ 8, trough ~ 1) 2/22 gent trough = 1.9 continue current dose  3/6 Blood > CoNS (1 of 2) 3/6 Resp > Pseudomonas, s-amikacin, norflox 2/16 resp >> pseudomonas, s-amg, i-cipro 2/14 urine>> negative 2/14 blood x2>> 1/2 gram + cocci in chains/pairs>>reincubate>>diphtheroids,strep viridans 11/1 urine >> negative 10/26 resp >> pseudomonas, stenotrophomonas (pseudo S - cipro, gent, tob, steno S - levaquin, bactrim) 10/25 resp >> pseudomonas (S-cipro, gent) 10/22 blood x2 >> negative  10/11 resp >> pseudomonas (S-cipro, gent, imi) 10/2 urine >> pseudomonas (S-cipro, gent, imi) 10/2 blood >> 1/2 CoNS  Goal of Therapy:  Eradication of infection  Plan:  - Zerbaxa 1.5g IV q8h dose ok for CrCl>50 - Tobra nebs 300mg  IH BID - F/u duration of therapy   [employer], Anthony Avila 11/04/2015,10:34 AM
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
## 2015-11-04 NOTE — Progress Notes (Signed)
Patient ID: [name], male   DOB: 04/25/1946, 70 y.o.   MRN: [id number] Medicine attending: I personally interviewed and examined this patient today together with resident physician Dr. Merrie RoofBuechel Patel and I concur with his evaluation and management plan which we discussed together. With aggressive suctioning, there has been improvement with partial reexpansion of the left lung. He remains afebrile on Zerbaxa for resistant Pseudomonas pneumonia. He remains ventilator dependent. Fixed neurologic deficits from spinal cord compression. Blood sugars in good range. He continues on antiretrovirals for HIV infection. Ongoing follow-up from infectious disease with respect to his resistant Pseudomonas infection and HIV. 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 As condition stabilizes, long-term planning again moves to the forefront. It is really not practical to think that he can be managed on a home ventilator. It is more realistic for him to go to a long-term care facility. He was previously hospitalized here at the [facility] and we will ask them to reevaluate him for his long-term care needs.

## 2015-11-04 NOTE — Progress Notes (Signed)
Pt started desating to low70's-60's gave 100% O2,HR dropped to 40's called RT , Md was paged,E.Link on, RT's at bedside,sat's continued to dropped to 20 % pt's brother was informed.sat's went back up after bagged and  lavage and got a lot of thick secretions.Will continue to monitor.

## 2015-11-05 DIAGNOSIS — J9601 Acute respiratory failure with hypoxia: Secondary | ICD-10-CM | POA: Diagnosis present

## 2015-11-05 LAB — GLUCOSE, CAPILLARY
GLUCOSE-CAPILLARY: 109 mg/dL — AB (ref 65–99)
GLUCOSE-CAPILLARY: 113 mg/dL — AB (ref 65–99)
GLUCOSE-CAPILLARY: 114 mg/dL — AB (ref 65–99)
GLUCOSE-CAPILLARY: 114 mg/dL — AB (ref 65–99)
GLUCOSE-CAPILLARY: 117 mg/dL — AB (ref 65–99)
Glucose-Capillary: 144 mg/dL — ABNORMAL HIGH (ref 65–99)
Glucose-Capillary: 113 mg/dL — ABNORMAL HIGH (ref 65–99)

## 2015-11-05 LAB — BASIC METABOLIC PANEL
ANION GAP: 9 (ref 5–15)
BUN: 27 mg/dL — ABNORMAL HIGH (ref 6–20)
CALCIUM: 9.2 mg/dL (ref 8.9–10.3)
CHLORIDE: 102 mmol/L (ref 101–111)
CO2: 31 mmol/L (ref 22–32)
CREATININE: 0.64 mg/dL (ref 0.61–1.24)
GFR calc non Af Amer: >60 mL/min (ref >60)
Glucose, Bld: 118 mg/dL — ABNORMAL HIGH (ref 65–99)
Potassium: 3.7 mmol/L (ref 3.5–5.1)
SODIUM: 142 mmol/L (ref 135–145)

## 2015-11-05 LAB — CULTURE, RESPIRATORY

## 2015-11-05 LAB — CULTURE, RESPIRATORY W GRAM STAIN

## 2015-11-05 LAB — MISCELLANEOUS TEST

## 2015-11-05 MED ORDER — ACETYLCYSTEINE 20 % IN SOLN
2.0000 mL | Freq: Two times a day (BID) | RESPIRATORY_TRACT | Status: AC
  Administered 2015-11-05 – 2015-11-07 (×6): 2 mL via RESPIRATORY_TRACT
  Filled 2015-11-05 (×7): qty 4

## 2015-11-05 MED ORDER — ACETYLCYSTEINE 10 % IN SOLN: 4.0000 mL | Freq: Two times a day (BID) | RESPIRATORY_TRACT | Status: DC

## 2015-11-05 NOTE — Progress Notes (Signed)
    [facility]   Reason for visit: Follow up on MDR pneumonia   Interval History: another desat episode last night due to mucus plugging  Meropenem 2/15>>21 Gentamicin 2/15>>2/2 Aztreonam 3/6>>3/10 Vancomycin 3/7>>3/9 Tobramycin nebs 3/6>> Ceftolozane-tazobactam 3/10 >>  Physical Exam: Constitutional:  Filed Vitals:   11/05/15 1100 11/05/15 1300  BP: 171/78 190/87  Pulse: 69 76  Temp:    Resp: 18 16   patient appears in NAD, itubated Respiratory: Normal respiratory effort; CTA B Cardiovascular: RRR  Review of Systems: Unable to be assessed due to mental status  Lab Results  Component Value Date   WBC 7.7 11/03/2015   HGB 8.1* 11/03/2015   HCT 26.1* 11/03/2015   MCV 87.0 11/03/2015   PLT 301 11/03/2015    Lab Results  Component Value Date   CREATININE 0.64 11/05/2015   BUN 27* 11/05/2015   NA 142 11/05/2015   K 3.7 11/05/2015   CL 102 11/05/2015   CO2 31 11/05/2015    Lab Results  Component Value Date   ALT 20 10/05/2015   AST 13* 10/05/2015   ALKPHOS 59 10/05/2015     Microbiology: No results found for this or any previous visit (from the past 240 hour(s)).  Impression/Plan:  1. MDR Pseudomonal pneumonia - improved.  Zyrbaxa through 3/23 and stop.  2.  Chronic respiratory failure - on vent, trach.  Continued plugging.  Not safe at home due to that.  CCM adding mucomyst.   I will sign off, please call with questions.

## 2015-11-05 NOTE — Progress Notes (Signed)
Orthopedic Tech Progress Note Patient Details:  Anthony Avila 03/25/1946 [id number]  Patient ID: Anthony Avila, male   DOB: 02/14/1946, 70 y.o.   MRN: [id number]   [name] 11/05/2015, 3:23 PMBilateral PRAFO'S.

## 2015-11-05 NOTE — Progress Notes (Signed)
Orthopedic Tech Progress Note Patient Details:  Anthony Avila 04/30/1946 [id number]  Ortho Devices Type of Ortho Device: Postop shoe/boot Ortho Device/Splint Location: Bilateral heel boots Ortho Device/Splint Interventions: Application   Saul FordyceJennifer C [name] 11/05/2015, 3:46 PM
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
## 2015-11-05 NOTE — Progress Notes (Addendum)
CSW faxed clinical info to [facility] [phone]) and [facility] [phone]).  Osborne Cascoadia Metha Kolasa LCSWA [phone]

## 2015-11-05 NOTE — Progress Notes (Signed)
Patient ID: Anthony Avila, male   DOB: 03/10/1946, 70 y.o.   MRN: [id number] Medicine attending: I examined this patient this morning together with resident physician Dr. Darreld McleanVishal Patel and concur with his evaluation and management plan which we discussed together.  We greatly appreciate assistance from pulmonary critical care and respiratory therapy and infectious disease. The patient again developed mucous plugging with acute hypoxic respiratory failure last evening. Fortunately, with aggressive local care he is now back to his baseline and chest radiograph done post resuscitation remains stable. Oxygen saturation 100% on 50% FiO2. He remains afebrile on Zerbaxa for resistant Pseudomonas pneumonia. Patient is obviously depressed over his situation. The only reason that he has been able to survive these repetitive hypoxic insults is that he has been under close observation in the hospital. I don't see any alternative to management in a long-term care facility with trained staff to assist him when he gets in trouble. I do not feel he could be managed adequately in the home setting. We will continue the current management plan.
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
## 2015-11-05 NOTE — Progress Notes (Signed)
Bag lavage performed at this time per MD order.  Pt tolerated well.

## 2015-11-05 NOTE — Care Management Note (Signed)
Case Management Note  Patient Details  Name: Anthony Avila MRN: [id number] Date of Birth: 04/17/1946  Subjective/Objective:     Spoke with son, [name], about events of the last week and judgement by physicians that pt will need facility placement because he periodically needs aggressive treatment.  [name] agrees that facility placement is the best option for pt at this point.  Requests CSW [place].  If no bed availability, next choice [place] [facility].  CSW notified.                         Expected Discharge Plan:  Skilled Nursing Facility  In-House Referral:  Clinical Social Work  Discharge planning Services  CM Consult  Status of Service:  In process, will continue to follow  Magdalene RiverMayo, Carlicia Leavens T, RN 11/05/2015, 2:32 PM

## 2015-11-06 LAB — GLUCOSE, CAPILLARY
GLUCOSE-CAPILLARY: 101 mg/dL — AB (ref 65–99)
GLUCOSE-CAPILLARY: 137 mg/dL — AB (ref 65–99)
Glucose-Capillary: 120 mg/dL — ABNORMAL HIGH (ref 65–99)
Glucose-Capillary: 139 mg/dL — ABNORMAL HIGH (ref 65–99)
Glucose-Capillary: 148 mg/dL — ABNORMAL HIGH (ref 65–99)
Glucose-Capillary: 89 mg/dL (ref 65–99)

## 2015-11-06 MED ORDER — HYDRALAZINE HCL 20 MG/ML IJ SOLN
10.0000 mg | Freq: Three times a day (TID) | INTRAMUSCULAR | Status: DC | PRN
Start: 2015-11-06 — End: 2015-11-25
  Administered 2015-11-06: 0.5 mg via INTRAVENOUS
  Administered 2015-11-08 – 2015-11-13 (×4): 10 mg via INTRAVENOUS
  Filled 2015-11-06 (×6): qty 1

## 2015-11-06 MED ORDER — HYDRALAZINE HCL 20 MG/ML IJ SOLN: 10.0000 mg | Freq: Three times a day (TID) | INTRAMUSCULAR | Status: DC | PRN

## 2015-11-06 NOTE — Progress Notes (Signed)
Bagged ;and lavaged pt
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
## 2015-11-06 NOTE — Progress Notes (Signed)
11/06/2015 Notified internal medicine teaching service about patient systolic blood pressure in the 170's to 190's. Order was put in place for hydralazine 10mg  prn. [facility] Pershing Skidmore RN.

## 2015-11-07 LAB — GLUCOSE, CAPILLARY
GLUCOSE-CAPILLARY: 127 mg/dL — AB (ref 65–99)
Glucose-Capillary: 118 mg/dL — ABNORMAL HIGH (ref 65–99)
Glucose-Capillary: 102 mg/dL — ABNORMAL HIGH (ref 65–99)
Glucose-Capillary: 125 mg/dL — ABNORMAL HIGH (ref 65–99)
Glucose-Capillary: 105 mg/dL — ABNORMAL HIGH (ref 65–99)
Glucose-Capillary: 114 mg/dL — ABNORMAL HIGH (ref 65–99)

## 2015-11-07 NOTE — Plan of Care (Signed)
Problem: Pain Managment: Goal: General experience of comfort will improve Outcome: Progressing Pain adequately controlled with prescribed pain medication

## 2015-11-07 NOTE — Progress Notes (Signed)
Bag and lavaged patient for suctioning with copious amounts of thick white and yellow secretions

## 2015-11-07 NOTE — Progress Notes (Signed)
Patient ID: Anthony Avila, male   DOB: 02/14/1946, 70 y.o.   MRN: [id number] Medicine attending: I personally supervised examination of this patient this morning with resident physician Dr. Darreld McleanVishal Patel and I concur with his evaluation and management plan which we discussed together. Blood pressure elevation coincident with a trial of robinul and attempt to dry secretions having failed scopolamine. We discontinued this medication. As needed hydralazine administered. A trial of an acetylcysteine added by pulmonary consult to see if we can mobilize secretions. He continues on Zerbaxa for resistant Pseudomonas pneumonia. Antiretroviral or sore his underlying HIV. Permanent trach for chronic respiratory failure associated with paraplegia. Gastrostomy tube for nutrition.
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
## 2015-11-08 LAB — BASIC METABOLIC PANEL
ANION GAP: 7 (ref 5–15)
BUN: 23 mg/dL — ABNORMAL HIGH (ref 6–20)
CHLORIDE: 104 mmol/L (ref 101–111)
CO2: 31 mmol/L (ref 22–32)
Calcium: 8.9 mg/dL (ref 8.9–10.3)
Creatinine, Ser: 0.71 mg/dL (ref 0.61–1.24)
GFR calc non Af Amer: >60 mL/min (ref >60)
GLUCOSE: 144 mg/dL — AB (ref 65–99)
POTASSIUM: 3.7 mmol/L (ref 3.5–5.1)
Sodium: 142 mmol/L (ref 135–145)

## 2015-11-08 LAB — GLUCOSE, CAPILLARY
GLUCOSE-CAPILLARY: 113 mg/dL — AB (ref 65–99)
GLUCOSE-CAPILLARY: 128 mg/dL — AB (ref 65–99)
GLUCOSE-CAPILLARY: 134 mg/dL — AB (ref 65–99)
GLUCOSE-CAPILLARY: 147 mg/dL — AB (ref 65–99)
Glucose-Capillary: 114 mg/dL — ABNORMAL HIGH (ref 65–99)
Glucose-Capillary: 121 mg/dL — ABNORMAL HIGH (ref 65–99)
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
## 2015-11-08 NOTE — Progress Notes (Signed)
CSW spoke with pt son, [name], and informed that MD is saying pt stable for DC.  CSW informed [name] of current bed availability   [facility], [place], [place]-- [phone]- no bed currently- possible for this week  [facility], [place], [place] [phone]- [phone]- no bed- waiting list  [facility], [place], [place]-- [phone]- no bed- waiting list (estimates several weeks)  [facility], [place], [place]- [phone]- [facility]- reviewing updated clinicals  CSW reiterated that son would need to pay $25,000 upfront since pt has no more Medicare days- son will be calling lawyer and get back with CSW today  Merlyn LotJenna Holoman, [facility] Clinical Social Worker [phone]

## 2015-11-08 NOTE — Progress Notes (Signed)
Patient ID: Anthony Avila, male   DOB: 11/27/1945, 70 y.o.   MRN: [id number] Medicine attending: Clinical status and management plan reviewed at the patient's bedside with Dr. Darreld McleanVishal Patel and I concur with his evaluation and plan. No acute change in patient's status. Blood pressure better controlled. He remains afebrile and continues a course of Zerbaxa and nebulized tobramycin for resistant Pseudomonas pneumonia. Renal function remained stable. Ongoing efforts to have him placed in a suitable facility that accepts ventilator patients.

## 2015-11-09 LAB — GLUCOSE, CAPILLARY
GLUCOSE-CAPILLARY: 117 mg/dL — AB (ref 65–99)
GLUCOSE-CAPILLARY: 116 mg/dL — AB (ref 65–99)
Glucose-Capillary: 109 mg/dL — ABNORMAL HIGH (ref 65–99)
Glucose-Capillary: 99 mg/dL (ref 65–99)
Glucose-Capillary: 131 mg/dL — ABNORMAL HIGH (ref 65–99)
Glucose-Capillary: 95 mg/dL (ref 65–99)
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
## 2015-11-09 NOTE — Progress Notes (Signed)
Patient ID: Anthony Avila, male   DOB: 08/21/1945, 70 y.o.   MRN: [id number] Medicine attending: I examined this patient this morning with resident physician Dr. Darreld McleanVishal Patel and I concur with his evaluation and management plan which we discussed together. No acute change. He continues on antibiotics for resistant Pseudomonas infection. Transferred to ventilator capable facility when bed available.

## 2015-11-09 NOTE — Progress Notes (Signed)
CSW [facility] with [facility] in [facility]- they have denied pt referral.  CSW expanded bed search for [facility].  [facility] still willing to accept pt back if son pays current bill   [facility], [place], [place]-- [phone]- no beds available  [facility], [place], [place] [phone]- spoke with yesterday- no availability  [address], [place], [place]- [phone]- spoke with yesterday- no availability  [facility], [place], [place]- [phone]- referral sent- facility unwilling to accept with pt expensive medications  [facility], [place], [place]- [phone]- sent referral 3/21  [facility], [place], [place]- [phone]- sent referral 3/21  [facility], [place], [place]- [phone]- sent referral 3/21
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
## 2015-11-09 NOTE — Progress Notes (Signed)
RT was asked to come to room 2C09 because of O2 saturation in the 50s. Upon arrival patient is alert on the vent. RT bagged and lavaged patient x 3 and obtained a small amount of very thick secretions. Patient O2 saturation slowly began to rise and patient is doing well at this time. RT will continue to monitor.
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
## 2015-11-10 LAB — GLUCOSE, CAPILLARY
GLUCOSE-CAPILLARY: 139 mg/dL — AB (ref 65–99)
GLUCOSE-CAPILLARY: 144 mg/dL — AB (ref 65–99)
GLUCOSE-CAPILLARY: 114 mg/dL — AB (ref 65–99)
Glucose-Capillary: 127 mg/dL — ABNORMAL HIGH (ref 65–99)
Glucose-Capillary: 147 mg/dL — ABNORMAL HIGH (ref 65–99)
Glucose-Capillary: 148 mg/dL — ABNORMAL HIGH (ref 65–99)
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
## 2015-11-10 NOTE — Progress Notes (Signed)
Patient ID: [name], male   DOB: 01/29/1946, 70 y.o.   MRN: [id number] Medicine attending: Status reviewed at the bedside with resident physician Dr. Darreld McleanVishal Patel and I concur with his evaluation and management plan. The patient had another episode of transient hypoxia associated with mucous plugging. This has been a recurrent insoluble problem. Care management team experiencing difficulty trying to place this patient. He should finish planned course of antibiotics this Friday. This might make it a little bit easier to find a facility that will accept him. Financial issues are another obstacle.
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
## 2015-11-11 ENCOUNTER — Inpatient Hospital Stay (HOSPITAL_COMMUNITY): Payer: Medicare Other

## 2015-11-11 LAB — GLUCOSE, CAPILLARY
GLUCOSE-CAPILLARY: 147 mg/dL — AB (ref 65–99)
GLUCOSE-CAPILLARY: 107 mg/dL — AB (ref 65–99)
Glucose-Capillary: 128 mg/dL — ABNORMAL HIGH (ref 65–99)
Glucose-Capillary: 136 mg/dL — ABNORMAL HIGH (ref 65–99)
Glucose-Capillary: 142 mg/dL — ABNORMAL HIGH (ref 65–99)
Glucose-Capillary: 90 mg/dL (ref 65–99)

## 2015-11-11 LAB — BASIC METABOLIC PANEL
Anion gap: 8 (ref 5–15)
BUN: 18 mg/dL (ref 6–20)
CHLORIDE: 102 mmol/L (ref 101–111)
CO2: 30 mmol/L (ref 22–32)
CREATININE: 0.62 mg/dL (ref 0.61–1.24)
Calcium: 8.9 mg/dL (ref 8.9–10.3)
GFR calc non Af Amer: >60 mL/min (ref >60)
GLUCOSE: 139 mg/dL — AB (ref 65–99)
Potassium: 3.7 mmol/L (ref 3.5–5.1)
Sodium: 140 mmol/L (ref 135–145)

## 2015-11-11 MED ORDER — SODIUM CHLORIDE 0.9 % IV SOLN
1.5000 g | Freq: Three times a day (TID) | INTRAVENOUS | Status: AC
  Administered 2015-11-11 – 2015-11-12 (×3): 1.5 g via INTRAVENOUS
  Filled 2015-11-11 (×4): qty 11.4
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
## 2015-11-11 NOTE — Progress Notes (Signed)
[facility]- [facility] are [facility]- estimate that private pay will be $17,000/month  CSW attempted to contact pt son to update- sent update in text message.  CSW will continue to follow and continues to look into other facility options- [facility] continues not to have bed and no other facility have accept pt based on medical issues/ expensive medications at this time  Merlyn LotJenna Holoman, [facility] Clinical Social Worker [phone]

## 2015-11-11 NOTE — Progress Notes (Signed)
RT bag/lavaged pt due to decreased O2 sats. RT was able to obtain a moderate amount of thick secretions. RT will continue to monitor.

## 2015-11-11 NOTE — Progress Notes (Signed)
Patient ID: [name], male   DOB: 04/18/1946, 70 y.o.   MRN: [id number] Medicine attending: I examined this patient together with resident physician Dr. Darreld McleanVishal Patel and I concur with his evaluation and management plan which we discussed together. No new acute changes in the patient's status. He will complete a planned course of Zerbaxa tomorrow. Overall afebrile. Single temp 99. 3 recorded. Systolic blood pressure still running high. Blood sugars in good range. Arrangements still in progress re placement in a long-term facility that accepts ventilator patients.

## 2015-11-12 DIAGNOSIS — L89154 Pressure ulcer of sacral region, stage 4: Secondary | ICD-10-CM

## 2015-11-12 LAB — GLUCOSE, CAPILLARY
GLUCOSE-CAPILLARY: 131 mg/dL — AB (ref 65–99)
Glucose-Capillary: 108 mg/dL — ABNORMAL HIGH (ref 65–99)
Glucose-Capillary: 114 mg/dL — ABNORMAL HIGH (ref 65–99)
Glucose-Capillary: 114 mg/dL — ABNORMAL HIGH (ref 65–99)
Glucose-Capillary: 150 mg/dL — ABNORMAL HIGH (ref 65–99)
Glucose-Capillary: 151 mg/dL — ABNORMAL HIGH (ref 65–99)

## 2015-11-12 MED ORDER — SENNOSIDES-DOCUSATE SODIUM 8.6-50 MG PO TABS
1.0000 | ORAL_TABLET | Freq: Every evening | ORAL | Status: DC | PRN
  Administered 2015-11-14: 1
  Filled 2015-11-12: qty 1

## 2015-11-12 MED ORDER — POLYETHYLENE GLYCOL 3350 17 G PO PACK: 17.0000 g | PACK | Freq: Every day | ORAL | Status: DC | PRN

## 2015-11-12 MED ORDER — AMLODIPINE BESYLATE 5 MG PO TABS
5.0000 mg | ORAL_TABLET | Freq: Every day | ORAL | Status: DC
  Administered 2015-11-12 – 2015-11-25 (×14): 5 mg
  Filled 2015-11-12 (×14): qty 1

## 2015-11-12 NOTE — Progress Notes (Signed)
CSW attempted to call pt son multiple times today- left messages  CSW [place] additional [place] [facility] [place] [facility] [place] and informed that pt will no longer be on Zerbaxa  CSW spoke with pt brother who states he believes [place]- states Anthony Avila is very important to [employer]  Brother acknowledges that this is not an ideal situation and states that he has talked to Anthony Avila before about allowing pt brother to be more involved in decision making since he is more local and can be reached regularly- Anthony GrovesKelly Avila ensured CSW that he would work on contacting the son and convince him of need to allow Anthony EndoKelly to help in patient placement needs  CSW will continue to follow  Anthony LotJenna Avila, [facility] Clinical Social Worker [phone]
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
## 2015-11-12 NOTE — Progress Notes (Signed)
Called to room by RT to suction pt.  Suctioned pt initially- pt desaturated to 68%. Immed placed pt on 100% fio2 and ambu bagged/lavaged pt.  Suctioned for plugs/thick secretions x 3.  Pt placed back on vent, sat now 98-100% on vent.  No distress noted, pt states his breathing feels "better" now.  BBSH coarse clear diminished.  Unit RT aware.
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
## 2015-11-13 DIAGNOSIS — I10 Essential (primary) hypertension: Secondary | ICD-10-CM

## 2015-11-13 DIAGNOSIS — Z960 Presence of urogenital implants: Secondary | ICD-10-CM

## 2015-11-13 DIAGNOSIS — Z95828 Presence of other vascular implants and grafts: Secondary | ICD-10-CM

## 2015-11-13 DIAGNOSIS — G8220 Paraplegia, unspecified: Secondary | ICD-10-CM

## 2015-11-13 DIAGNOSIS — J95851 Ventilator associated pneumonia: Secondary | ICD-10-CM

## 2015-11-13 LAB — GLUCOSE, CAPILLARY
GLUCOSE-CAPILLARY: 110 mg/dL — AB (ref 65–99)
GLUCOSE-CAPILLARY: 113 mg/dL — AB (ref 65–99)
GLUCOSE-CAPILLARY: 117 mg/dL — AB (ref 65–99)
GLUCOSE-CAPILLARY: 130 mg/dL — AB (ref 65–99)
Glucose-Capillary: 108 mg/dL — ABNORMAL HIGH (ref 65–99)
Glucose-Capillary: 133 mg/dL — ABNORMAL HIGH (ref 65–99)

## 2015-11-13 LAB — MRSA PCR SCREENING: MRSA BY PCR: NEGATIVE

## 2015-11-13 NOTE — Progress Notes (Signed)
Internal Medicine Attending:   I saw and examined the patient. I reviewed the resident's note and I agree with the resident's findings and plan as documented in the resident's note.  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 Patient appears to be doing well today, tolerating current vent settings without hypoxia. Still has sputum production throughout the day, but no mucus plugging last night. He received nutrition through G-tube. He has a Foley catheter in place. He has a left-sided double-lumen PICC.

## 2015-11-13 NOTE — Progress Notes (Signed)
Bagged & lavaged pt with 510ml's of normal saline. Small amount of tan secretions. Pt remained stable throughout. Sp02 remained 100%. No complications noted.
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
## 2015-11-14 LAB — GLUCOSE, CAPILLARY
GLUCOSE-CAPILLARY: 141 mg/dL — AB (ref 65–99)
GLUCOSE-CAPILLARY: 101 mg/dL — AB (ref 65–99)
GLUCOSE-CAPILLARY: 108 mg/dL — AB (ref 65–99)
Glucose-Capillary: 107 mg/dL — ABNORMAL HIGH (ref 65–99)
Glucose-Capillary: 86 mg/dL (ref 65–99)
Glucose-Capillary: 95 mg/dL (ref 65–99)
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
## 2015-11-15 LAB — GLUCOSE, CAPILLARY
GLUCOSE-CAPILLARY: 126 mg/dL — AB (ref 65–99)
Glucose-Capillary: 130 mg/dL — ABNORMAL HIGH (ref 65–99)
Glucose-Capillary: 97 mg/dL (ref 65–99)
Glucose-Capillary: 112 mg/dL — ABNORMAL HIGH (ref 65–99)
Glucose-Capillary: 125 mg/dL — ABNORMAL HIGH (ref 65–99)
Glucose-Capillary: 114 mg/dL — ABNORMAL HIGH (ref 65–99)

## 2015-11-15 LAB — BASIC METABOLIC PANEL
Anion gap: 8 (ref 5–15)
BUN: 23 mg/dL — AB (ref 6–20)
CALCIUM: 9.2 mg/dL (ref 8.9–10.3)
CO2: 30 mmol/L (ref 22–32)
CREATININE: 0.7 mg/dL (ref 0.61–1.24)
Chloride: 103 mmol/L (ref 101–111)
GFR calc Af Amer: >60 mL/min (ref >60)
GLUCOSE: 131 mg/dL — AB (ref 65–99)
POTASSIUM: 3.7 mmol/L (ref 3.5–5.1)
SODIUM: 141 mmol/L (ref 135–145)

## 2015-11-15 NOTE — Progress Notes (Signed)
Bagged & lavaged pt at this time.  Suctioned back large amounts of thick secetions.  Pt tolerated well, RT will monitor.

## 2015-11-15 NOTE — Progress Notes (Signed)
[facility] [place] is able to extend a bed offer based on medical needs but has been trying to contact the son without success  CSW will continue to follow  Merlyn LotJenna Holoman, [facility] Cancer CenterCSWA Clinical Social Worker [phone]

## 2015-11-15 NOTE — Progress Notes (Signed)
PT Cancellation Note/D/C Note  Patient Details Name: Anthony Avila MRN: [id number] DOB: 01/20/1946   Cancelled Treatment:    Reason Eval/Treat Not Completed: PT screened, no needs identified, will sign off (See note 11/05/15 regarding pts prior mobility.  Use lift to get OOB.)   Berline LopesWhite, Zachory Mangual F 11/15/2015, 9:23 AM Eber Jonesawn Karliah Kowalchuk,PT Acute Rehabilitation [phone] (pager)
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
## 2015-11-15 NOTE — Progress Notes (Signed)
Patient ID: [name], male   DOB: 07/01/1946, 70 y.o.   MRN: [id number] Medicine attending: I examined this patient this morning together with resident physician Dr. Darreld McleanVishal Patel and I concur with his evaluation and management plan which we discussed together. No acute change in the patient's condition. He has completed a planned course of antibiotics for ventilator associated, resistant Pseudomonas infection. 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 He remains ventilator dependent with intermittent mucus plugging causing hypoxic episodes. He has been in the hospital for over one month. He is depressed. He was venting a lot of this morning on rounds. He has some intermittent hallucinosis. Ongoing efforts by our care management team to find a suitable long-term facility for him. In the interim, we will get physical therapy involved again.

## 2015-11-16 DIAGNOSIS — Z8619 Personal history of other infectious and parasitic diseases: Secondary | ICD-10-CM | POA: Diagnosis not present

## 2015-11-16 LAB — GLUCOSE, CAPILLARY
GLUCOSE-CAPILLARY: 140 mg/dL — AB (ref 65–99)
GLUCOSE-CAPILLARY: 116 mg/dL — AB (ref 65–99)
Glucose-Capillary: 125 mg/dL — ABNORMAL HIGH (ref 65–99)
Glucose-Capillary: 121 mg/dL — ABNORMAL HIGH (ref 65–99)
Glucose-Capillary: 129 mg/dL — ABNORMAL HIGH (ref 65–99)
Glucose-Capillary: 111 mg/dL — ABNORMAL HIGH (ref 65–99)

## 2015-11-16 MED ORDER — OXYCODONE-ACETAMINOPHEN 5-325 MG PO TABS: 1.0000 | ORAL_TABLET | Freq: Four times a day (QID) | ORAL | Status: DC | PRN

## 2015-11-16 MED ORDER — INSULIN GLARGINE 100 UNIT/ML ~~LOC~~ SOLN: 23.0000 [IU] | Freq: Every day | SUBCUTANEOUS | Status: AC

## 2015-11-16 MED ORDER — ALTEPLASE 2 MG IJ SOLR
2.0000 mg | Freq: Once | INTRAMUSCULAR | Status: AC
  Administered 2015-11-16: 2 mg
  Filled 2015-11-16 (×2): qty 2

## 2015-11-16 MED ORDER — VITAL AF 1.2 CAL PO LIQD: 1000.0000 mL | ORAL | Status: AC

## 2015-11-16 MED ORDER — ALBUTEROL SULFATE (2.5 MG/3ML) 0.083% IN NEBU: 2.5000 mg | INHALATION_SOLUTION | Freq: Four times a day (QID) | RESPIRATORY_TRACT | Status: AC

## 2015-11-16 MED ORDER — AMLODIPINE BESYLATE 5 MG PO TABS
5.0000 mg | ORAL_TABLET | Freq: Every day | ORAL | Status: AC
Start: 2015-11-16 — End: ?

## 2015-11-16 MED ORDER — OXYCODONE-ACETAMINOPHEN 5-325 MG PO TABS
1.0000 | ORAL_TABLET | Freq: Once | ORAL | Status: AC
  Administered 2015-11-16: 1 via ORAL
  Filled 2015-11-16: qty 1

## 2015-11-16 MED ORDER — BISACODYL 10 MG RE SUPP: 10.0000 mg | Freq: Every day | RECTAL | Status: AC

## 2015-11-16 NOTE — Progress Notes (Signed)
Bagged & lavaged pt at this time.  Pt tolerated well, RT will continue to monitor.

## 2015-11-16 NOTE — Progress Notes (Signed)
Red lumen on left PICC occluded. Order placed for IV Team to unclog with cathflow. Will continue to monitor.
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
## 2015-11-16 NOTE — Progress Notes (Signed)
Pt stating he feels like he "cannot breathe and throat is closing." Suctioning not effective to relieve symptoms.  Respiratory notified and performed saline lavage with suctioning while bagging. Notified Dr patel of pts increased anxiety with HR of 110 and RR 31. Instructed to give PRN percocet earlier at 1600 and monitor. Will continue to monitor pt and give scheduled meds.

## 2015-11-16 NOTE — Progress Notes (Signed)
Patient ID: [name], male   DOB: 01/07/1946, 70 y.o.   MRN: [id number] Medicine attending: Status and management discussed at the patient's bedside with resident physician Dr. Darreld McleanVishal Patel and I concur with his evaluation and management plan. No acute change in patient condition. He remains afebrile now off antibiotics. Placement in long-term facility pending.
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
## 2015-11-16 NOTE — Progress Notes (Signed)
Pt case discussed in LOS meeting this morning.  Decided that [facility] will initiate process to get guardianship of pt since pt decision maker/son has been unable to communicate with hospital staff and pt brother unwilling to make decisions at this time since [name] still legally in charge of care plan.  CSW texted [name] to explain that we would be pursuing guardianship- no response  CSW called pt brother- left voicemail  [facility] believes pt is medically appropriate but request updates for pt- CSW faxed [facility] also agreeable to take patient medically but would have to speak with pt son prior to admission to determine finances  CSW will continue to follow  Merlyn LotJenna Holoman, [facility]WA Clinical Social Worker [phone]
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
## 2015-11-17 LAB — GLUCOSE, CAPILLARY
GLUCOSE-CAPILLARY: 102 mg/dL — AB (ref 65–99)
GLUCOSE-CAPILLARY: 104 mg/dL — AB (ref 65–99)
GLUCOSE-CAPILLARY: 114 mg/dL — AB (ref 65–99)
GLUCOSE-CAPILLARY: 114 mg/dL — AB (ref 65–99)
GLUCOSE-CAPILLARY: 124 mg/dL — AB (ref 65–99)
GLUCOSE-CAPILLARY: 94 mg/dL (ref 65–99)

## 2015-11-17 NOTE — Progress Notes (Signed)
Bagged & lavaged pt at this time. Pt tolerated well.  RT will monitor. 

## 2015-11-17 NOTE — Progress Notes (Signed)
Patient ID: [name], male   DOB: 06/11/1946, 70 y.o.   MRN: [id number] Medicine attending: I examined this patient together with resident physician Dr. Darreld McleanVishal Patel and I concur with his evaluation and management plan. No acute change in patient's status or exam. The hospital has taken the initiative to direct discharge to a long-term care facility that accepts ventilator patients. Patient's son and healthcare power of attorney remains unavailable at this time.

## 2015-11-17 NOTE — Care Management Important Message (Signed)
Important Message  Patient Details  Name: Anthony Avila MRN: [id number] Date of Birth: 02/09/1946   Medicare Important Message Given:  Yes    Dalilah Curlin T, RN 11/17/2015, 12:04 PM

## 2015-11-17 NOTE — Progress Notes (Signed)
CSW received text back from pt son [name] stating that he has been in [place] the past 4 days.  CSW was able to speak to [name] on the phone and he gave verbal permission to speak to pt brother, Tresa EndoKelly, to discuss placement if he was unavailable.  [name] stated that Tresa EndoKelly would also be able to help with financial questions facilities may have.  CSW faxed additional updates to [facility] [place]- they are hopeful to make a bed offer- will inform CSW tomorrow  CSW will continue to follow  Merlyn LotJenna Holoman, [facility] Clinical Social Worker [phone]
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
## 2015-11-18 LAB — BASIC METABOLIC PANEL
ANION GAP: 11 (ref 5–15)
BUN: 24 mg/dL — AB (ref 6–20)
CHLORIDE: 103 mmol/L (ref 101–111)
CO2: 29 mmol/L (ref 22–32)
Calcium: 9.4 mg/dL (ref 8.9–10.3)
Creatinine, Ser: 0.84 mg/dL (ref 0.61–1.24)
GFR calc Af Amer: >60 mL/min (ref >60)
Glucose, Bld: 132 mg/dL — ABNORMAL HIGH (ref 65–99)
POTASSIUM: 3.9 mmol/L (ref 3.5–5.1)
SODIUM: 143 mmol/L (ref 135–145)

## 2015-11-18 LAB — CBC
HCT: 26.4 % — ABNORMAL LOW (ref 39.0–52.0)
HEMOGLOBIN: 8 g/dL — AB (ref 13.0–17.0)
MCH: 25.9 pg — AB (ref 26.0–34.0)
MCHC: 30.3 g/dL (ref 30.0–36.0)
MCV: 85.4 fL (ref 78.0–100.0)
Platelets: 306 10*3/uL (ref 150–400)
RBC: 3.09 MIL/uL — AB (ref 4.22–5.81)
RDW: 16.8 % — ABNORMAL HIGH (ref 11.5–15.5)
WBC: 6.4 10*3/uL (ref 4.0–10.5)

## 2015-11-18 LAB — GLUCOSE, CAPILLARY
GLUCOSE-CAPILLARY: 135 mg/dL — AB (ref 65–99)
GLUCOSE-CAPILLARY: 158 mg/dL — AB (ref 65–99)
Glucose-Capillary: 125 mg/dL — ABNORMAL HIGH (ref 65–99)
Glucose-Capillary: 132 mg/dL — ABNORMAL HIGH (ref 65–99)
Glucose-Capillary: 138 mg/dL — ABNORMAL HIGH (ref 65–99)
Glucose-Capillary: 98 mg/dL (ref 65–99)

## 2015-11-18 NOTE — Progress Notes (Addendum)
5:30pm [facility] feels as if they can accept pt- they need two additional notes- WOC note documenting measurements of wounds and updated hemoglobin levels  CSW paged MD and requested- MD to place orders  CSW to fax this information to facility tomorrow  [facility] would be able to accept pt Tuesday next week- CSW set up transport with [facility] transport for Tuesday at 11am for anticipated DC.  CSW texted pt son to inform of likely transfer next week and insured facility had all appropriate contact information- CSW informed son that [facility] would contact him to finalize financial piece.  3:30pm CSW continues to follow for placement.  [facility] requested additional updates on pt Vent settings- sent  No other bed offers at this time  Merlyn LotJenna Holoman, [facility]WA Clinical Social Worker [phone]

## 2015-11-18 NOTE — Progress Notes (Signed)
Patient ID: Anthony Avila, male   DOB: 04/16/1946, 70 y.o.   MRN: [id number] Medicine attending: Clinical status and management plan discussed at the bedside with resident physician Dr. Darreld McleanVishal Patel. Single low-grade temperature 100.5 noted yesterday at 2011. Afebrile since then. Oxygenation is good. Fluctuations in blood pressure but overall better control on current regimen. No other acute changes in his condition. Contact was made with his son who is currently in [place]. Some decision making powers were delegated to the patient's brother who is here in [place]. We will move ahead with plans to place in a LTAC.
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
## 2015-11-18 NOTE — Progress Notes (Signed)
11/18/2015- Respiratory care note- Pt continues on full support- chronic vent dependent.  Pt has a #7xlt in place.  Trach care supplies at bedside.  No issues noted at time of eval.  Will cont to monitor progress.

## 2015-11-18 NOTE — Progress Notes (Signed)
Bagged & lavaged pt at this time. Pt tolerated well.  RT will monitor.

## 2015-11-19 DIAGNOSIS — Z79899 Other long term (current) drug therapy: Secondary | ICD-10-CM

## 2015-11-19 DIAGNOSIS — G8929 Other chronic pain: Secondary | ICD-10-CM

## 2015-11-19 DIAGNOSIS — Z7951 Long term (current) use of inhaled steroids: Secondary | ICD-10-CM

## 2015-11-19 LAB — GLUCOSE, CAPILLARY
Glucose-Capillary: 113 mg/dL — ABNORMAL HIGH (ref 65–99)
Glucose-Capillary: 118 mg/dL — ABNORMAL HIGH (ref 65–99)
Glucose-Capillary: 121 mg/dL — ABNORMAL HIGH (ref 65–99)
Glucose-Capillary: 141 mg/dL — ABNORMAL HIGH (ref 65–99)
Glucose-Capillary: 156 mg/dL — ABNORMAL HIGH (ref 65–99)
Glucose-Capillary: 90 mg/dL (ref 65–99)

## 2015-11-19 NOTE — Consult Note (Addendum)
WOC wound re-consult note Reason for Consult: Requested re-assessment of sacral wound to provide updated measurements for SNF transfer. Refer to previous consult note on 2/15 and 3/15 for earlier wound assessments. Pt has multiple systemic factors which can impair healing, and wound is located close to rectum and is frequently incontinent of stool; it is difficult to keep wound from becoming soiled.  Sacrum with stage 4 pressure injury; size has decreased since previous measurements were obtained. 8.5X5.5 X1cm with undermining to wound edges with a depth of 1.2 cm. Wound is beefy red with bone palpable, mod amt green drainage which apparently a new development, according to the bedside nurse, and slight odor. Previously noted wounds to right buttock and left heel have now healed and are pink intact scar tissue. Pressure Ulcer POA: Yes Dressing procedure/placement/frequency: Remains on air mattress to reduce pressure and is wearing Prevalon boots to BLE to float heels.Begin Aquacel to provide antimicrobial benefits and absorb drainage to sacrum.  Discussed plan of care with patient; no family present to discuss plan of care. Please re-consult if further assistance is needed. Thank-you,  Cammie Mcgeeawn Lenore Moyano MSN, RN, CWOCN, [place]CN-AP, CNS [phone]
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
## 2015-11-19 NOTE — Progress Notes (Signed)
Patient ID: Anthony Avila, male   DOB: 01/12/1946, 70 y.o.   MRN: [id number] Medicine attending: I examined this patient this morning together with resident physician Dr. Darreld McleanVishal Patel and I concur with his evaluation and management plan. No acute change inpatient status. Scattered coarse rhonchi over the lung fields. Ventilator dependent. Oxygenating well at this time. Blood sugars in good range. Blood pressure improved on current medications. Hemoglobin 8.0 no change from baseline, anemia of chronic disease. Ferritin 179 when checked on February 14. Patient's niece Anthony Avila is here. She is a physical therapist. [facility] [place]. Plan is transfer to that facility on Tuesday, April 4.

## 2015-11-19 NOTE — Progress Notes (Signed)
RT Note: Patient was bagged and lavaged and tolerated the procedure well. A moderate amount of thick yellow secretions were suctioned from patients trach. No adverse effects noted. Rt will continue to monitor.

## 2015-11-19 NOTE — Progress Notes (Signed)
CSW faxed Hemoglobin and wound care note to [facility] and admissions staff knows to look for it.  Osborne Cascoadia Mieka Leaton LCSWA [phone]

## 2015-11-19 NOTE — Care Management Important Message (Signed)
Important Message  Patient Details  Name: Anthony Avila MRN: [id number] Date of Birth: 10/26/1945   Medicare Important Message Given:  Yes    Cherylann ParrClaxton, Belkys Henault S, RN 11/19/2015, 12:54 PM

## 2015-11-20 LAB — GLUCOSE, CAPILLARY
GLUCOSE-CAPILLARY: 152 mg/dL — AB (ref 65–99)
GLUCOSE-CAPILLARY: 112 mg/dL — AB (ref 65–99)
GLUCOSE-CAPILLARY: 122 mg/dL — AB (ref 65–99)
Glucose-Capillary: 107 mg/dL — ABNORMAL HIGH (ref 65–99)
Glucose-Capillary: 116 mg/dL — ABNORMAL HIGH (ref 65–99)
Glucose-Capillary: 135 mg/dL — ABNORMAL HIGH (ref 65–99)

## 2015-11-20 NOTE — Progress Notes (Signed)
Patient ID: Grafton FolkWayne Desai, male   DOB: 10/04/1945, 70 y.o.   MRN: [id number] Medicine attending: Status reviewed with resident physician Dr. Heywood Ilesushil Patel and I concur with his evaluation and management plan. No acute change in patient condition. On track for transfer to extended care facility on Tuesday. We appreciate reevaluation by wound care service. 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 Previously noted once the right buttock and left heel have now healed. Recommendation to apply Aquacel to provide antimicrobial benefits and absorb drainage to the sacral area.
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
## 2015-11-21 ENCOUNTER — Other Ambulatory Visit: Payer: Self-pay

## 2015-11-21 DIAGNOSIS — K219 Gastro-esophageal reflux disease without esophagitis: Secondary | ICD-10-CM

## 2015-11-21 LAB — GLUCOSE, CAPILLARY
GLUCOSE-CAPILLARY: 123 mg/dL — AB (ref 65–99)
Glucose-Capillary: 103 mg/dL — ABNORMAL HIGH (ref 65–99)
Glucose-Capillary: 114 mg/dL — ABNORMAL HIGH (ref 65–99)
Glucose-Capillary: 123 mg/dL — ABNORMAL HIGH (ref 65–99)
Glucose-Capillary: 123 mg/dL — ABNORMAL HIGH (ref 65–99)
Glucose-Capillary: 96 mg/dL (ref 65–99)

## 2015-11-21 NOTE — Progress Notes (Signed)
Patient ID: Anthony Avila, male   DOB: 06/19/1946, 70 y.o.   MRN: [id number] Medicine attending: I examined this patient this morning together with resident physician Dr. Heywood Ilesushil Patel and I concur with his evaluation and management plan which we discussed together. Patient is now complaining of intermittent chest tightness. There are no acute changes on electrocardiogram. When we walked into the room to evaluate him he was resting comfortably with his eyes closed. Regular cardiac rhythm. Lungs clear. Tender in the epigastrium. Oxygenating well. Blood pressure under good control. No clinical suspicion for acute myocardial ischemia. Symptoms likely related to his reflux. Continue PPI.

## 2015-11-21 NOTE — Progress Notes (Signed)
11/21/2015  During bedside  Report at 0743 patient c/o c/p right and left side of chest, he describe tightness in chest. 12 lead  EKG was order by third shift nurse. EKG result patient was Normal Sinus Rhythm right bundle branch block and ventricular rate was 67 bpm. Patient vital sign.  154/78, 67, 99 saturation and 16 respiration. Internal medicine was notified and was made aware. [facility] Makaylen Thieme RN.

## 2015-11-21 NOTE — Progress Notes (Signed)
Lavaged and suctioned pt
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
## 2015-11-22 DIAGNOSIS — F28 Other psychotic disorder not due to a substance or known physiological condition: Secondary | ICD-10-CM

## 2015-11-22 LAB — GLUCOSE, CAPILLARY
GLUCOSE-CAPILLARY: 104 mg/dL — AB (ref 65–99)
GLUCOSE-CAPILLARY: 113 mg/dL — AB (ref 65–99)
Glucose-Capillary: 102 mg/dL — ABNORMAL HIGH (ref 65–99)
Glucose-Capillary: 104 mg/dL — ABNORMAL HIGH (ref 65–99)
Glucose-Capillary: 107 mg/dL — ABNORMAL HIGH (ref 65–99)
Glucose-Capillary: 115 mg/dL — ABNORMAL HIGH (ref 65–99)

## 2015-11-22 LAB — BASIC METABOLIC PANEL
Anion gap: 10 (ref 5–15)
BUN: 33 mg/dL — AB (ref 6–20)
CALCIUM: 9.5 mg/dL (ref 8.9–10.3)
CO2: 29 mmol/L (ref 22–32)
CREATININE: 0.94 mg/dL (ref 0.61–1.24)
Chloride: 103 mmol/L (ref 101–111)
GFR calc Af Amer: >60 mL/min (ref >60)
GLUCOSE: 115 mg/dL — AB (ref 65–99)
POTASSIUM: 3.9 mmol/L (ref 3.5–5.1)
SODIUM: 142 mmol/L (ref 135–145)

## 2015-11-22 LAB — MAGNESIUM: MAGNESIUM: 2.3 mg/dL (ref 1.7–2.4)

## 2015-11-22 NOTE — Progress Notes (Signed)
Patient ID: Anthony Avila, male   DOB: 03/16/1946, 70 y.o.   MRN: [id number] Medicine attending: Database reviewed and clinical management discussed at the bedside with resident physician Dr. Ruben ImJeremy Ford and I concur with his evaluation and management plan. No further episodes of chest tightness. Oxygenation remains excellent on 30% FiO2. He is still having occasional hallucinosis. I think this reflects the fact that he has now been hospitalized for almost 2 months. Anticipate transfer to long-term care facility tomorrow.

## 2015-11-22 NOTE — Progress Notes (Signed)
[facility] [place] can medically accept pt and will have bed for pt tomorrow (4/4) and expecting admission.  CSW had informed pt son, Anthony Avila, of this offer last week and son was agreeable- this morning son texted CSW to inform that family no longer agreeable due to poor reviews of facility and requested CSW follow [place]. CSW explained that we are unable to keep pt here to wait on specific bed when pt has been medically stable for past 2 weeks for discharge- son was upset at this and stating that he has the right to change his mind  CSW explained that no vent SNF was in [place] but called facility and confirmed it was an LTACH- CSW attempted to call son and update- sent text message  CSW spoke in depth with pt brother informing of barriers to placement in [place], bed availability, pt poor condition, expensive medications- explained that since [facility] has a bed and pt has safe discharge option that pt would be discharged tomorrow, Tuesday. Explained that if family was refusing placement at [facility] that Medicare would be informed of DC and family would be come responsible for hospital bill.  Pt brother expresses understanding- states that even though Anthony Avila states Tresa EndoKelly can be part of decision making Tresa EndoKelly does not feel comfortable making any decisions without taking to [employer].  Tresa EndoKelly will continue to try to talk to Anthony Avila to inform of conversation and come to decision.  CSW confirmed that [facility] and [facility] do not have beds available and do no anticipate any tomorrow.  CSW texted Anthony Avila and informed that if decision is not made by 8am tomorrow that transport will have to be canceled [facility] at that time and then process would begin for Medicare to stop paying.  CSW received phone call from son at 6pm stating that we could go ahead with transfer to [facility].  Plan [facility]  CSW will continue to follow  Merlyn LotJenna Holoman,  [facility]CSWA Clinical Social Worker [phone]

## 2015-11-22 NOTE — Progress Notes (Signed)
Patient lavaged and suctioned, obtaining moderate amount of thick, yellowish-tan secretions. Vitals stable.
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
## 2015-11-23 LAB — GLUCOSE, CAPILLARY
GLUCOSE-CAPILLARY: 106 mg/dL — AB (ref 65–99)
GLUCOSE-CAPILLARY: 112 mg/dL — AB (ref 65–99)
GLUCOSE-CAPILLARY: 121 mg/dL — AB (ref 65–99)
Glucose-Capillary: 97 mg/dL (ref 65–99)
Glucose-Capillary: 130 mg/dL — ABNORMAL HIGH (ref 65–99)
Glucose-Capillary: 98 mg/dL (ref 65–99)

## 2015-11-23 MED ORDER — ALPRAZOLAM 0.5 MG PO TABS: 0.5000 mg | ORAL_TABLET | Freq: Three times a day (TID) | ORAL | Status: AC | PRN

## 2015-11-23 MED ORDER — OXYCODONE-ACETAMINOPHEN 5-325 MG PO TABS: 1.0000 | ORAL_TABLET | Freq: Four times a day (QID) | ORAL | Status: AC | PRN

## 2015-11-23 MED ORDER — CLONAZEPAM 1 MG PO TABS: 1.0000 mg | ORAL_TABLET | Freq: Four times a day (QID) | ORAL | Status: AC

## 2015-11-23 NOTE — Progress Notes (Signed)
Lavaged and suctioned patient. Tolerated well. Moderate return of tan-yellow secretions.

## 2015-11-23 NOTE — Progress Notes (Signed)
Patient lavaged and suctioned with return of many yellowing tan secretions and mucus plugs.

## 2015-11-23 NOTE — Progress Notes (Signed)
CSW met with patient's son, brother, and Carnuel from [place]. [place] Carol will contact CSW tomorrow regarding insurance approval. CSW will then be able to schedule transport to facility.  Percell Locus Alfred Eckley LCSWA [phone]

## 2015-11-23 NOTE — Progress Notes (Signed)
Patient ID: Anthony Avila, male   DOB: 12/08/1945, 70 y.o.   MRN: [id number] Medicine attending: I examined this patient this morning together with resident physician Dr. Riki RuskJeremy fortnight concur with his evaluation and management plan. No acute changes in the patient's condition. Unfortunately, plans to transfer to extended care facility have been temporarily postponed pending further discussion with the patient's son and our care management team.

## 2015-11-23 NOTE — Progress Notes (Signed)
CSW spoke with [name] at [facility] who states patient's son is now presenting new insurance information. Facility unable to accept until insurance info is received. Patient's son to meet with [name] today to dicuss payment options.   [facility] transportation has been canceled for today.   Anthony Avila LCSWA [phone]

## 2015-11-23 NOTE — Progress Notes (Signed)
   Subjective: Patient was complaining of some eye burning after applying eye drops, but he otherwise had no complaints. He is anticipating being discharged today or in the coming days.   Objective: Vital signs in last 24 hours: Filed Vitals:   11/23/15 0812 11/23/15 0920 11/23/15 1138 11/23/15 1156  BP: 163/80 163/80 177/90 177/90  Pulse: 61 66 68 68  Temp: 98.4 F (36.9 C)  98.8 F (37.1 C) 98.8 F (37.1 C)  TempSrc: Oral  Oral Oral  Resp: 16 26 16 23   Height:      Weight:      SpO2: 100% 100% 98% 100%   Weight change: 0.8 oz (0.024 kg)  Intake/Output Summary (Last 24 hours) at 11/23/15 1212 Last data filed at 11/23/15 1157  Gross per 24 hour  Intake   2045 ml  Output   1580 ml  Net    465 ml   General: Lying in bed, NAD Neck: trach collar in place without drainage Cardiovascular: regular rate and rhythm without rubs, murmurs or gallops Pulm: mechanical breath sounds in the anterior lung fields Abd: PEG tube in place, normoactive bowel sounds Ext: warm and well perfused, SCDs in place  Assessment/Plan:   Multi-drug resistant Pseudomonas HCAP: Completed Gentamicin course (2/15>>2/28). BAL culture collected from [facility] on 3/6 is sensitive to Zerbaxa. Continues to be high risk for recurrent pneumonia due to aspiration. He remains afebrile. -Completed 14 day course of IV Zerbaxa (3/10>>3/24 )  -Completed Tobramycin nebulizer course (3/6 >>3/21)  Chronic ventilator dependence s/p tracheostomy: Trach changed on 3/13. Patient with continued severe secretions.  -Continue aggressive pulmonary toilet -Continue albuterol nebulizer QID -Continue Bag/lavage, Suctioning, and Chest physiotherapy  HTN:  -Continue Amlodipine 5 mg qd -Hydralazine prn SBP >180, DBP >110  HIV: Continue Dolutegravir and Emtricitabine-Tenofovir  Diabetes mellitius: Continue Lantus 23u QHS with SSI-M  Pain/Anxiety/Quadriplegia: -Continue Percocet 5-325 mg per tube q6h prn -Continue home Xanax 0.5  mg TID prn -Continue home Klonopin 1 mg per tube q6h -Continue Seroquel 50 mg qhs -Continue Cymbalta 60 mg qd -Continue Baclofen 10 mg 4 times daily -Continue Tylenol 650 mg q6h prn  GERD: Continue Protonix 40mg  40mg /2420mL solution.   Dispo:  Anticipated transfer to [facility] in [place] today , but case management and social work are still working with family on [employer].   LOS: 49 days   Ruben ImJeremy Armonte Tortorella, MD 11/23/2015, 12:12 PM

## 2015-11-23 NOTE — Discharge Instructions (Signed)
Mr. Anthony Avila, it was a pleasure taking care of you in the hospital.  You were here for a pneumonia related to you ventilator. We treated you with two courses of antibiotics. The second one we tried, Zerbaxa was effective. You have completed all of your antibiotics and do not need to take any more.  You had elevated blood pressure in the hospital, so we started you on amlodipine, a medication to lower your blood pressure.  Again, it was a pleasure taking care of you.  We have found you an appropriate facility that can assist you and manage your ventilator.

## 2015-11-24 DIAGNOSIS — I10 Essential (primary) hypertension: Secondary | ICD-10-CM | POA: Insufficient documentation

## 2015-11-24 LAB — GLUCOSE, CAPILLARY
GLUCOSE-CAPILLARY: 112 mg/dL — AB (ref 65–99)
GLUCOSE-CAPILLARY: 121 mg/dL — AB (ref 65–99)
GLUCOSE-CAPILLARY: 68 mg/dL (ref 65–99)
Glucose-Capillary: 94 mg/dL (ref 65–99)
Glucose-Capillary: 106 mg/dL — ABNORMAL HIGH (ref 65–99)
Glucose-Capillary: 108 mg/dL — ABNORMAL HIGH (ref 65–99)
Glucose-Capillary: 91 mg/dL (ref 65–99)

## 2015-11-24 NOTE — Progress Notes (Signed)
Patient ID: Anthony Avila, male   DOB: 04/03/1946, 70 y.o.   MRN: [id number] Medicine attending:  No acute changes in the patient's status. We are waiting for final insurance approval and anticipation of transfer to long-term care facility. Wide fluctuations in blood pressure. Stable oxygenation. Stable blood glucose values. No significant fever off all antibiotics.

## 2015-11-24 NOTE — Care Management Important Message (Signed)
Important Message  Patient Details  Name: Grafton FolkWayne Caccavale MRN: [id number] Date of Birth: 01/24/1946   Medicare Important Message Given:  Yes Late Entry:  Important Message given to pt's son, Sunnie Nielsenierre Mclucas, on 11/23/15 @ 3:30 p.m.  Joslin Doell, Chapman FitchHenrietta T, RN 11/24/2015, 6:43 AM

## 2015-11-24 NOTE — Progress Notes (Signed)
CSW received confirmation that patient can be admitted [facility] on 11/[facility]/15. [facility] transportation has been scheduled for pickup at 12pm.  Cristobal GoldmannNadia Manjinder Breau LCSWA [phone]

## 2015-11-24 NOTE — Progress Notes (Signed)
   Subjective: Patient had no acute complaints this morning. He said he is comfortable for discharge to a new facility at any time.  Objective: Vital signs in last 24 hours: Filed Vitals:   11/24/15 1000 11/24/15 1207 11/24/15 1507 11/24/15 1510  BP: 154/77 185/88  145/72  Pulse: 60 68  57  Temp:  98.7 F (37.1 C)    TempSrc:  Oral    Resp: 17 24  20   Height:      Weight:      SpO2: 100% 100% 100% 100%   Weight change: -10.6 oz (-0.3 kg)  Intake/Output Summary (Last 24 hours) at 11/24/15 1517 Last data filed at 11/24/15 1100  Gross per 24 hour  Intake   2113 ml  Output   2650 ml  Net   -537 ml   General: Lying in bed, NAD Neck: trach collar in place without drainage Cardiovascular: regular rate and rhythm without rubs, murmurs or gallops Pulm: mechanical breath sounds in the anterior lung fields Abd: PEG tube in place, normoactive bowel sounds Ext: warm and well perfused, SCDs in place  Assessment/Plan:   Multi-drug resistant Pseudomonas HCAP: Completed Gentamicin course (2/15>>2/28). BAL culture collected from [facility] on 3/6 is sensitive to Zerbaxa. Continues to be high risk for recurrent pneumonia due to aspiration. He remains afebrile. -Completed 14 day course of IV Zerbaxa (3/10>>3/24 )  -Completed Tobramycin nebulizer course (3/6 >>3/21)  Chronic ventilator dependence s/p tracheostomy: Trach changed on 3/13. Patient with continued severe secretions.  -Continue aggressive pulmonary toilet -Continue albuterol nebulizer QID -Continue Bag/lavage, Suctioning, and Chest physiotherapy  HTN:  -Continue Amlodipine 5 mg qd -Hydralazine prn SBP >180, DBP >110  HIV: Continue Dolutegravir and Emtricitabine-Tenofovir  Diabetes mellitius: Continue Lantus 23u QHS with SSI-M  Pain/Anxiety/Quadriplegia: -Continue Percocet 5-325 mg per tube q6h prn -Continue home Xanax 0.5 mg TID prn -Continue home Klonopin 1 mg per tube q6h -Continue Seroquel 50 mg qhs -Continue Cymbalta  60 mg qd -Continue Baclofen 10 mg 4 times daily -Continue Tylenol 650 mg q6h prn  GERD: Continue Protonix 40mg  40mg /3220mL solution.   Dispo:  Anticipated transfer to [facility] in [place] pending resolution of insurance issues.    LOS: 50 days   Ruben ImJeremy Milanni Ayub, MD 11/24/2015, 3:17 PM

## 2015-11-25 DIAGNOSIS — R0689 Other abnormalities of breathing: Secondary | ICD-10-CM | POA: Insufficient documentation

## 2015-11-25 DIAGNOSIS — R0902 Hypoxemia: Secondary | ICD-10-CM | POA: Insufficient documentation

## 2015-11-25 DIAGNOSIS — J189 Pneumonia, unspecified organism: Secondary | ICD-10-CM | POA: Insufficient documentation

## 2015-11-25 LAB — GLUCOSE, CAPILLARY
GLUCOSE-CAPILLARY: 123 mg/dL — AB (ref 65–99)
Glucose-Capillary: 121 mg/dL — ABNORMAL HIGH (ref 65–99)
Glucose-Capillary: 87 mg/dL (ref 65–99)

## 2015-11-25 MED ORDER — ALBUTEROL SULFATE (2.5 MG/3ML) 0.083% IN NEBU
INHALATION_SOLUTION | RESPIRATORY_TRACT | Status: AC
  Filled 2015-11-25: qty 3

## 2015-11-25 NOTE — Progress Notes (Signed)
Called report to [facility]. Spoke to ARAMARK CorporationDoretta Collins, Charity fundraiserN. Will continue to monitor pt.

## 2015-11-25 NOTE — Progress Notes (Signed)
Pt transported to [facility] via [facility]. Family to accompany pt to facility.

## 2015-11-25 NOTE — Progress Notes (Signed)
[facility] Anticipated discharge date: 4/6 Family notified: informed pt son, Bobetta Limeierre [facility]- scheduled for 12pm  CSW signing off.  Merlyn LotJenna Holoman, LCSWA Clinical Social Worker [phone]

## 2015-11-25 NOTE — [place] FL2 (Signed)
[place] MEDICAID FL2 LEVEL OF CARE SCREENING TOOL     IDENTIFICATION  Patient Name: Anthony Avila Birthdate: 10/06/1945 Sex: male Admission Date (Current Location): 10/05/2015  [facility] and [place] Number:  Producer, television/film/video and Address:  [facility] [place], [address], [place], [place] [address]      Provider Number: [id number]  Attending Physician Name and Address:  Levert Feinstein, MD  Relative Name and Phone Number:  [place]    Current Level of Care: Hospital Recommended Level of Care: Skilled Nursing Facility Prior Approval Number:    Date Approved/Denied:   PASRR Number: [id number]  Discharge Plan: SNF    Current Diagnoses: Patient Active Problem List   Diagnosis Date Noted  . HTN (hypertension), benign   . History of MDR Pseudomonas aeruginosa infection   . Acute respiratory failure with hypoxemia (HCC)   . Left lower lobe pneumonia   . Normocytic anemia   . Decubitus ulcer of sacral region, stage 4 (HCC)   . Altered mental status   . Sacral decubitus ulcer   . Quadriplegia (HCC)   . Chronic neuromuscular respiratory failure (HCC)   . Chronic respiratory failure (HCC)   . Decubitus ulcer of sacral region, stage 3 (HCC)   . Diabetes mellitus (HCC) 10/05/2015  . Chronic constipation 10/05/2015  . Acute encephalopathy   . Anasarca   . 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 Acute on chronic respiratory failure with hypoxia (HCC) 06/16/2015  . Lung collapse   . 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 Acute respiratory failure (HCC)   . Neck pain   . Ventilator dependence (HCC)   . Anemia of chronic disease   . HIV (human immunodeficiency virus infection) (HCC)   . Palliative care encounter   . 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 Atelectasis   . Apnea   . Arm swelling   . Leaking PEG tube (HCC)   . Pulmonary edema     Orientation RESPIRATION  BLADDER Height & Weight     Self, Place  Tracheostomy, Vent Incontinent, Indwelling catheter Weight: 183 lb (83.008 kg) Height:   (188 cm)  BEHAVIORAL SYMPTOMS/MOOD NEUROLOGICAL BOWEL NUTRITION STATUS      Incontinent Feeding tube  AMBULATORY STATUS COMMUNICATION OF NEEDS Skin   Total Care Verbally PU Stage and Appropriate Care       PU Stage 4 Dressing: Daily               Personal Care Assistance Level of Assistance  Total care       Total Care Assistance: Maximum assistance   Functional Limitations Info  Sight, Hearing, Speech Sight Info: Adequate Hearing Info: Adequate Speech Info: Adequate    SPECIAL CARE FACTORS FREQUENCY  PT (By licensed PT), OT (By licensed OT)                    Contractures      Additional Factors Info  Code Status, Allergies, Psychotropic, Insulin Sliding Scale, Isolation Precautions, Suctioning Needs Code Status Info: Partial Allergies Info: NKA Psychotropic Info: klonopin, cymbalta, seroquel Insulin Sliding Scale Info: 6/day Isolation Precautions Info: MDRO     Current Medications (11/25/2015):  This is the current hospital active medication list Current Facility-Administered Medications  Medication Dose Route Frequency Provider Last Rate Last Dose  . 0.9 %  sodium chloride infusion   Intravenous Continuous PRN Doneen Poisson, MD 10 mL/hr at  11/10/15 1603 500 mL at 11/10/15 1603  . acetaminophen (TYLENOL) solution 650 mg  650 mg Oral Q6H PRN Darreld McleanVishal Patel, MD   650 mg at 11/18/15 0049  . albuterol (PROVENTIL) (2.5 MG/3ML) 0.083% nebulizer solution 2.5 mg  2.5 mg Nebulization Q6H Lora PaulaJennifer T Krall, MD   2.5 mg at 11/25/15 [id number]  . albuterol (PROVENTIL) (2.5 MG/3ML) 0.083% nebulizer solution           . ALPRAZolam Prudy Feeler(XANAX) tablet 0.5 mg  0.5 mg Per Tube TID PRN Darreld McleanVishal Patel, MD   0.5 mg at 11/25/15 0428  . amLODipine (NORVASC) tablet 5 mg  5 mg Per Tube Daily Darreld McleanVishal Patel, MD   5 mg at 11/25/15 0943  . antiseptic oral rinse  solution (CORINZ)  7 mL Mouth Rinse QID Doneen PoissonLawrence Klima, MD   7 mL at 11/25/15 0424  . baclofen (LIORESAL) tablet 10 mg  10 mg Per Tube QID Otis BraceMarjan Rabbani, MD   10 mg at 11/25/15 0845  . bisacodyl (DULCOLAX) suppository 10 mg  10 mg Rectal QHS Darreld McleanVishal Patel, MD   10 mg at 11/19/15 2114  . chlorhexidine gluconate (PERIDEX) 0.12 % solution 15 mL  15 mL Mouth Rinse BID Doneen PoissonLawrence Klima, MD   15 mL at 11/25/15 0830  . clonazePAM (KLONOPIN) tablet 1 mg  1 mg Per Tube Q6H Marjan Rabbani, MD   1 mg at 11/25/15 0648  . dolutegravir (TIVICAY) tablet 50 mg  50 mg Oral Daily Marjan Rabbani, MD   50 mg at 11/25/15 0943  . DULoxetine (CYMBALTA) DR capsule 60 mg  60 mg Oral Daily Levert FeinsteinJames M Granfortuna, MD   60 mg at 11/25/15 0944  . emtricitabine-tenofovir AF (DESCOVY) 200-25 MG per tablet 1 tablet  1 tablet Per Tube Daily Otis BraceMarjan Rabbani, MD   1 tablet at 11/25/15 0944  . enoxaparin (LOVENOX) injection 40 mg  40 mg Subcutaneous Daily Marjan Rabbani, MD   40 mg at 11/25/15 0942  . feeding supplement (VITAL AF 1.2 CAL) liquid 1,000 mL  1,000 mL Per Tube Continuous Doneen PoissonLawrence Klima, MD 75 mL/hr at 11/24/15 2007 1,000 mL at 11/24/15 2007  . free water 200 mL  200 mL Per Tube 3 times per day Alexa Lucrezia Starch Burns, MD   200 mL at 11/25/15 0600  . hydrALAZINE (APRESOLINE) injection 10 mg  10 mg Intravenous Q8H PRN Lora PaulaJennifer T Krall, MD   10 mg at 11/13/15 2156  . insulin aspart (novoLOG) injection 0-15 Units  0-15 Units Subcutaneous 6 times per day Servando SnareAlexa R Burns, MD   2 Units at 11/25/15 0830  . insulin glargine (LANTUS) injection 23 Units  23 Units Subcutaneous QHS Lora PaulaJennifer T Krall, MD   23 Units at 11/25/15 0003  . multivitamin liquid 15 mL  15 mL Per Tube Daily Doneen PoissonLawrence Klima, MD   15 mL at 11/25/15 0943  . ondansetron (ZOFRAN) tablet 4 mg  4 mg Oral Q6H PRN Marjan Rabbani, MD       Or  . ondansetron (ZOFRAN) injection 4 mg  4 mg Intravenous Q6H PRN Marjan Rabbani, MD      . oxyCODONE-acetaminophen (PERCOCET/ROXICET) 5-325 MG per  tablet 1 tablet  1 tablet Per Tube Q6H PRN Jana HalfNicholas A Taylor, MD   1 tablet at 11/25/15 1051  . pantoprazole sodium (PROTONIX) 40 mg/20 mL oral suspension 40 mg  40 mg Per Tube Daily Doneen PoissonLawrence Klima, MD   40 mg at 11/25/15 0943  . polyethylene glycol (MIRALAX / GLYCOLAX) packet 17 g  17  g Per Tube Daily PRN Levert Feinstein, MD      . QUEtiapine (SEROQUEL) tablet 50 mg  50 mg Per Tube QHS Otis Brace, MD   50 mg at 11/24/15 2148  . senna-docusate (Senokot-S) tablet 1 tablet  1 tablet Per Tube QHS PRN Levert Feinstein, MD   1 tablet at 11/14/15 2115  . sodium chloride flush (NS) 0.9 % injection 10-40 mL  10-40 mL Intracatheter Q12H Doneen Poisson, MD   10 mL at 11/25/15 0945  . sodium chloride flush (NS) 0.9 % injection 10-40 mL  10-40 mL Intracatheter PRN Doneen Poisson, MD   10 mL at 11/24/15 0918  . sodium chloride flush (NS) 0.9 % injection 3 mL  3 mL Intravenous Q12H Marjan Rabbani, MD   3 mL at 11/25/15 1191     Discharge Medications: Please see discharge summary for a list of discharge medications.  Relevant Imaging Results:  Relevant Lab Results:   Additional Information SS#: [id number]  Izora Ribas, [place]

## 2015-11-25 NOTE — Progress Notes (Signed)
   Subjective: Patient had no acute complaints this morning. He said he is comfortable for discharge to his new facility  Objective: Vital signs in last 24 hours: Filed Vitals:   11/25/15 0530 11/25/15 0600 11/25/15 0812 11/25/15 0822  BP: 132/83 137/69 118/72 118/72  Pulse: 54 52 54 56  Temp:   98.5 F (36.9 C)   TempSrc:   Axillary   Resp: 16 16 16 20   Height:      Weight:      SpO2: 95% 99% 100% 100%   Weight change: -4 lb 6.3 oz (-1.992 kg)  Intake/Output Summary (Last 24 hours) at 11/25/15 1030 Last data filed at 11/25/15 0800  Gross per 24 hour  Intake   2075 ml  Output   2350 ml  Net   -275 ml   General: Lying in bed, NAD Neck: trach collar in place without drainage Cardiovascular: regular rate and rhythm without rubs, murmurs or gallops Pulm: mechanical breath sounds in the anterior lung fields Abd: PEG tube in place, normoactive bowel sounds Ext: warm and well perfused, SCDs in place  Assessment/Plan:   Multi-drug resistant Pseudomonas HCAP: Completed Gentamicin course (2/15>>2/28). BAL culture collected from [facility] on 3/6 is sensitive to Zerbaxa. Continues to be high risk for recurrent pneumonia due to aspiration. He remains afebrile. -Completed 14 day course of IV Zerbaxa (3/10>>3/24 )  -Completed Tobramycin nebulizer course (3/6 >>3/21) - Remove PICC line on discharge  Chronic ventilator dependence s/p tracheostomy: Trach changed on 3/13. Patient with continued severe secretions.  -Continue aggressive pulmonary toilet -Continue albuterol nebulizer QID -Continue Bag/lavage, Suctioning, and Chest physiotherapy  HTN:  -Continue Amlodipine 5 mg qd -Hydralazine prn SBP >180, DBP >110  HIV: Continue Dolutegravir and Emtricitabine-Tenofovir  Diabetes mellitius: Continue Lantus 23u QHS with SSI-M  Pain/Anxiety/Quadriplegia: -Continue Percocet 5-325 mg per tube q6h prn -Continue home Xanax 0.5 mg TID prn -Continue home Klonopin 1 mg per tube q6h -Continue  Seroquel 50 mg qhs -Continue Cymbalta 60 mg qd -Continue Baclofen 10 mg 4 times daily -Continue Tylenol 650 mg q6h prn  GERD: Continue Protonix 40mg  40mg /4920mL solution.   Dispo:  Anticipated transfer to [facility] in [place] pending resolution of insurance issues.    LOS: 51 days   Ruben ImJeremy Lateya Dauria, MD 11/25/2015, 10:30 AM

## 2017-03-09 IMAGING — CR DG CHEST 1V PORT
1 series · 1 of 1 positions shown · non-contrast
Comparison: Portable chest x-ray June 15, 2015

CLINICAL DATA: Respiratory failure, healthcare associated
pneumonia, HIV, quadriplegic.

EXAM:
PORTABLE CHEST 1 VIEW

[AP]
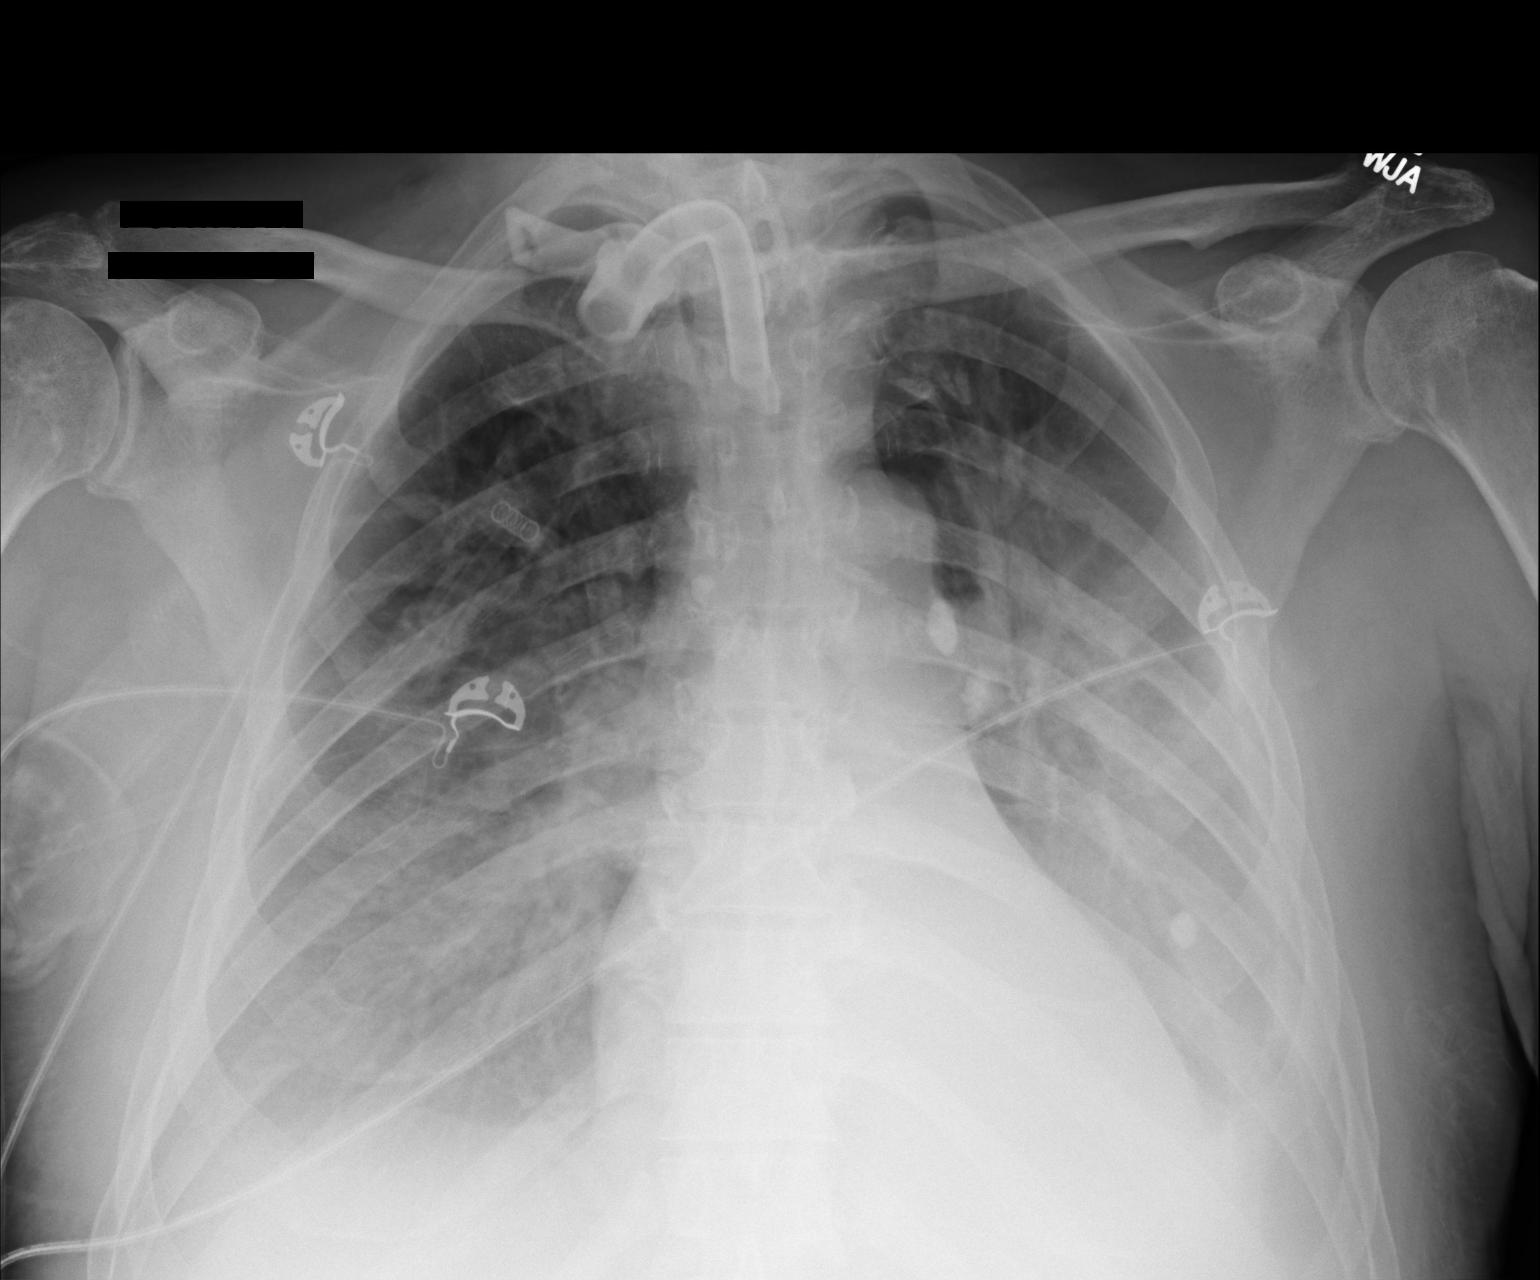

[1 of 1 positions shown; findings below may reference images not displayed]

FINDINGS: There has been mild interval increase in the bilateral airspace
opacities. The left hemidiaphragm remains obscured and there is
partial obscuration of the right hemidiaphragm. The heart is normal
in size. The tracheostomy appliance tip projects at the level of the
inferior margin of the clavicular heads. Calcified nodules are
present on the left and are stable.
IMPRESSION: Worsening airspace opacity dictation bilaterally consistent with
pneumonia. There are bilateral pleural effusions layering
posteriorly.

## 2017-03-09 IMAGING — DX DG CHEST 1V PORT
2 series · 2 of 2 positions shown · non-contrast
Comparison: Radiographs 05/24/2015 and 05/23/2015.

CLINICAL DATA: Tracheostomy patient with respiratory distress for 1
day.

EXAM:
PORTABLE CHEST 1 VIEW

[chest ap (1 of 2)]
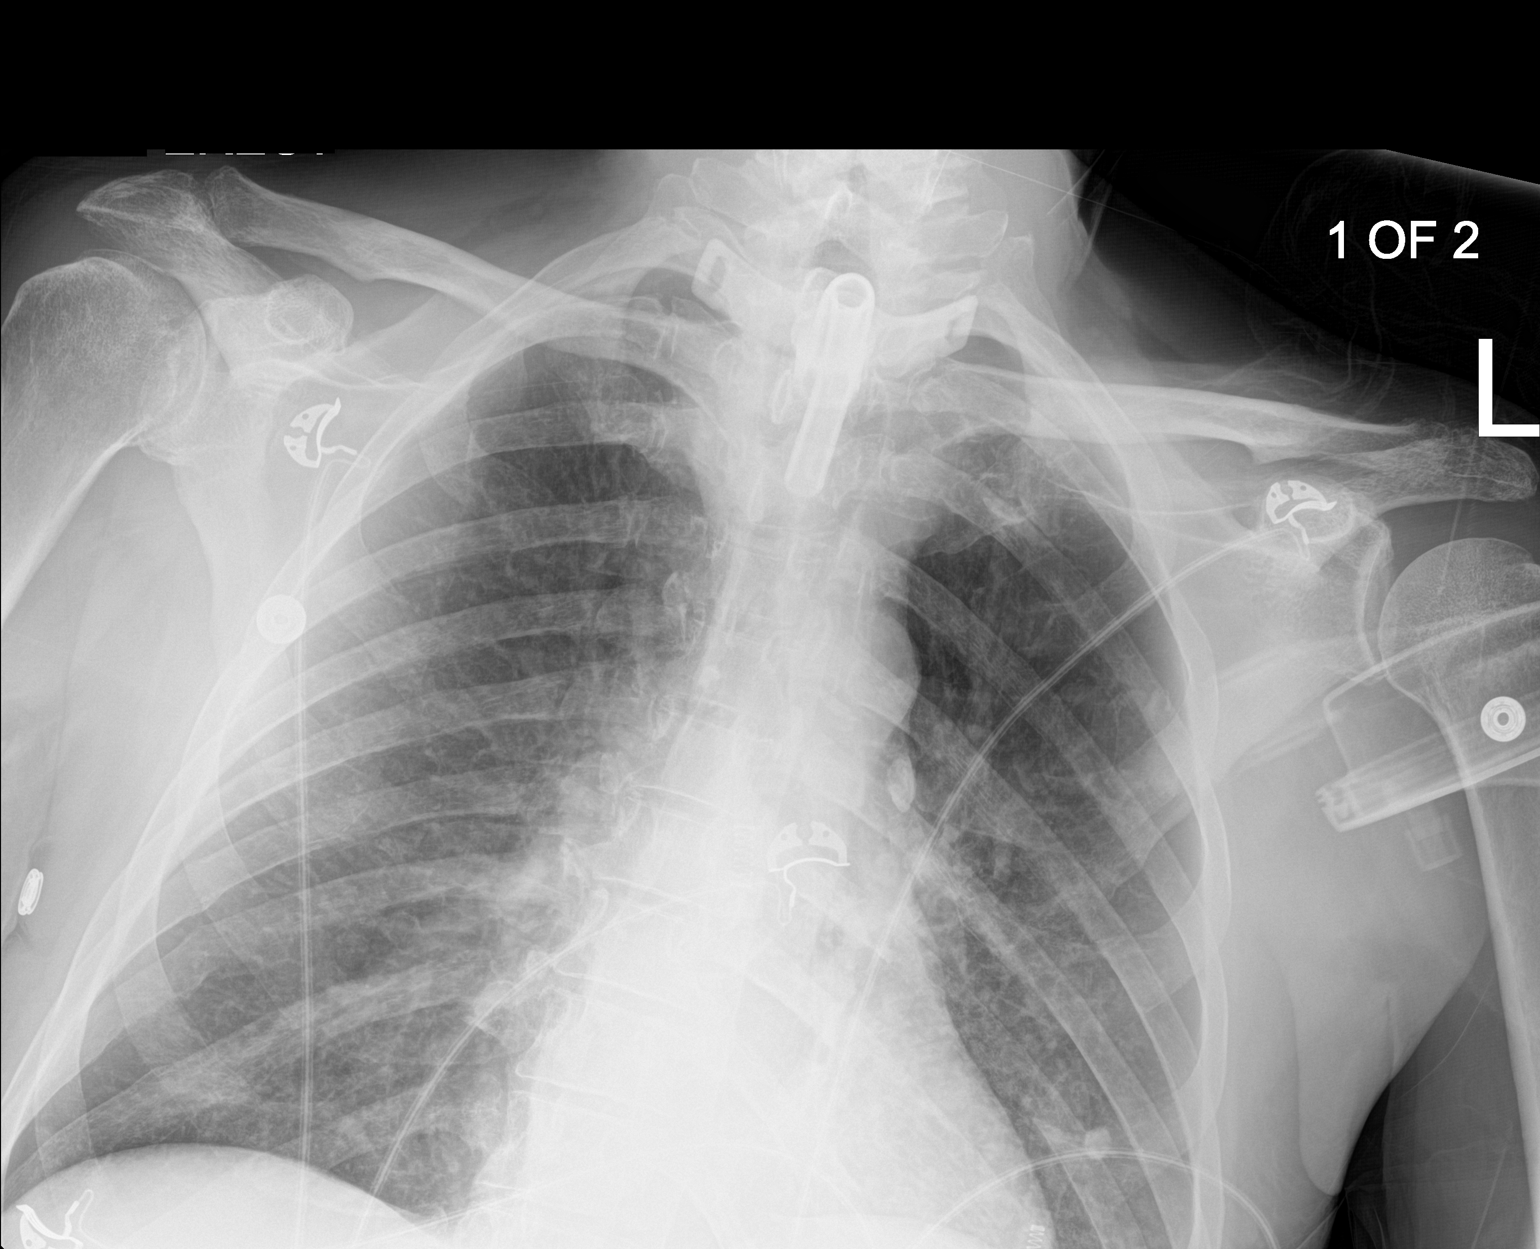

[chest ap (2 of 2)]
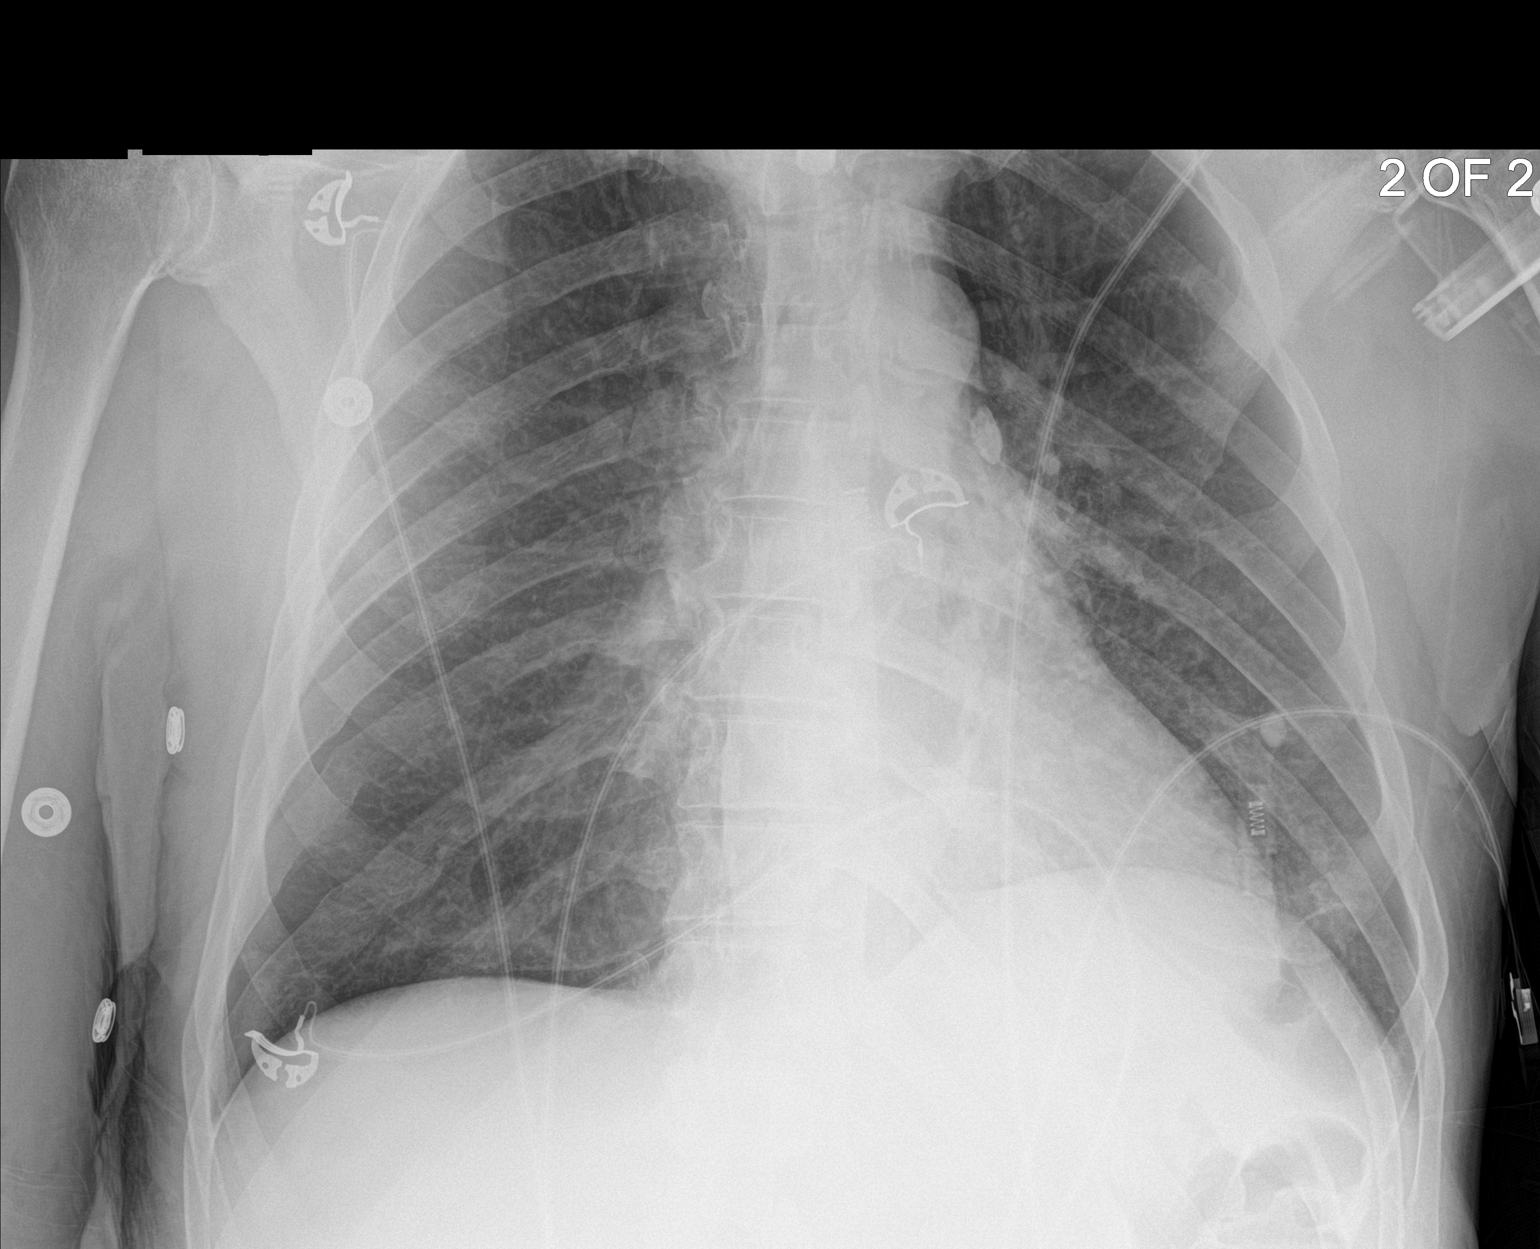

[2 of 2 positions shown; findings below may reference images not displayed]

FINDINGS: 3581 hours. Two views obtained. The tracheostomy appears unchanged.
The heart size and mediastinal contours are stable. There has been
partial clearing of the left-greater-than-right basilar airspace
opacities. No significant pleural effusion identified. There are
calcified granulomas in the left lung and calcified left hilar lymph
nodes. Old rib fractures and previous lower cervical fusion noted.
IMPRESSION: Improving left-greater-than-right basilar airspace opacities
consistent with resolving pneumonia/aspiration. No new findings.

## 2017-03-13 IMAGING — CR DG CHEST 1V PORT
2 series · 2 of 2 positions shown · non-contrast
Comparison: Chest radiograph 05/24/2015

CLINICAL DATA: Patient with hypoxia.

EXAM:
PORTABLE CHEST 1 VIEW

[AP (1 of 2)]
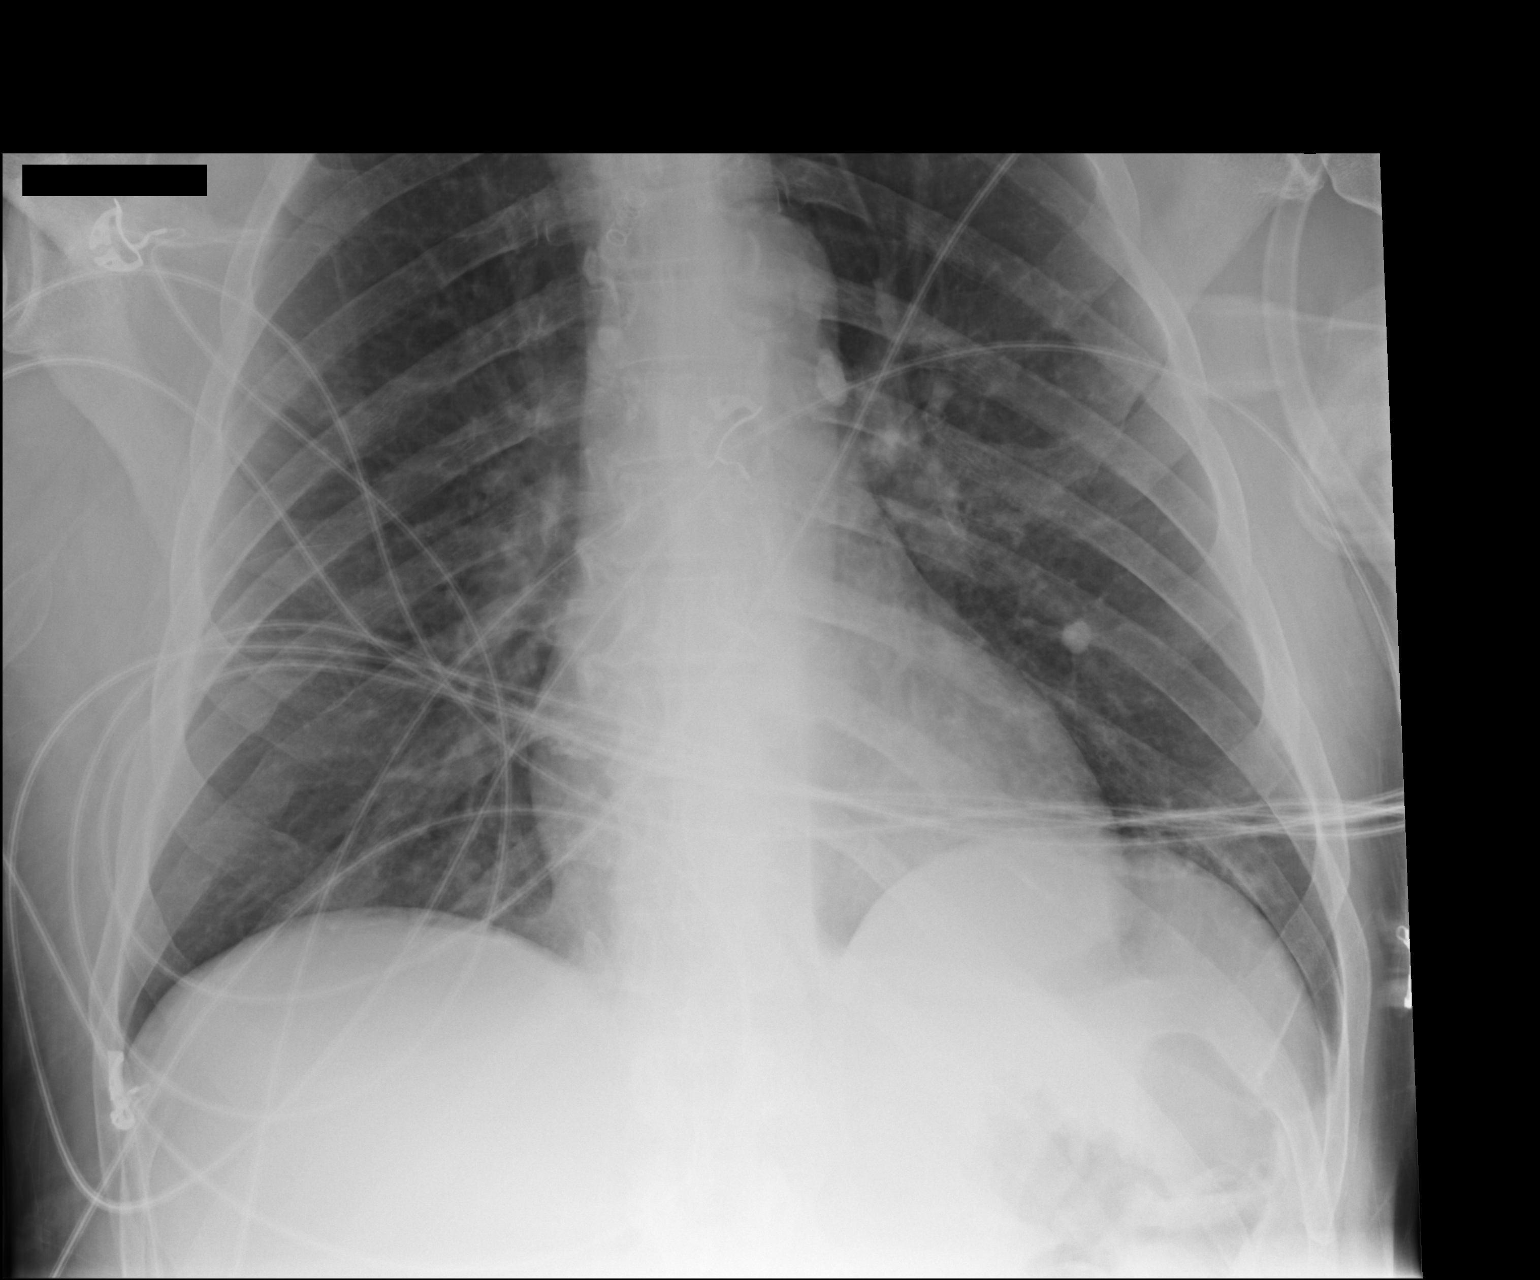

[AP (2 of 2)]
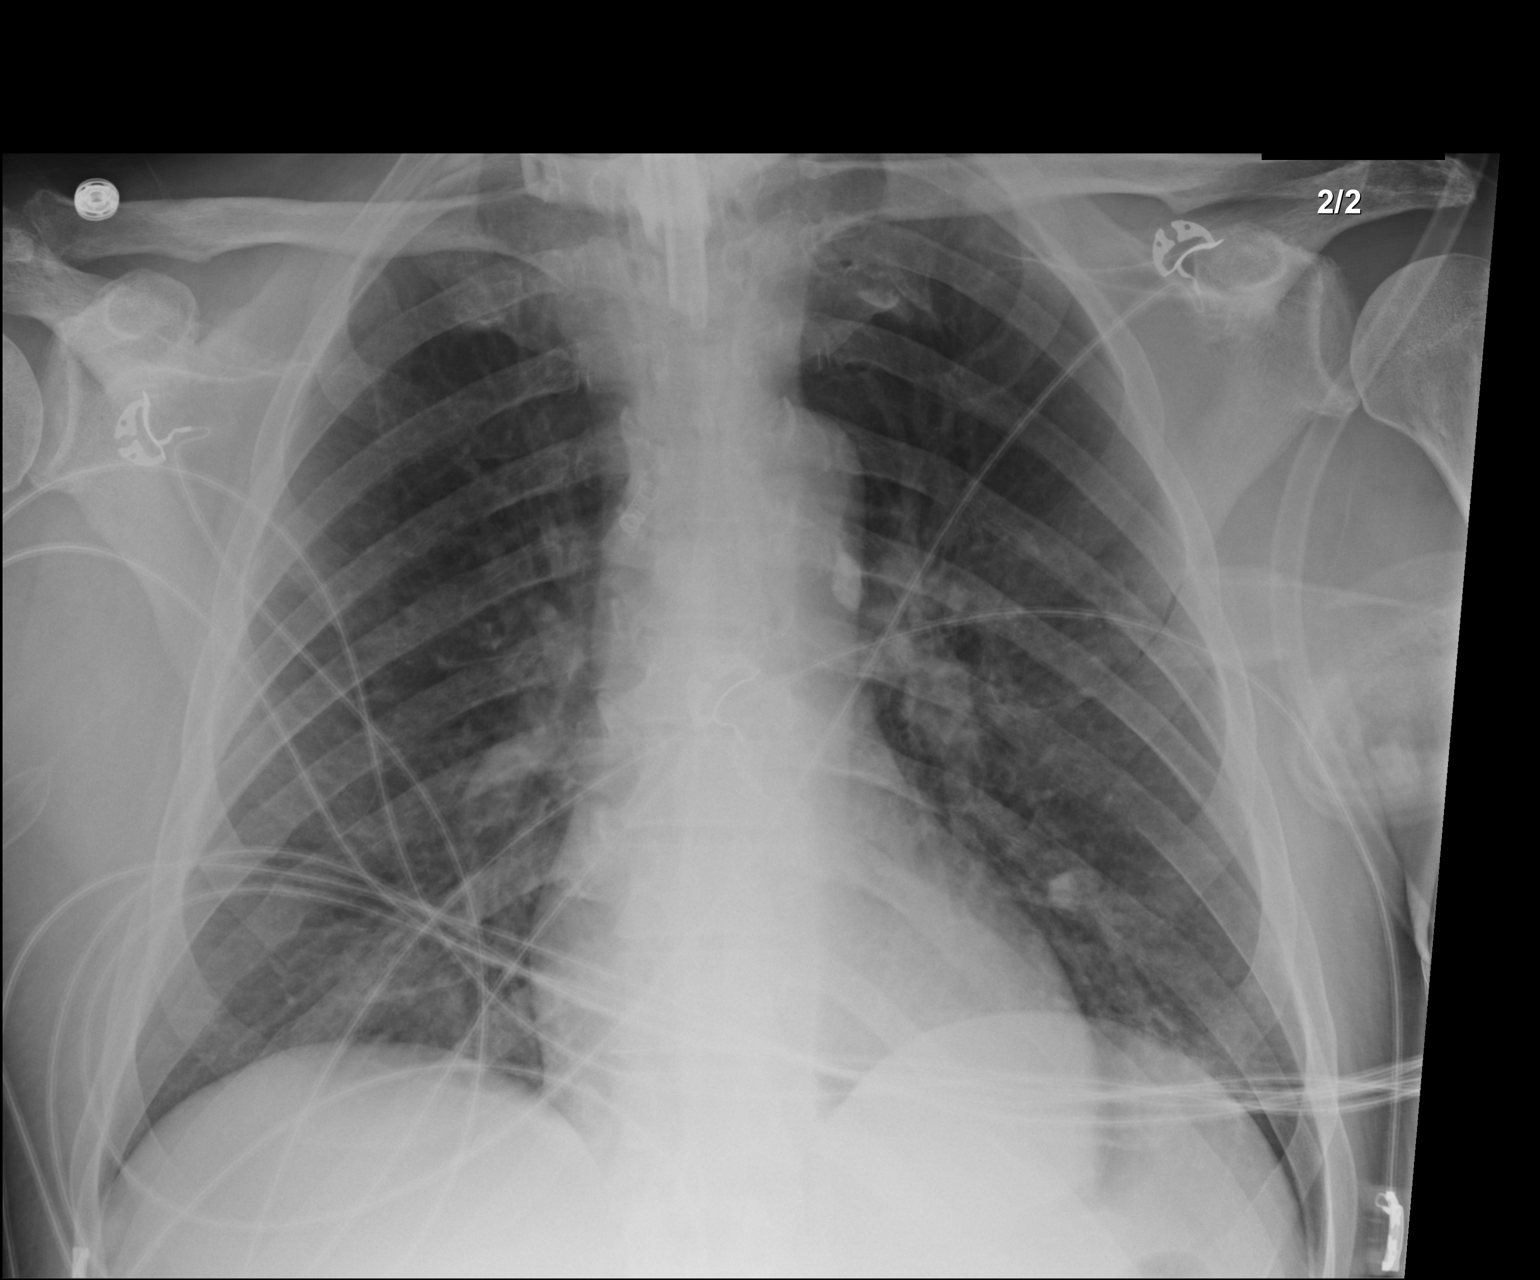

[2 of 2 positions shown; findings below may reference images not displayed]

FINDINGS: Multiple monitoring leads overlie the patient. Tracheostomy tube
terminates in the mid trachea. Stable cardiac mediastinal contours.
No consolidative pulmonary opacities. No pleural effusion or
pneumothorax. Stable calcified granuloma left lower lung. Old rib
fractures.
IMPRESSION: No acute cardiopulmonary process.

## 2017-10-19 DEATH — deceased
